# Patient Record
Sex: Male | Born: 1955
Health system: Southern US, Community
[De-identification: ages and names within clinical notes are randomized; demographics above are authoritative.]

## PROBLEM LIST (undated history)

## (undated) DIAGNOSIS — E119 Type 2 diabetes mellitus without complications: Secondary | ICD-10-CM

## (undated) DIAGNOSIS — J90 Pleural effusion, not elsewhere classified: Secondary | ICD-10-CM

## (undated) DIAGNOSIS — G8929 Other chronic pain: Secondary | ICD-10-CM

## (undated) DIAGNOSIS — M5441 Lumbago with sciatica, right side: Secondary | ICD-10-CM

## (undated) DIAGNOSIS — D151 Benign neoplasm of heart: Secondary | ICD-10-CM

## (undated) DIAGNOSIS — M25511 Pain in right shoulder: Secondary | ICD-10-CM

## (undated) DIAGNOSIS — N289 Disorder of kidney and ureter, unspecified: Secondary | ICD-10-CM

## (undated) DIAGNOSIS — E785 Hyperlipidemia, unspecified: Secondary | ICD-10-CM

## (undated) DIAGNOSIS — I10 Essential (primary) hypertension: Secondary | ICD-10-CM

## (undated) DIAGNOSIS — I4892 Unspecified atrial flutter: Secondary | ICD-10-CM

## (undated) HISTORY — DX: Other chronic pain: G89.29

## (undated) HISTORY — DX: Hyperlipidemia, unspecified: E78.5

## (undated) HISTORY — DX: Lumbago with sciatica, right side: M54.41

## (undated) HISTORY — DX: Type 2 diabetes mellitus without complications: E11.9

## (undated) HISTORY — DX: Pain in right shoulder: M25.511

## (undated) HISTORY — DX: Essential (primary) hypertension: I10

## (undated) HISTORY — DX: Benign neoplasm of heart: D15.1

## (undated) HISTORY — DX: Disorder of kidney and ureter, unspecified: N28.9

## (undated) HISTORY — PX: CARDIAC CATHETERIZATION: SHX172

## (undated) HISTORY — DX: Pleural effusion, not elsewhere classified: J90

## (undated) HISTORY — DX: Unspecified atrial flutter: I48.92

---

## 2000-01-28 ENCOUNTER — Ambulatory Visit (HOSPITAL_COMMUNITY): Admission: RE | Admit: 2000-01-28 | Discharge: 2000-01-28 | Payer: Self-pay | Admitting: *Deleted

## 2000-01-28 ENCOUNTER — Encounter: Payer: Self-pay | Admitting: *Deleted

## 2001-02-25 ENCOUNTER — Encounter: Payer: Self-pay | Admitting: Emergency Medicine

## 2001-02-25 ENCOUNTER — Encounter: Admission: RE | Admit: 2001-02-25 | Discharge: 2001-02-25 | Payer: Self-pay | Admitting: Emergency Medicine

## 2004-02-25 ENCOUNTER — Encounter: Admission: RE | Admit: 2004-02-25 | Discharge: 2004-02-25 | Payer: Self-pay | Admitting: Emergency Medicine

## 2008-11-19 ENCOUNTER — Encounter: Admission: RE | Admit: 2008-11-19 | Discharge: 2008-11-19 | Payer: Self-pay | Admitting: Cardiology

## 2008-11-30 ENCOUNTER — Ambulatory Visit (HOSPITAL_COMMUNITY): Admission: RE | Admit: 2008-11-30 | Discharge: 2008-11-30 | Payer: Self-pay | Admitting: Cardiology

## 2008-12-03 ENCOUNTER — Ambulatory Visit (HOSPITAL_COMMUNITY): Admission: RE | Admit: 2008-12-03 | Discharge: 2008-12-03 | Payer: Self-pay | Admitting: Cardiology

## 2008-12-03 ENCOUNTER — Encounter (INDEPENDENT_AMBULATORY_CARE_PROVIDER_SITE_OTHER): Payer: Self-pay | Admitting: Cardiology

## 2009-08-02 ENCOUNTER — Ambulatory Visit: Payer: Self-pay | Admitting: Gastroenterology

## 2009-08-12 ENCOUNTER — Encounter: Payer: Self-pay | Admitting: Gastroenterology

## 2009-08-12 ENCOUNTER — Ambulatory Visit: Payer: Self-pay | Admitting: Gastroenterology

## 2009-08-13 ENCOUNTER — Encounter: Payer: Self-pay | Admitting: Gastroenterology

## 2010-12-20 NOTE — Miscellaneous (Signed)
Summary: [facility]/PREVISIT/PREP  Clinical Lists Changes  Medications: Added new medication of MIRALAX   POWD (POLYETHYLENE GLYCOL 3350) As per prep  instructions. - Signed Added new medication of REGLAN 10 MG  TABS (METOCLOPRAMIDE HCL) As per prep instructions. - Signed Added new medication of DULCOLAX 5 MG  TBEC (BISACODYL) Day before procedure take 2 at 3pm and 2 at 8pm. - Signed Rx of MIRALAX   POWD (POLYETHYLENE GLYCOL 3350) As per prep  instructions.;  #255gm x 0;  Signed;  Entered by: Kyra Searles RN II;  Authorized by: Louis Meckel MD;  Method used: Electronically to [facility]*, [address], [place], [place]  [address], Ph: [phone] or [phone], Fax: [phone] Rx of REGLAN 10 MG  TABS (METOCLOPRAMIDE HCL) As per prep instructions.;  #2 x 0;  Signed;  Entered by: Kyra Searles RN II;  Authorized by: Louis Meckel MD;  Method used: Electronically to [facility]*, [address] [place], [place]  [address], Ph: [phone] or [phone], Fax: [phone] Rx of DULCOLAX 5 MG  TBEC (BISACODYL) Day before procedure take 2 at 3pm and 2 at 8pm.;  #4 x 0;  Signed;  Entered by: Kyra Searles RN II;  Authorized by: Louis Meckel MD;  Method used: Electronically to [facility]*, [address], [place], [place]  [address], Ph: [phone] or [phone], Fax: [phone] Observations: Added new observation of NKA: T (08/02/2009 13:32)    Prescriptions: DULCOLAX 5 MG  TBEC (BISACODYL) Day before procedure take 2 at 3pm and 2 at 8pm.  #4 x 0   Entered by:   Kyra Searles RN II   Authorized by:   Louis Meckel MD   Signed by:   Kyra Searles RN II on 08/02/2009   Method used:   Electronically to        [facility]* (retail)       [address] [place], [place]  [address]      Ph: [phone] or [phone]       Fax: [phone]   RxID:   [id number] REGLAN 10 MG  TABS (METOCLOPRAMIDE HCL) As per prep instructions.  #2 x 0   Entered by:   Kyra Searles RN II   Authorized by:   Louis Meckel MD   Signed by:   Kyra Searles RN II on 08/02/2009   Method used:   Electronically to        [facility]* (retail)       [address] [place], [place]  [address]       Ph: [phone] or [phone]       Fax: [phone]   RxID:   [id number] MIRALAX   POWD (POLYETHYLENE GLYCOL 3350) As per prep  instructions.  #255gm x 0   Entered by:   Kyra Searles RN II   Authorized by:   Louis Meckel MD   Signed by:   Kyra Searles RN II on 08/02/2009   Method used:   Electronically to        [facility]* (retail)       [phone] [place], [place]  [address]       Ph: [phone] or [phone]       Fax: [phone]   RxID:   [id number]

## 2011-02-24 LAB — GLUCOSE, CAPILLARY
Glucose-Capillary: 125 mg/dL — ABNORMAL HIGH (ref 70–99)
Glucose-Capillary: 95 mg/dL (ref 70–99)

## 2011-05-05 ENCOUNTER — Other Ambulatory Visit (HOSPITAL_COMMUNITY): Payer: Self-pay | Admitting: Cardiology

## 2011-05-05 DIAGNOSIS — I5189 Other ill-defined heart diseases: Secondary | ICD-10-CM

## 2011-05-09 ENCOUNTER — Other Ambulatory Visit (HOSPITAL_COMMUNITY): Payer: Self-pay | Admitting: Cardiology

## 2011-05-09 ENCOUNTER — Ambulatory Visit (HOSPITAL_COMMUNITY)
Admission: RE | Admit: 2011-05-09 | Discharge: 2011-05-09 | Disposition: A | Discharge status: Home / Self Care | Payer: 59 | Source: Ambulatory Visit | Attending: Cardiology | Admitting: Cardiology

## 2011-05-09 DIAGNOSIS — I5189 Other ill-defined heart diseases: Secondary | ICD-10-CM

## 2011-05-09 DIAGNOSIS — R222 Localized swelling, mass and lump, trunk: Secondary | ICD-10-CM

## 2011-05-09 DIAGNOSIS — R9389 Abnormal findings on diagnostic imaging of other specified body structures: Secondary | ICD-10-CM | POA: Insufficient documentation

## 2011-05-16 ENCOUNTER — Other Ambulatory Visit: Payer: Self-pay | Admitting: Cardiology

## 2011-05-16 ENCOUNTER — Ambulatory Visit
Admission: RE | Admit: 2011-05-16 | Discharge: 2011-05-16 | Disposition: A | Discharge status: Home / Self Care | Payer: 59 | Source: Ambulatory Visit | Attending: Cardiology | Admitting: Cardiology

## 2011-05-16 DIAGNOSIS — I119 Hypertensive heart disease without heart failure: Secondary | ICD-10-CM

## 2011-05-22 ENCOUNTER — Inpatient Hospital Stay (HOSPITAL_BASED_OUTPATIENT_CLINIC_OR_DEPARTMENT_OTHER)
Admission: RE | Admit: 2011-05-22 | Discharge: 2011-05-22 | Disposition: A | Discharge status: Home / Self Care | Payer: 59 | Source: Ambulatory Visit | Attending: Cardiology | Admitting: Cardiology

## 2011-05-22 DIAGNOSIS — D151 Benign neoplasm of heart: Secondary | ICD-10-CM | POA: Insufficient documentation

## 2011-05-22 DIAGNOSIS — E119 Type 2 diabetes mellitus without complications: Secondary | ICD-10-CM | POA: Insufficient documentation

## 2011-05-22 DIAGNOSIS — I119 Hypertensive heart disease without heart failure: Secondary | ICD-10-CM | POA: Insufficient documentation

## 2011-05-22 DIAGNOSIS — N289 Disorder of kidney and ureter, unspecified: Secondary | ICD-10-CM | POA: Insufficient documentation

## 2011-05-22 LAB — POCT I-STAT GLUCOSE
Glucose, Bld: 145 mg/dL — ABNORMAL HIGH (ref 70–99)
Operator id: 194801

## 2011-05-23 NOTE — Cardiovascular Report (Signed)
  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  He has known hypertensive heart disease and mild renal insufficiency as well as diabetes.  PROCEDURE:  Left heart catheterization with coronary angiograms and left ventriculogram.  SURGEON:  W. Viann Fish, MD  PROCEDURE IN DETAIL:  The [facility] and was prepped and draped in the usual manner. After Xylocaine anesthesia, a 4-French sheath was placed in the right femoral artery with a single anterior needle wall stick.  Versed 3 mg was used for sedation because of anxiety.  Angiograms were made using 4- French right and left coronary catheters and a pullback gradient was measured across the aortic valve.  The distal aortogram was done with a 4-French pigtail using 25 mL of contrast, a total of 60 mL of contrast was used for the procedure.  He tolerated it well and the sheath was removed in the holding area.  HEMODYNAMIC DATA:  Pullback pressure across the aortic valve shows an aortic pressure of 132/80, mean of 104.  Left ventricular pressure was 132/0-13.  ANGIOGRAPHIC DATA:  Left ventriculogram was not performed.  LVEDP was not elevated.  Distal aortogram reveals a widely patent aorta.  Renals appear grossly patent that are suboptimally visualized.  The distal runoff is good and there is no significant stenosis in the iliac vessels.  No evidence of abdominal aneurysm.  Coronary arteries arise and distribute normally.  There is very mild focal calcification noted in the proximal LAD at the distal left main junction.  The left main coronary artery is  normal.  The left anterior descending has a focal area of calcification with minimal narrowing at the origin of the LAD. The LAD is widely patent and is tortuous but contains no significant disease.  The circumflex contains a large intermediate branch that is mildly tortuous and contains no significant disease.  The circumflex proper contains a single marginal artery with scattered irregularities but no significant obstructive coronary disease is noted.  Right coronary artery is visualized in a single projection and is widely patent as a dominant vessel containing scattered irregularities but no significant obstructive disease.  IMPRESSION: 1. No significant obstructive coronary disease noted. 2. Widely patent distal aortogram.  RECOMMENDATIONS:  Surgery for myxoma.     Georga Hacking, M.D.     WST/MEDQ  D:  05/22/2011  T:  05/22/2011  Job:  [id number]  cc:   Salvatore Decent. Cornelius Moras, M.D. Gaspar Garbe, M.D.  Electronically Signed by Lacretia Nicks. Donnie Aho M.D. on 05/23/2011 05:10:41 PM

## 2011-05-29 ENCOUNTER — Encounter (INDEPENDENT_AMBULATORY_CARE_PROVIDER_SITE_OTHER): Payer: 59 | Admitting: Thoracic Surgery (Cardiothoracic Vascular Surgery)

## 2011-05-29 DIAGNOSIS — D487 Neoplasm of uncertain behavior of other specified sites: Secondary | ICD-10-CM

## 2011-05-30 NOTE — H&P (Signed)
HISTORY AND PHYSICAL EXAMINATION  May 29, 2011  Re:  Richard Barton, Richard Barton           DOB:  June 20, 1956  REFERRING PHYSICIAN:  Georga Hacking, MD  PRIMARY CARE PHYSICIAN:  Richard Garbe, MD  REASON FOR CONSULTATION:  Left atrial mass.  HISTORY OF PRESENT ILLNESS:  Richard Barton is a 55 year old African American male who is followed by Richard Barton, for severe hypertension with hypertensive heart disease.  He recently underwent a followup echocardiogram on May 03, 2011.  This confirmed the presence of stable left ventricular function with ejection fraction estimated 50%.  There was severe concentric left ventricular hypertrophy that was also stable in comparison with previous echocardiograms.  However, a small, mobile mass was seen in the left atrium attached to the atrial septum that had not been seen on previous echocardiograms and was suggestive of an atrial myxoma.  The patient subsequently underwent cardiac MRI on May 09, 2011.  This confirmed a small mass attached to the left atrial surface of the interatrial septum that measured 1.8 x 1.6 cm in diameter and appeared smooth and well-circumscribed, suggestive of an atrial myxoma.  No other abnormalities were noted.  Gadolinium contrast was not administered.  The patient was seen back in followup by Richard Barton, and later underwent elective cardiac catheterization on May 22, 2011.  This confirmed the absence of any significant coronary artery disease. Aortogram of the distal thoracic and abdominal aorta was notable for this absence of any significant aortoiliac occlusive disease.  The patient has now been referred to consider elective surgical resection of his left atrial mass, possibly through a right minithoracotomy approach.  REVIEW OF SYSTEMS:  GENERAL:  The patient reports normal appetite.  He has actually gained a little weight recently.  He is 5 feet, 10-1/2 inches tall, and weighs 230  pounds. CARDIAC:  The patient has stable symptoms of mild exertional shortness of breath.  He states that the symptoms are mild and somewhat sporadic. He has never had severe shortness of breath and he has never had any symptoms at rest.  He has never had any sort of pain in his chest.  He does not have any history of tachypalpitations or dizzy spells. RESPIRATORY:  Notable for some wheezing associated with physical exertion.  He denies productive cough, hemoptysis.  GASTROINTESTINAL: Negative.  The patient does take Nexium for reflux and his symptoms will recur quickly if he goes off of therapy.  Otherwise, he feels quite well and he has no difficulty swallowing.  Bowel function is regular.  He denies hematochezia, hematemesis, melena. GENITOURINARY:  The patient has known history of chronic renal insufficiency and is followed by Richard Barton.  He has no ongoing urinary symptoms. PERIPHERAL VASCULAR:  The patient does have pain in his lower legs and feet with walking that does not sound characteristic of claudication. The pain is worse when he first gets up and gets better as he moves on. He sometimes has similar pain when he is lying flat in bed and the pain gets better when he stands up. MUSCULOSKELETAL:  Notable for chronic arthritis and arthralgias particularly afflicting his left arm, both feet and both legs. NEUROLOGIC:  Negative.  The patient does have some occasional headaches. He denies any transient monocular blindness.  He denies any dizzy spells or syncope.  He denies any transient numbness or weakness involving either upper or lower extremity.  HEENT:  Notable for some gradual decrease in visual acuity  at both eyes has been slow and insidious, but seems to be worse recently.  Symptoms are bilateral. PSYCHIATRIC:  Negative. HEENT:  Negative.  PAST MEDICAL HISTORY: 1. Hypertension. 2. Type 2 diabetes mellitus. 3. Chronic renal insufficiency. 4.  Hyperlipidemia.  PAST SURGICAL HISTORY:  None.  FAMILY HISTORY:  Noncontributory.  SOCIAL HISTORY:  The patient is married and lives with his wife in [place].  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  He has a long history of heavy tobacco abuse, but he quit smoking 4 years ago.  Prior to that, he smoke between 2-1/2 and 3 packs of cigarettes daily.  He does not use alcohol regularly.  CURRENT MEDICATIONS: 1. Lantus insulin 70 units daily. 2. Glimepiride 4 mg daily. 3. Januvia 100 mg daily. 4. Bystolic 20 mg daily. 5. TriCor 145 mg daily. 6. Amlodipine 10 mg daily. 7. Nexium 40 mg daily. 8. Furosemide 40 mg daily. 9. Benazepril 40 mg daily. 10.Minoxidil 2.5 mg twice daily. 11.Mucinex as needed. 12.Benadryl as needed. 13.Excedrin as needed.  DRUG ALLERGIES:  None.  PHYSICAL EXAMINATION:  The patient is well-appearing, moderately obese African American male who appears his stated age in no acute distress. Blood pressure 154/89, pulse 72 regular, oxygen saturation 96% on room air.  HEENT exam is unrevealing.  The neck is supple.  There is no cervical nor supraclavicular lymphadenopathy.  There is no jugular venous distention.  No carotid bruits are noted.  Auscultation of the chest demonstrates clear breath sounds that are symmetrical bilaterally. No wheezes, rales, or rhonchi noted.  Cardiovascular exam reveals regular rate and rhythm.  No murmurs, rubs, or gallops are appreciated. The abdomen is moderately obese but soft, nontender.  There are no palpable masses.  Bowel sounds are present.  The extremities are warm and well perfused.  There is no lower extremity edema.  Femoral pulses are strong and palpable bilaterally.  Posterior tibial pulses are easily palpable in both lower legs at the ankle.  There is no sign of venous insufficiency.  Rectal and GU exams both deferred.  DIAGNOSTIC TEST:  Cardiac MRI performed on May 09, 2011, is reviewed. This confirms the presence of a small mass that appears to be emanating from the left atrial surface of the interatrial septum.  The mass is well-circumscribed and smooth with appearance suggestive of an atrial myxoma.  The possibility that this could represent lipoma cannot be ruled out, although the density on T1 and T2 images does not appear to be consistent with fat.  This does not appear to be consistent with mural thrombus within the left atrium.  This does not have characteristics suggestive of a malignancy or metastatic disease.  No other significant abnormalities were noted.  Cardiac catheterization performed by Richard Barton on May 22, 2011 is reviewed.  This demonstrates normal coronary artery anatomy with no significant coronary artery disease.  The abdominal aorta and iliac vessels appear free of any significant aortoiliac disease.  No other significant abnormalities were noted.  IMPRESSION:  Left atrial mass which is small and has characteristics suggestive of an atrial myxoma.  I agree that surgical resection would best be indicated both for diagnostic purposes and to prevent risk of stroke or other complications related to the mass.  A believe that the patient would be a good candidate for use of minithoracotomy approach.  PLAN:  I have discussed matters at length with the patient and his wife. The rationale for surgical  intervention has been discussed.  Alternative surgical approaches have been discussed and all of their questions have been addressed.  We tentatively planned to proceed with surgery on Friday, July 20,2 012.  The patient understands and accepts all potential associated risks and desire to proceed with surgery as described.  Salvatore Decent. Cornelius Moras, M.D. Electronically Signed  CHO/MEDQ  D:  05/29/2011  T:  05/30/2011  Job:  [id number]  cc:   Richard Barton, M.D. Richard Barton, M.D. Cecille Aver, M.D.

## 2011-06-07 ENCOUNTER — Encounter (HOSPITAL_COMMUNITY)
Admission: RE | Admit: 2011-06-07 | Discharge: 2011-06-07 | Disposition: A | Discharge status: Home / Self Care | Payer: 59 | Source: Ambulatory Visit | Attending: Thoracic Surgery (Cardiothoracic Vascular Surgery) | Admitting: Thoracic Surgery (Cardiothoracic Vascular Surgery)

## 2011-06-07 ENCOUNTER — Other Ambulatory Visit: Payer: Self-pay | Admitting: Thoracic Surgery (Cardiothoracic Vascular Surgery)

## 2011-06-07 ENCOUNTER — Ambulatory Visit (HOSPITAL_COMMUNITY)
Admission: RE | Admit: 2011-06-07 | Discharge: 2011-06-07 | Disposition: A | Discharge status: Home / Self Care | Payer: 59 | Source: Ambulatory Visit | Attending: Thoracic Surgery (Cardiothoracic Vascular Surgery) | Admitting: Thoracic Surgery (Cardiothoracic Vascular Surgery)

## 2011-06-07 DIAGNOSIS — I6529 Occlusion and stenosis of unspecified carotid artery: Secondary | ICD-10-CM | POA: Insufficient documentation

## 2011-06-07 DIAGNOSIS — Z01811 Encounter for preprocedural respiratory examination: Secondary | ICD-10-CM

## 2011-06-07 DIAGNOSIS — Z01810 Encounter for preprocedural cardiovascular examination: Secondary | ICD-10-CM

## 2011-06-07 DIAGNOSIS — R220 Localized swelling, mass and lump, head: Secondary | ICD-10-CM | POA: Insufficient documentation

## 2011-06-07 DIAGNOSIS — Z01812 Encounter for preprocedural laboratory examination: Secondary | ICD-10-CM | POA: Insufficient documentation

## 2011-06-07 LAB — URINALYSIS, ROUTINE W REFLEX MICROSCOPIC
Bilirubin Urine: NEGATIVE
Glucose, UA: NEGATIVE mg/dL
Hgb urine dipstick: NEGATIVE
Ketones, ur: NEGATIVE mg/dL
Leukocytes, UA: NEGATIVE
Nitrite: NEGATIVE
Protein, ur: NEGATIVE mg/dL
Specific Gravity, Urine: 1.017 (ref 1.005–1.030)
Urobilinogen, UA: 0.2 mg/dL (ref 0.0–1.0)
pH: 5 (ref 5.0–8.0)

## 2011-06-07 LAB — ABO/RH: ABO/RH(D): A POS

## 2011-06-07 LAB — COMPREHENSIVE METABOLIC PANEL
ALT: 35 U/L (ref 0–53)
AST: 30 U/L (ref 0–37)
Albumin: 4.5 g/dL (ref 3.5–5.2)
Alkaline Phosphatase: 50 U/L (ref 39–117)
BUN: 21 mg/dL (ref 6–23)
CO2: 23 mEq/L (ref 19–32)
Calcium: 10 mg/dL (ref 8.4–10.5)
Chloride: 106 mEq/L (ref 96–112)
Creatinine, Ser: 1.76 mg/dL — ABNORMAL HIGH (ref 0.50–1.35)
GFR calc Af Amer: 49 mL/min — ABNORMAL LOW (ref >60)
GFR calc non Af Amer: 41 mL/min — ABNORMAL LOW (ref >60)
Glucose, Bld: 126 mg/dL — ABNORMAL HIGH (ref 70–99)
Potassium: 4.4 mEq/L (ref 3.5–5.1)
Sodium: 140 mEq/L (ref 135–145)
Total Bilirubin: 0.3 mg/dL (ref 0.3–1.2)
Total Protein: 7.8 g/dL (ref 6.0–8.3)

## 2011-06-07 LAB — BLOOD GAS, ARTERIAL
Acid-Base Excess: 1.4 mmol/L (ref 0.0–2.0)
Bicarbonate: 25.1 mEq/L — ABNORMAL HIGH (ref 20.0–24.0)
Drawn by: 344381
FIO2: 0.21 %
O2 Saturation: 97.1 %
Patient temperature: 98.6
TCO2: 26.2 mmol/L (ref 0–100)
pCO2 arterial: 37.5 mmHg (ref 35.0–45.0)
pH, Arterial: 7.441 (ref 7.350–7.450)
pO2, Arterial: 86 mmHg (ref 80.0–100.0)

## 2011-06-07 LAB — SURGICAL PCR SCREEN
MRSA, PCR: NEGATIVE
Staphylococcus aureus: NEGATIVE

## 2011-06-07 LAB — CBC
HCT: 40.7 % (ref 39.0–52.0)
Hemoglobin: 14.9 g/dL (ref 13.0–17.0)
MCH: 27.5 pg (ref 26.0–34.0)
MCHC: 36.6 g/dL — ABNORMAL HIGH (ref 30.0–36.0)
MCV: 75.2 fL — ABNORMAL LOW (ref 78.0–100.0)
Platelets: 306 10*3/uL (ref 150–400)
RBC: 5.41 MIL/uL (ref 4.22–5.81)
RDW: 14.6 % (ref 11.5–15.5)
WBC: 5.6 10*3/uL (ref 4.0–10.5)

## 2011-06-07 LAB — PROTIME-INR
INR: 1 (ref 0.00–1.49)
Prothrombin Time: 13.4 seconds (ref 11.6–15.2)

## 2011-06-07 LAB — TYPE AND SCREEN
ABO/RH(D): A POS
Antibody Screen: NEGATIVE

## 2011-06-07 LAB — APTT: aPTT: 30 seconds (ref 24–37)

## 2011-06-08 LAB — HEMOGLOBIN A1C
Hgb A1c MFr Bld: 9.4 % — ABNORMAL HIGH (ref <5.7)
Mean Plasma Glucose: 223 mg/dL — ABNORMAL HIGH (ref <117)

## 2011-06-09 ENCOUNTER — Other Ambulatory Visit: Payer: Self-pay | Admitting: Thoracic Surgery (Cardiothoracic Vascular Surgery)

## 2011-06-09 ENCOUNTER — Inpatient Hospital Stay (HOSPITAL_COMMUNITY): Payer: 59

## 2011-06-09 ENCOUNTER — Inpatient Hospital Stay (HOSPITAL_COMMUNITY)
Admission: RE | Admit: 2011-06-09 | Discharge: 2011-06-15 | DRG: 229 | Disposition: A | Discharge status: Home Health | Payer: 59 | Source: Ambulatory Visit | Attending: Thoracic Surgery (Cardiothoracic Vascular Surgery) | Admitting: Thoracic Surgery (Cardiothoracic Vascular Surgery)

## 2011-06-09 DIAGNOSIS — Y838 Other surgical procedures as the cause of abnormal reaction of the patient, or of later complication, without mention of misadventure at the time of the procedure: Secondary | ICD-10-CM | POA: Diagnosis not present

## 2011-06-09 DIAGNOSIS — J449 Chronic obstructive pulmonary disease, unspecified: Secondary | ICD-10-CM | POA: Diagnosis present

## 2011-06-09 DIAGNOSIS — N189 Chronic kidney disease, unspecified: Secondary | ICD-10-CM | POA: Diagnosis present

## 2011-06-09 DIAGNOSIS — Z7982 Long term (current) use of aspirin: Secondary | ICD-10-CM

## 2011-06-09 DIAGNOSIS — I498 Other specified cardiac arrhythmias: Secondary | ICD-10-CM | POA: Diagnosis not present

## 2011-06-09 DIAGNOSIS — I059 Rheumatic mitral valve disease, unspecified: Secondary | ICD-10-CM | POA: Diagnosis present

## 2011-06-09 DIAGNOSIS — I519 Heart disease, unspecified: Secondary | ICD-10-CM | POA: Diagnosis not present

## 2011-06-09 DIAGNOSIS — N289 Disorder of kidney and ureter, unspecified: Secondary | ICD-10-CM | POA: Diagnosis not present

## 2011-06-09 DIAGNOSIS — D487 Neoplasm of uncertain behavior of other specified sites: Secondary | ICD-10-CM

## 2011-06-09 DIAGNOSIS — Z794 Long term (current) use of insulin: Secondary | ICD-10-CM

## 2011-06-09 DIAGNOSIS — I1310 Hypertensive heart and chronic kidney disease without heart failure, with stage 1 through stage 4 chronic kidney disease, or unspecified chronic kidney disease: Secondary | ICD-10-CM | POA: Diagnosis present

## 2011-06-09 DIAGNOSIS — K219 Gastro-esophageal reflux disease without esophagitis: Secondary | ICD-10-CM | POA: Diagnosis present

## 2011-06-09 DIAGNOSIS — D151 Benign neoplasm of heart: Principal | ICD-10-CM | POA: Diagnosis present

## 2011-06-09 DIAGNOSIS — E119 Type 2 diabetes mellitus without complications: Secondary | ICD-10-CM | POA: Diagnosis present

## 2011-06-09 DIAGNOSIS — D62 Acute posthemorrhagic anemia: Secondary | ICD-10-CM | POA: Diagnosis not present

## 2011-06-09 DIAGNOSIS — J4489 Other specified chronic obstructive pulmonary disease: Secondary | ICD-10-CM | POA: Diagnosis present

## 2011-06-09 HISTORY — PX: OTHER SURGICAL HISTORY: SHX169

## 2011-06-09 LAB — CBC
HCT: 38.1 % — ABNORMAL LOW (ref 39.0–52.0)
HCT: 39 % (ref 39.0–52.0)
Hemoglobin: 13.3 g/dL (ref 13.0–17.0)
Hemoglobin: 13.9 g/dL (ref 13.0–17.0)
MCH: 26.4 pg (ref 26.0–34.0)
MCH: 26.9 pg (ref 26.0–34.0)
MCHC: 34.9 g/dL (ref 30.0–36.0)
MCHC: 35.6 g/dL (ref 30.0–36.0)
MCV: 75.7 fL — ABNORMAL LOW (ref 78.0–100.0)
MCV: 75.4 fL — ABNORMAL LOW (ref 78.0–100.0)
Platelets: 217 10*3/uL (ref 150–400)
Platelets: 276 10*3/uL (ref 150–400)
RBC: 5.03 MIL/uL (ref 4.22–5.81)
RBC: 5.17 MIL/uL (ref 4.22–5.81)
RDW: 14.6 % (ref 11.5–15.5)
RDW: 14.6 % (ref 11.5–15.5)
WBC: 11.7 10*3/uL — ABNORMAL HIGH (ref 4.0–10.5)
WBC: 18.4 10*3/uL — ABNORMAL HIGH (ref 4.0–10.5)

## 2011-06-09 LAB — GLUCOSE, CAPILLARY
Glucose-Capillary: 155 mg/dL — ABNORMAL HIGH (ref 70–99)
Glucose-Capillary: 121 mg/dL — ABNORMAL HIGH (ref 70–99)
Glucose-Capillary: 107 mg/dL — ABNORMAL HIGH (ref 70–99)
Glucose-Capillary: 124 mg/dL — ABNORMAL HIGH (ref 70–99)
Glucose-Capillary: 127 mg/dL — ABNORMAL HIGH (ref 70–99)
Glucose-Capillary: 135 mg/dL — ABNORMAL HIGH (ref 70–99)
Glucose-Capillary: 120 mg/dL — ABNORMAL HIGH (ref 70–99)

## 2011-06-09 LAB — POCT I-STAT 3, ART BLOOD GAS (G3+)
Acid-base deficit: 3 mmol/L — ABNORMAL HIGH (ref 0.0–2.0)
Acid-base deficit: 4 mmol/L — ABNORMAL HIGH (ref 0.0–2.0)
Acid-base deficit: 3 mmol/L — ABNORMAL HIGH (ref 0.0–2.0)
Acid-base deficit: 2 mmol/L (ref 0.0–2.0)
Acid-base deficit: 3 mmol/L — ABNORMAL HIGH (ref 0.0–2.0)
Bicarbonate: 23.5 mEq/L (ref 20.0–24.0)
Bicarbonate: 24.3 mEq/L — ABNORMAL HIGH (ref 20.0–24.0)
Bicarbonate: 24 mEq/L (ref 20.0–24.0)
Bicarbonate: 24.9 mEq/L — ABNORMAL HIGH (ref 20.0–24.0)
Bicarbonate: 23.1 mEq/L (ref 20.0–24.0)
O2 Saturation: 100 %
O2 Saturation: 98 %
O2 Saturation: 96 %
O2 Saturation: 97 %
O2 Saturation: 98 %
Patient temperature: 35.8
Patient temperature: 37
Patient temperature: 37.3
TCO2: 25 mmol/L (ref 0–100)
TCO2: 26 mmol/L (ref 0–100)
TCO2: 25 mmol/L (ref 0–100)
TCO2: 26 mmol/L (ref 0–100)
TCO2: 24 mmol/L (ref 0–100)
pCO2 arterial: 45.4 mmHg — ABNORMAL HIGH (ref 35.0–45.0)
pCO2 arterial: 59.3 mmHg (ref 35.0–45.0)
pCO2 arterial: 44.8 mmHg (ref 35.0–45.0)
pCO2 arterial: 50.8 mmHg — ABNORMAL HIGH (ref 35.0–45.0)
pCO2 arterial: 43.4 mmHg (ref 35.0–45.0)
pH, Arterial: 7.321 — ABNORMAL LOW (ref 7.350–7.450)
pH, Arterial: 7.221 — ABNORMAL LOW (ref 7.350–7.450)
pH, Arterial: 7.33 — ABNORMAL LOW (ref 7.350–7.450)
pH, Arterial: 7.299 — ABNORMAL LOW (ref 7.350–7.450)
pH, Arterial: 7.336 — ABNORMAL LOW (ref 7.350–7.450)
pO2, Arterial: 261 mmHg — ABNORMAL HIGH (ref 80.0–100.0)
pO2, Arterial: 133 mmHg — ABNORMAL HIGH (ref 80.0–100.0)
pO2, Arterial: 83 mmHg (ref 80.0–100.0)
pO2, Arterial: 104 mmHg — ABNORMAL HIGH (ref 80.0–100.0)
pO2, Arterial: 108 mmHg — ABNORMAL HIGH (ref 80.0–100.0)

## 2011-06-09 LAB — POCT I-STAT 4, (NA,K, GLUC, HGB,HCT)
Glucose, Bld: 209 mg/dL — ABNORMAL HIGH (ref 70–99)
Glucose, Bld: 127 mg/dL — ABNORMAL HIGH (ref 70–99)
Glucose, Bld: 149 mg/dL — ABNORMAL HIGH (ref 70–99)
Glucose, Bld: 163 mg/dL — ABNORMAL HIGH (ref 70–99)
Glucose, Bld: 207 mg/dL — ABNORMAL HIGH (ref 70–99)
Glucose, Bld: 156 mg/dL — ABNORMAL HIGH (ref 70–99)
HCT: 39 % (ref 39.0–52.0)
HCT: 29 % — ABNORMAL LOW (ref 39.0–52.0)
HCT: 32 % — ABNORMAL LOW (ref 39.0–52.0)
HCT: 33 % — ABNORMAL LOW (ref 39.0–52.0)
HCT: 32 % — ABNORMAL LOW (ref 39.0–52.0)
HCT: 42 % (ref 39.0–52.0)
Hemoglobin: 13.3 g/dL (ref 13.0–17.0)
Hemoglobin: 10.9 g/dL — ABNORMAL LOW (ref 13.0–17.0)
Hemoglobin: 9.9 g/dL — ABNORMAL LOW (ref 13.0–17.0)
Hemoglobin: 11.2 g/dL — ABNORMAL LOW (ref 13.0–17.0)
Hemoglobin: 10.9 g/dL — ABNORMAL LOW (ref 13.0–17.0)
Hemoglobin: 14.3 g/dL (ref 13.0–17.0)
Potassium: 4 mEq/L (ref 3.5–5.1)
Potassium: 3.1 mEq/L — ABNORMAL LOW (ref 3.5–5.1)
Potassium: 4.7 mEq/L (ref 3.5–5.1)
Potassium: 5.7 mEq/L — ABNORMAL HIGH (ref 3.5–5.1)
Potassium: 5.8 mEq/L — ABNORMAL HIGH (ref 3.5–5.1)
Potassium: 4.4 mEq/L (ref 3.5–5.1)
Sodium: 139 mEq/L (ref 135–145)
Sodium: 135 mEq/L (ref 135–145)
Sodium: 145 mEq/L (ref 135–145)
Sodium: 137 mEq/L (ref 135–145)
Sodium: 136 mEq/L (ref 135–145)
Sodium: 139 mEq/L (ref 135–145)

## 2011-06-09 LAB — POCT I-STAT, CHEM 8
BUN: 23 mg/dL (ref 6–23)
Calcium, Ion: 1.25 mmol/L (ref 1.12–1.32)
Chloride: 111 mEq/L (ref 96–112)
Creatinine, Ser: 1.7 mg/dL — ABNORMAL HIGH (ref 0.50–1.35)
Glucose, Bld: 129 mg/dL — ABNORMAL HIGH (ref 70–99)
HCT: 40 % (ref 39.0–52.0)
Hemoglobin: 13.6 g/dL (ref 13.0–17.0)
Potassium: 3.8 mEq/L (ref 3.5–5.1)
Sodium: 138 mEq/L (ref 135–145)
TCO2: 23 mmol/L (ref 0–100)

## 2011-06-09 LAB — PROTIME-INR
INR: 1.34 (ref 0.00–1.49)
Prothrombin Time: 16.8 seconds — ABNORMAL HIGH (ref 11.6–15.2)

## 2011-06-09 LAB — PLATELET COUNT: Platelets: 240 10*3/uL (ref 150–400)

## 2011-06-09 LAB — MAGNESIUM: Magnesium: 3.1 mg/dL — ABNORMAL HIGH (ref 1.5–2.5)

## 2011-06-09 LAB — CREATININE, SERUM
Creatinine, Ser: 1.7 mg/dL — ABNORMAL HIGH (ref 0.50–1.35)
GFR calc Af Amer: 51 mL/min — ABNORMAL LOW (ref >60)
GFR calc non Af Amer: 43 mL/min — ABNORMAL LOW (ref >60)

## 2011-06-09 LAB — HEMOGLOBIN AND HEMATOCRIT, BLOOD
HCT: 30.6 % — ABNORMAL LOW (ref 39.0–52.0)
Hemoglobin: 10.9 g/dL — ABNORMAL LOW (ref 13.0–17.0)

## 2011-06-09 LAB — APTT: aPTT: 31 seconds (ref 24–37)

## 2011-06-10 ENCOUNTER — Inpatient Hospital Stay (HOSPITAL_COMMUNITY): Payer: 59

## 2011-06-10 DIAGNOSIS — E1165 Type 2 diabetes mellitus with hyperglycemia: Secondary | ICD-10-CM

## 2011-06-10 DIAGNOSIS — IMO0001 Reserved for inherently not codable concepts without codable children: Secondary | ICD-10-CM

## 2011-06-10 LAB — GLUCOSE, CAPILLARY
Glucose-Capillary: 104 mg/dL — ABNORMAL HIGH (ref 70–99)
Glucose-Capillary: 104 mg/dL — ABNORMAL HIGH (ref 70–99)
Glucose-Capillary: 108 mg/dL — ABNORMAL HIGH (ref 70–99)
Glucose-Capillary: 108 mg/dL — ABNORMAL HIGH (ref 70–99)
Glucose-Capillary: 114 mg/dL — ABNORMAL HIGH (ref 70–99)
Glucose-Capillary: 134 mg/dL — ABNORMAL HIGH (ref 70–99)
Glucose-Capillary: 145 mg/dL — ABNORMAL HIGH (ref 70–99)
Glucose-Capillary: 148 mg/dL — ABNORMAL HIGH (ref 70–99)
Glucose-Capillary: 160 mg/dL — ABNORMAL HIGH (ref 70–99)
Glucose-Capillary: 161 mg/dL — ABNORMAL HIGH (ref 70–99)
Glucose-Capillary: 187 mg/dL — ABNORMAL HIGH (ref 70–99)
Glucose-Capillary: 88 mg/dL (ref 70–99)
Glucose-Capillary: 91 mg/dL (ref 70–99)
Glucose-Capillary: 97 mg/dL (ref 70–99)
Glucose-Capillary: 99 mg/dL (ref 70–99)

## 2011-06-10 LAB — CBC
HCT: 36.6 % — ABNORMAL LOW (ref 39.0–52.0)
HCT: 34.8 % — ABNORMAL LOW (ref 39.0–52.0)
Hemoglobin: 13 g/dL (ref 13.0–17.0)
Hemoglobin: 12.3 g/dL — ABNORMAL LOW (ref 13.0–17.0)
MCH: 27 pg (ref 26.0–34.0)
MCH: 27.1 pg (ref 26.0–34.0)
MCHC: 35.5 g/dL (ref 30.0–36.0)
MCHC: 35.3 g/dL (ref 30.0–36.0)
MCV: 75.9 fL — ABNORMAL LOW (ref 78.0–100.0)
MCV: 76.7 fL — ABNORMAL LOW (ref 78.0–100.0)
Platelets: 228 10*3/uL (ref 150–400)
Platelets: 229 10*3/uL (ref 150–400)
RBC: 4.82 MIL/uL (ref 4.22–5.81)
RBC: 4.54 MIL/uL (ref 4.22–5.81)
RDW: 14.9 % (ref 11.5–15.5)
RDW: 15.2 % (ref 11.5–15.5)
WBC: 14.8 10*3/uL — ABNORMAL HIGH (ref 4.0–10.5)
WBC: 16.3 10*3/uL — ABNORMAL HIGH (ref 4.0–10.5)

## 2011-06-10 LAB — POCT I-STAT, CHEM 8
BUN: 36 mg/dL — ABNORMAL HIGH (ref 6–23)
Calcium, Ion: 1.11 mmol/L — ABNORMAL LOW (ref 1.12–1.32)
Chloride: 99 mEq/L (ref 96–112)
Creatinine, Ser: 2.3 mg/dL — ABNORMAL HIGH (ref 0.50–1.35)
Glucose, Bld: 185 mg/dL — ABNORMAL HIGH (ref 70–99)
HCT: 39 % (ref 39.0–52.0)
Hemoglobin: 13.3 g/dL (ref 13.0–17.0)
Potassium: 4.2 mEq/L (ref 3.5–5.1)
Sodium: 137 mEq/L (ref 135–145)
TCO2: 24 mmol/L (ref 0–100)

## 2011-06-10 LAB — BASIC METABOLIC PANEL
BUN: 28 mg/dL — ABNORMAL HIGH (ref 6–23)
CO2: 25 mEq/L (ref 19–32)
Calcium: 8.3 mg/dL — ABNORMAL LOW (ref 8.4–10.5)
Chloride: 104 mEq/L (ref 96–112)
Creatinine, Ser: 1.73 mg/dL — ABNORMAL HIGH (ref 0.50–1.35)
GFR calc Af Amer: 50 mL/min — ABNORMAL LOW (ref >60)
GFR calc non Af Amer: 42 mL/min — ABNORMAL LOW (ref >60)
Glucose, Bld: 148 mg/dL — ABNORMAL HIGH (ref 70–99)
Potassium: 4.1 mEq/L (ref 3.5–5.1)
Sodium: 138 mEq/L (ref 135–145)

## 2011-06-10 LAB — CREATININE, SERUM
Creatinine, Ser: 2.05 mg/dL — ABNORMAL HIGH (ref 0.50–1.35)
GFR calc Af Amer: 41 mL/min — ABNORMAL LOW (ref >60)
GFR calc non Af Amer: 34 mL/min — ABNORMAL LOW (ref >60)

## 2011-06-10 LAB — POCT I-STAT 4, (NA,K, GLUC, HGB,HCT)
Glucose, Bld: 183 mg/dL — ABNORMAL HIGH (ref 70–99)
HCT: 43 % (ref 39.0–52.0)
Hemoglobin: 14.6 g/dL (ref 13.0–17.0)
Potassium: 3.8 mEq/L (ref 3.5–5.1)
Sodium: 140 mEq/L (ref 135–145)

## 2011-06-10 LAB — MAGNESIUM
Magnesium: 2.8 mg/dL — ABNORMAL HIGH (ref 1.5–2.5)
Magnesium: 2.7 mg/dL — ABNORMAL HIGH (ref 1.5–2.5)

## 2011-06-10 NOTE — Op Note (Signed)
NAMEJASMOND, Richard Barton                 ACCOUNT NO.:  [id number]  MEDICAL RECORD NO.:  [id number]  LOCATION:  2315                         FACILITY:  [facility]  PHYSICIAN:  Salvatore Decent. Cornelius Moras, M.D. DATE OF BIRTH:  Dec 28, 1955  DATE OF PROCEDURE:  06/09/2011 DATE OF DISCHARGE:                              OPERATIVE REPORT   PREOPERATIVE DIAGNOSIS:  Left atrial mass.  POSTOPERATIVE DIAGNOSIS:  Left atrial myxoma.  PROCEDURE:  Right miniature thoracotomy for resection of left atrial myxoma.  SURGEON:  Salvatore Decent. Cornelius Moras, MD  ASSISTANT:  Rowe Clack, PAC  ANESTHESIOLOGIST:  Germaine Pomfret, MD  BRIEF CLINICAL NOTE:  The patient is a 55 year old African American male who is followed by Dr. Viann Fish for severe hypertension and hypertensive heart disease.  A 2D echocardiogram performed in June 2012, revealed a mass in the left atrium, suspicious for atrial myxoma. Cardiac MRI confirmed the presence of a small, well-circumscribed mass that appeared to be coming off the interatrial septum with anatomical characteristics, consistent with atrial myxoma.  Cardiac catheterization was performed, revealing no significant coronary artery disease.  A full consultation note has been dictated previously.  The patient has been counseled regarding the indications, risks, and potential benefits of the surgery.  Alternative treatment strategies have been discussed.  The patient understands and accepts all associated risks of surgery and desires to proceed as described.  OPERATIVE FINDINGS: 1. Small mass arising from the inferior rim of the fossa ovalis with     preliminary frozen section histology, consistent with benign atrial     myxoma. 2. Normal left ventricular systolic function. 3. Moderate left ventricular hypertrophy. 4. Mild mitral regurgitation.  OPERATIVE PROCEDURE IN DETAIL:  The patient was brought to the operating room on the above-mentioned date and central monitoring was  established by the Anesthesia Team under the care and direction of Dr. Jairo Ben.  Specifically, a Swan-Ganz catheter was placed through the left internal jugular approach.  A radial arterial line was placed. Intravenous antibiotics were administered.  The patient was placed in the supine position on the operating table.  General endotracheal anesthesia was induced uneventfully.  The patient was initially intubated with a dual-lumen endotracheal tube.  A Foley catheter was placed.  The patient was positioned with a soft roll behind the right scapula and the neck gently extended and turned towards the left.  The patient's right neck, chest, abdomen, both groins, and both lower extremities were prepared and draped in sterile manner.  Baseline transesophageal echocardiogram was performed by Dr. Jean Rosenthal. This confirmed the presence of mass emanating from the inferior rim of the fossa ovalis which is smooth and well circumscribed with anatomical characteristics, consistent with atrial myxoma.  There is mild central mitral regurgitation.  There is normal left ventricular systolic function with moderate left ventricular hypertrophy.  No other significant abnormalities were noted.  A small incision was made in the right inguinal crease and the anterior surface of the right common femoral artery and right common femoral vein were dissected through the incision.  The femoral artery is normal in appearance.  Single lung ventilation is begun.  Right miniature anterolateral thoracotomy incision was performed.  The incision was placed just above and just lateral to the right nipple.  The pectoralis major muscle was initially retracted medially and preserved.  The right pleural space was entered through the third intercostal space.  Due to the patient's extremely large and well-developed pectoralis musculature, the incision was extended medially to facilitate exposure without need to divide  one of the ribs.  A soft tissue retractor was placed.  Two 11-mm ports are placed inferiorly through separate stab incisions.  The right pleural space was insufflated continuously with carbon dioxide gas through the posterior port.  A pledgeted sutures placed in the dome of the right hemidiaphragm and retracted inferiorly to facilitate exposure.  A longitudinal incision was made in the pericardium 2 cm anterior to the phrenic nerve.  Silk traction sutures were placed on either side for exposure.  The patient was placed in Trendelenburg position.  The right internal jugular vein was cannulated with Seldinger technique and a flexible guidewire was advanced into the right atrium.  The patient was heparinized systemically.  Pursestring sutures were placed on the anterior surface of the right common femoral vein and the right common femoral artery.  The right common femoral vein was cannulated with Seldinger technique and transesophageal echocardiogram guidance was utilized to pass the guidewire up through the inferior vena cava until the guidewire passes through the right atrium up in to the superior vena cava.  The femoral vein was dilated with serial dilators and cannulated with a 22-French long femoral venous cannula.  The femoral artery was cannulated with Seldinger technique and flexible guidewire was advanced until it can be appreciated intraluminally in the descending thoracic aorta.  The femoral artery was dilated with serial dilators and cannulated with an 18-French femoral arterial cannula.  The right internal jugular vein was dilated with serial dilators and cannulated with a 14-French pediatric femoral venous cannula.  Cardiopulmonary bypass was begun.  Vacuum assist venous drainage was utilized.  Venous drainage and exposure is notably excellent.  Umbilical tapes are placed around the superior vena cava and the inferior vena cava.  An antegrade cardioplegic cannula was placed  directly in the ascending aorta.  The patient was cooled to 30 degrees systemic temperature.  The aortic crossclamp was applied and cold blood cardioplegia was delivered in antegrade fashion through the aortic root. The initial cardioplegic arrest was rapid with early diastolic arrest. Repeat doses of cardioplegia are administered every 20-30 minutes throughout the entire crossclamp portion of the operation to maintain completely flat electrocardiogram.  A small left atriotomy incision was performed posteriorly through the interatrial groove.  Through this incision, the left atrial mass is identified from within the left atrium and its position along the inferior rim of the fossa ovalis was clearly identified.  A sump drain was placed through the left atrium into the mitral valve to serve as a vent.  The inferior vena cava cannula was pulled down until the tip was just below the entry of the right atrium and the umbilical tapes were secured.  An oblique right atriotomy incision was performed.  An incision was made in the fossa ovalis along the superior rim just above lateral to the left atrial mass.  The left atrial mass is now excised completely along with a circular button of the interatrial septum which comprises the entire base of the stalk of the mass itself.  Once the mass was removed, it is sent to Pathology for routine histology and frozen section.  Preliminary frozen section histology confirms  findings consistent with benign atrial myxoma.  After the mass has been excised, a circular defect in the interatrial septum is carefully inspected to make sure that no residual myxomas left behind.  A portion of the patient's autologous pericardium was soaked in 0.65% glutaraldehyde solution for 3 minutes and then rinsed per protocol.  A portion of this autologous pericardium was then trimmed to an elliptical shape and this is utilized to close the defect in the interatrial septum.  The  patch is sewn in place with running 4-0 Prolene suture.  The left atriotomy incision was closed with a two-layer closure of running 3-0 Prolene suture.  One final dose of warm hot shot cardioplegia is administered.  The aortic crossclamp was removed after a total crossclamp time of 60 minutes.  The heart began to beat spontaneously without need for cardioversion.  The right atriotomy incision was closed with a two-layer closure of running 3-0 Prolene suture.  The umbilical tapes were removed.  The lungs were ventilated and the heart allowed to fill after which time the left ventricular vent was removed.  The lungs were ventilated and heart allowed to fill while transesophageal echocardiogram was inspected.  The anterior atrial septum appears perfectly intact with no sign of any residual interatrial communication.  The patient is rewarmed to 37 degrees centigrade temperature.  The antegrade cardioplegic cannula was removed.  The patient was weaned from cardiopulmonary bypass without difficulty after placement of epicardial pacing wires on the right ventricular free wall into the right atrial appendage.  The patient's rhythm at separation from bypass was junctional bradycardia.  AV sequential pacing was employed.  No inotropic support was required.  Total cardiopulmonary bypass time for the operation was 115 minutes.  Followup transesophageal echocardiogram performed by Dr. Jean Rosenthal after separation from bypass demonstrates normal left ventricular function.  The interatrial septum appears perfectly intact with no sign of any interatrial shunt or communication. There remains mild mitral regurgitation.  There is mild tricuspid regurgitation.  No other abnormalities are noted.  Protamine was administered to reverse the anticoagulation.  The femoral arterial and venous cannula are removed uneventfully.  There is a palpable pulse in the distal right femoral artery.  The right internal jugular  cannula was removed and manual pressure was held on the right neck for 30 minutes.  Single lung ventilation was begun.  The left atriotomy incision was inspected for hemostasis.  The pericardial space was drained with a 28- French Bard chest tube placed through the anterior port incision.  The pericardium was closed laterally using a patch of CorMatrix bovine submucosal tissue patch.  The right pleural space was irrigated with saline solution and inspected for hemostasis.  The right pleural space was drained with a 28-French Bard chest tube placed through the posterior port incision.  The minithoracotomy was closed in multiple layers in routine fashion.  The groin incision was closed in multiple layers in routine fashion.  All skin incisions were closed with subcuticular skin closures.  The patient tolerated the procedure well.  The patient is reintubated with a single-lumen endotracheal tube and transported to the surgical intensive care unit in stable condition.  There are no intraoperative complications.  All sponge, instrument, and needle counts were verified correct at the completion of the operation.  No blood products were administered.     Salvatore Decent. Cornelius Moras, M.D.     CHO/MEDQ  D:  06/09/2011  T:  06/10/2011  Job:  [id number]  cc:   Lacretia Nicks. Viann Fish,  M.D. Gaspar Garbe, M.D. Cecille Aver, M.D.  Electronically Signed by Tressie Stalker M.D. on 06/10/2011 03:07:51 PM

## 2011-06-11 ENCOUNTER — Inpatient Hospital Stay (HOSPITAL_COMMUNITY): Payer: 59

## 2011-06-11 LAB — BASIC METABOLIC PANEL
BUN: 41 mg/dL — ABNORMAL HIGH (ref 6–23)
CO2: 28 mEq/L (ref 19–32)
Calcium: 8.1 mg/dL — ABNORMAL LOW (ref 8.4–10.5)
Chloride: 99 mEq/L (ref 96–112)
Creatinine, Ser: 2.25 mg/dL — ABNORMAL HIGH (ref 0.50–1.35)
GFR calc Af Amer: 37 mL/min — ABNORMAL LOW (ref >60)
GFR calc non Af Amer: 31 mL/min — ABNORMAL LOW (ref >60)
Glucose, Bld: 123 mg/dL — ABNORMAL HIGH (ref 70–99)
Potassium: 3.8 mEq/L (ref 3.5–5.1)
Sodium: 135 mEq/L (ref 135–145)

## 2011-06-11 LAB — GLUCOSE, CAPILLARY
Glucose-Capillary: 124 mg/dL — ABNORMAL HIGH (ref 70–99)
Glucose-Capillary: 125 mg/dL — ABNORMAL HIGH (ref 70–99)
Glucose-Capillary: 127 mg/dL — ABNORMAL HIGH (ref 70–99)
Glucose-Capillary: 138 mg/dL — ABNORMAL HIGH (ref 70–99)
Glucose-Capillary: 173 mg/dL — ABNORMAL HIGH (ref 70–99)
Glucose-Capillary: 200 mg/dL — ABNORMAL HIGH (ref 70–99)

## 2011-06-11 LAB — CBC
HCT: 33.6 % — ABNORMAL LOW (ref 39.0–52.0)
Hemoglobin: 11.8 g/dL — ABNORMAL LOW (ref 13.0–17.0)
MCH: 26.7 pg (ref 26.0–34.0)
MCHC: 35.1 g/dL (ref 30.0–36.0)
MCV: 76 fL — ABNORMAL LOW (ref 78.0–100.0)
Platelets: 212 10*3/uL (ref 150–400)
RBC: 4.42 MIL/uL (ref 4.22–5.81)
RDW: 15 % (ref 11.5–15.5)
WBC: 14.9 10*3/uL — ABNORMAL HIGH (ref 4.0–10.5)

## 2011-06-12 ENCOUNTER — Inpatient Hospital Stay (HOSPITAL_COMMUNITY): Payer: 59

## 2011-06-12 LAB — CBC
HCT: 31.1 % — ABNORMAL LOW (ref 39.0–52.0)
Hemoglobin: 11 g/dL — ABNORMAL LOW (ref 13.0–17.0)
MCH: 26.8 pg (ref 26.0–34.0)
MCHC: 35.4 g/dL (ref 30.0–36.0)
MCV: 75.9 fL — ABNORMAL LOW (ref 78.0–100.0)
Platelets: 223 10*3/uL (ref 150–400)
RBC: 4.1 MIL/uL — ABNORMAL LOW (ref 4.22–5.81)
RDW: 15.2 % (ref 11.5–15.5)
WBC: 12.9 10*3/uL — ABNORMAL HIGH (ref 4.0–10.5)

## 2011-06-12 LAB — GLUCOSE, CAPILLARY
Glucose-Capillary: 179 mg/dL — ABNORMAL HIGH (ref 70–99)
Glucose-Capillary: 195 mg/dL — ABNORMAL HIGH (ref 70–99)
Glucose-Capillary: 139 mg/dL — ABNORMAL HIGH (ref 70–99)
Glucose-Capillary: 123 mg/dL — ABNORMAL HIGH (ref 70–99)
Glucose-Capillary: 148 mg/dL — ABNORMAL HIGH (ref 70–99)

## 2011-06-12 LAB — BASIC METABOLIC PANEL
BUN: 39 mg/dL — ABNORMAL HIGH (ref 6–23)
CO2: 31 mEq/L (ref 19–32)
Calcium: 8.4 mg/dL (ref 8.4–10.5)
Chloride: 98 mEq/L (ref 96–112)
Creatinine, Ser: 1.75 mg/dL — ABNORMAL HIGH (ref 0.50–1.35)
GFR calc Af Amer: 50 mL/min — ABNORMAL LOW (ref >60)
GFR calc non Af Amer: 41 mL/min — ABNORMAL LOW (ref >60)
Glucose, Bld: 129 mg/dL — ABNORMAL HIGH (ref 70–99)
Potassium: 3.4 mEq/L — ABNORMAL LOW (ref 3.5–5.1)
Sodium: 138 mEq/L (ref 135–145)

## 2011-06-13 ENCOUNTER — Inpatient Hospital Stay (HOSPITAL_COMMUNITY): Payer: 59

## 2011-06-13 LAB — GLUCOSE, CAPILLARY
Glucose-Capillary: 148 mg/dL — ABNORMAL HIGH (ref 70–99)
Glucose-Capillary: 133 mg/dL — ABNORMAL HIGH (ref 70–99)
Glucose-Capillary: 157 mg/dL — ABNORMAL HIGH (ref 70–99)
Glucose-Capillary: 170 mg/dL — ABNORMAL HIGH (ref 70–99)

## 2011-06-13 LAB — BASIC METABOLIC PANEL
BUN: 27 mg/dL — ABNORMAL HIGH (ref 6–23)
CO2: 29 mEq/L (ref 19–32)
Calcium: 8.8 mg/dL (ref 8.4–10.5)
Chloride: 101 mEq/L (ref 96–112)
Creatinine, Ser: 1.37 mg/dL — ABNORMAL HIGH (ref 0.50–1.35)
GFR calc Af Amer: >60 mL/min (ref >60)
GFR calc non Af Amer: 55 mL/min — ABNORMAL LOW (ref >60)
Glucose, Bld: 146 mg/dL — ABNORMAL HIGH (ref 70–99)
Potassium: 4 mEq/L (ref 3.5–5.1)
Sodium: 136 mEq/L (ref 135–145)

## 2011-06-13 LAB — CBC
HCT: 29.5 % — ABNORMAL LOW (ref 39.0–52.0)
Hemoglobin: 10.4 g/dL — ABNORMAL LOW (ref 13.0–17.0)
MCH: 26.6 pg (ref 26.0–34.0)
MCHC: 35.3 g/dL (ref 30.0–36.0)
MCV: 75.4 fL — ABNORMAL LOW (ref 78.0–100.0)
Platelets: 266 10*3/uL (ref 150–400)
RBC: 3.91 MIL/uL — ABNORMAL LOW (ref 4.22–5.81)
RDW: 15.2 % (ref 11.5–15.5)
WBC: 10.7 10*3/uL — ABNORMAL HIGH (ref 4.0–10.5)

## 2011-06-14 LAB — GLUCOSE, CAPILLARY
Glucose-Capillary: 131 mg/dL — ABNORMAL HIGH (ref 70–99)
Glucose-Capillary: 143 mg/dL — ABNORMAL HIGH (ref 70–99)
Glucose-Capillary: 149 mg/dL — ABNORMAL HIGH (ref 70–99)
Glucose-Capillary: 139 mg/dL — ABNORMAL HIGH (ref 70–99)

## 2011-06-15 LAB — GLUCOSE, CAPILLARY: Glucose-Capillary: 138 mg/dL — ABNORMAL HIGH (ref 70–99)

## 2011-06-20 NOTE — Discharge Summary (Signed)
Richard Barton, Richard Barton NO.:  [id number]  MEDICAL RECORD NO.:  [id number]  LOCATION:  2017                         FACILITY:  [facility]  PHYSICIAN:  Salvatore Decent. Cornelius Moras, M.D. DATE OF BIRTH:  Mar 22, 1956  DATE OF ADMISSION:  06/09/2011 DATE OF DISCHARGE:  06/15/2011                              DISCHARGE SUMMARY   HISTORY:  The patient is a 55 year old black male who is a patient of Dr. Karleen Hampshire Tilley's, who has been treating for severe hypertension and hypertensive heart disease.  He recently underwent an echocardiogram in followup on May 03, 2011.  This confirmed the presence of stable left ventricular function with ejection fraction of estimated 50%.  There is severe concentric left ventricular hypertrophy that was also stable in comparison to previous echocardiograms.  However, a small mole or mass was seen in the left atrium attached to the atrial septum which had not been seen on previous echocardiograms and was suggestive of an atrial myxoma.  The patient subsequently underwent cardiac MRI on May 09, 2011, which confirmed the mass and he was measured at 1.8 x 1.6 cm in diameter and appeared smooth and well-circumscribed.  This also suggested atrial myxoma.  He was referred to Dr. Tressie Stalker for surgical removal and he was admitted this hospitalization for the procedure.  REVIEW OF SYMPTOMS:  Please see the history and physical.  PAST MEDICAL HISTORY: 1. Hypertension. 2. Diabetes mellitus type 2. 3. Chronic renal insufficiency. 4. Hyperlipidemia.  PAST SURGICAL HISTORY:  None.  FAMILY HISTORY:  Noncontributory.  SOCIAL HISTORY:  He works as a Visual merchandiser.  He had a long history of tobacco abuse, but he did quit 4 years ago.  Prior to that, he did smoke between two and a half to three packs of cigarettes a day.  He does not use alcohol regularly.  MEDICATIONS PRIOR TO ADMISSION: 1. Lantus insulin 70 units daily. 2. Glimepiride 4 mg daily. 3. Januvia 100  mg daily. 4. Bystolic 20 mg daily. 5. TriCor 145 mg daily. 6. Amlodipine 10 mg daily. 7. Nexium 40 mg daily. 8. Furosemide 40 mg daily. 9. Benazepril 40 mg daily. 10.Minoxidil 2.5 mg twice daily. 11.Mucinex p.r.n. 12.Benadryl p.r.n. 13.Excedrin p.r.n.  DRUG ALLERGIES:  None.  PHYSICAL EXAMINATION:  Please see the history and physical.  TESTING:  Please see the dictated history and physical.  HOSPITAL COURSE:  The patient was admitted electively and on June 09, 2011, he underwent a right miniature thoracotomy for resection of a left atrial myxoma.  He tolerated procedure well, was taken to the Surgical Intensive Care Unit in stable condition.  POSTOPERATIVE HOSPITAL COURSE:  The patient has progressed nicely.  He has remained stable hemodynamically.  He has required a gradual titration of his blood pressure medications.  He has had excellent control of his diabetes using standard protocols and advancement to some level of his preoperative needs, however, he is still requiring significantly less insulin.  The medications will be dictated in a separate portion of this dictation.  His incisions are healing well without evidence of infection.  He has had some bradycardic arrhythmias during postoperative period, but these have stabilized overtime.  He is not  felt to require further intervention for this.  He does have an acute blood loss anemia.  His values have also stabilized.  His most recent hemoglobin and hematocrit dated June 13, 2011, are 10.4 and 29.5 respectively.  His most recent BUN and creatinine are 27 and 1.37 also on June 16, 2011.  His pathology has revealed this to be an  atrial myxoma.  Overall, he is felt to be stable for discharge on today's date.  MEDICATIONS AT DISCHARGE:  Include the following: 1. Aspirin 81 mg p.o. daily. 2. Lantus insulin 20 units subcu every 12 hours. 3. Oxycodone 5-10 mg one every 4-6 hours as needed for pain. 4. Amlodipine 10 mg  daily. 5. Benazepril 40 mg daily. 6. Excedrin p.r.n. 7. Januvia 100 mg daily. 8. Minoxidil 2.5 mg twice daily. 9. Nexium 40 mg daily. 10.TriCor 145 mg daily. For the time being he has not been restarted on his Bystolic, his Amaryl, his Lasix.  INSTRUCTIONS:  The patient received written instructions regarding medications, activity, diet, wound care and follow up.  Follow up include Dr. Orvan July office on June 26, 2011, at 1:00 p.m., additionally, he is instructed to follow up with Dr. Donnie Aho in 2 weeks.  FINAL DIAGNOSIS:  Left atrial myxoma, status post resection.  OTHER DIAGNOSES:  Include 1. Postoperative acute blood loss anemia. 2. Postoperative bradycardic arrhythmias, resolved. 3. Postoperative acute blood loss anemia. 4. History of hypertension. 5. History of diabetes mellitus type 2. 6. History of chronic renal insufficiency. 7. History of hyperlipidemia.     Rowe Clack, P.A.-C.   ______________________________ Salvatore Decent Cornelius Moras, M.D.    Sherryll Burger  D:  06/15/2011  T:  06/15/2011  Job:  [id number]  cc:   Georga Hacking, M.D. Gaspar Garbe, M.D.  Electronically Signed by Gershon Crane P.A.-C. on 06/19/2011 02:50:22 PM Electronically Signed by Tressie Stalker M.D. on 06/20/2011 10:50:31 AM

## 2011-06-23 ENCOUNTER — Other Ambulatory Visit: Payer: Self-pay | Admitting: Thoracic Surgery (Cardiothoracic Vascular Surgery)

## 2011-06-23 DIAGNOSIS — D487 Neoplasm of uncertain behavior of other specified sites: Secondary | ICD-10-CM

## 2011-06-26 ENCOUNTER — Ambulatory Visit
Admission: RE | Admit: 2011-06-26 | Discharge: 2011-06-26 | Disposition: A | Discharge status: Home / Self Care | Payer: 59 | Source: Ambulatory Visit | Attending: Thoracic Surgery (Cardiothoracic Vascular Surgery) | Admitting: Thoracic Surgery (Cardiothoracic Vascular Surgery)

## 2011-06-26 ENCOUNTER — Encounter (INDEPENDENT_AMBULATORY_CARE_PROVIDER_SITE_OTHER): Payer: Self-pay

## 2011-06-26 DIAGNOSIS — D492 Neoplasm of unspecified behavior of bone, soft tissue, and skin: Secondary | ICD-10-CM

## 2011-06-26 DIAGNOSIS — D487 Neoplasm of uncertain behavior of other specified sites: Secondary | ICD-10-CM

## 2011-07-20 ENCOUNTER — Other Ambulatory Visit: Payer: Self-pay | Admitting: Cardiology

## 2011-07-20 ENCOUNTER — Ambulatory Visit
Admission: RE | Admit: 2011-07-20 | Discharge: 2011-07-20 | Disposition: A | Discharge status: Home / Self Care | Payer: 59 | Source: Ambulatory Visit | Attending: Cardiology | Admitting: Cardiology

## 2011-07-20 DIAGNOSIS — R0602 Shortness of breath: Secondary | ICD-10-CM

## 2011-07-20 NOTE — Assessment & Plan Note (Signed)
OFFICE VISIT  CHIN, WACHTER DOB:  Sep 07, 1956                                        June 26, 2011 CHART #:  [id number]  [name] comes in today for 1-week postoperative followup.  He is status post a right mini thoracotomy for resection of a left atrial myxoma on June 09, 2011.  His postoperative course was generally uneventful.  He was discharged home on June 15, 2011, in good condition.  The patient has continued to progress well at home.  He is still having a lot of muscle soreness, particularly with lifting his arm and laying down at night, so he is still taking the Lortab 7.5 regularly.  He is also complaining of some shortness of breath with activity, but this seems to be getting better.  His appetite is slowly improving.  He is walking daily and denies any significant limitations to his activity.  He denies any lower extremity edema.  He has had some episodes of chills and sweats, but his temperature has not gotten above 99.  He has an appointment to see Dr. Donnie Barton on June 29, 2011, cardiology followup. Overall, he feels that he is progressing well.  PHYSICAL EXAMINATION:  VITAL SIGNS:  Blood pressure is 133/80, pulse is 60, respirations 18, and O2 sat 96% on room air. SKIN:  His right thoracotomy incision is healing well.  There is superficial separation of the axillary portion of the incision, which is nonerythematous and there is no drainage.  There is a small amount of eschar covering this area, but the remainder of the area looks clean and dry and intact.  His chest tube sites have healed well and chest tube sutures are removed without difficulty.  The right groin incision is healing well, although the area surrounding it appears a little moist and macerated. HEART:  Regular rate and rhythm without murmurs, rubs, or gallops. LUNGS:  Clear to auscultation on the left with slightly diminished breath sounds in the right base. EXTREMITIES:   Lower extremities show no significant edema.  Chest x-ray shows a slight increase in right pleural effusion with some mild atelectasis bilaterally.  ASSESSMENT/PLAN:  Richard Barton is overall healing well from right thoracotomy for resection of atrial myxoma.  He does have slightly increased right pleural effusion and is short of breath with ambulation. He did not go home on a diuretic, so I have given him Lasix 40 mg daily and potassium 20 mEq daily x1 week.  Also, he has requested a refill on Lortab 7.5/500 and I have refilled #[phone] with no further refills.  He may begin driving as long as he is not taking the pain medication during the day and may resume his normal activities.  The area of superficial separation of his left thoracotomy wound does not appear to be infected and overall appears to be healing well.  I have asked him to keep all his incisions clean and dry and I have instructed him to keep a small gauze at the groin wound to prevent it from stains and moist.  He will see Dr. Donnie Barton this week and he also has an appointment with Dr. Wylene Barton in the next 1-2 weeks to follow up his diabetes, although he states his blood sugars are very stable.  We will plan to have him follow up in 3-4 weeks with Dr.  Cornelius Barton with repeat chest x-ray and he may call in the interim if he experiences any further problems.  Richard Barton, P.A.  GC/MEDQ  D:  06/26/2011  T:  06/27/2011  Job:  [id number]  cc:   Richard Barton, M.D. Richard Barton, M.D. [facility] Office

## 2011-07-27 ENCOUNTER — Other Ambulatory Visit: Payer: Self-pay | Admitting: Thoracic Surgery (Cardiothoracic Vascular Surgery)

## 2011-07-27 DIAGNOSIS — D487 Neoplasm of uncertain behavior of other specified sites: Secondary | ICD-10-CM

## 2011-07-31 ENCOUNTER — Ambulatory Visit (HOSPITAL_COMMUNITY)
Admission: RE | Admit: 2011-07-31 | Discharge: 2011-07-31 | Disposition: A | Discharge status: Home / Self Care | Payer: 59 | Source: Ambulatory Visit | Attending: Cardiology | Admitting: Cardiology

## 2011-07-31 ENCOUNTER — Encounter: Payer: Self-pay | Admitting: Thoracic Surgery (Cardiothoracic Vascular Surgery)

## 2011-07-31 DIAGNOSIS — Z01810 Encounter for preprocedural cardiovascular examination: Secondary | ICD-10-CM | POA: Insufficient documentation

## 2011-07-31 DIAGNOSIS — I059 Rheumatic mitral valve disease, unspecified: Secondary | ICD-10-CM | POA: Insufficient documentation

## 2011-07-31 DIAGNOSIS — Z7901 Long term (current) use of anticoagulants: Secondary | ICD-10-CM | POA: Insufficient documentation

## 2011-07-31 DIAGNOSIS — I1310 Hypertensive heart and chronic kidney disease without heart failure, with stage 1 through stage 4 chronic kidney disease, or unspecified chronic kidney disease: Secondary | ICD-10-CM | POA: Insufficient documentation

## 2011-07-31 DIAGNOSIS — I4892 Unspecified atrial flutter: Secondary | ICD-10-CM | POA: Insufficient documentation

## 2011-07-31 DIAGNOSIS — Z01812 Encounter for preprocedural laboratory examination: Secondary | ICD-10-CM | POA: Insufficient documentation

## 2011-07-31 DIAGNOSIS — E119 Type 2 diabetes mellitus without complications: Secondary | ICD-10-CM | POA: Insufficient documentation

## 2011-07-31 DIAGNOSIS — N183 Chronic kidney disease, stage 3 unspecified: Secondary | ICD-10-CM | POA: Insufficient documentation

## 2011-07-31 HISTORY — PX: OTHER SURGICAL HISTORY: SHX169

## 2011-07-31 LAB — APTT: aPTT: 39 seconds — ABNORMAL HIGH (ref 24–37)

## 2011-07-31 LAB — CBC
HCT: 37.6 % — ABNORMAL LOW (ref 39.0–52.0)
Hemoglobin: 12.9 g/dL — ABNORMAL LOW (ref 13.0–17.0)
MCH: 25 pg — ABNORMAL LOW (ref 26.0–34.0)
MCHC: 34.3 g/dL (ref 30.0–36.0)
MCV: 73 fL — ABNORMAL LOW (ref 78.0–100.0)
Platelets: 542 10*3/uL — ABNORMAL HIGH (ref 150–400)
RBC: 5.15 MIL/uL (ref 4.22–5.81)
RDW: 16.6 % — ABNORMAL HIGH (ref 11.5–15.5)
WBC: 6 10*3/uL (ref 4.0–10.5)

## 2011-07-31 LAB — PROTIME-INR
INR: 2.16 — ABNORMAL HIGH (ref 0.00–1.49)
Prothrombin Time: 24.5 seconds — ABNORMAL HIGH (ref 11.6–15.2)

## 2011-07-31 LAB — GLUCOSE, CAPILLARY: Glucose-Capillary: 116 mg/dL — ABNORMAL HIGH (ref 70–99)

## 2011-08-02 ENCOUNTER — Other Ambulatory Visit: Payer: Self-pay | Admitting: Thoracic Surgery (Cardiothoracic Vascular Surgery)

## 2011-08-03 ENCOUNTER — Other Ambulatory Visit: Payer: Self-pay | Admitting: Thoracic Surgery (Cardiothoracic Vascular Surgery)

## 2011-08-07 ENCOUNTER — Encounter: Payer: Self-pay | Admitting: Thoracic Surgery (Cardiothoracic Vascular Surgery)

## 2011-08-07 ENCOUNTER — Ambulatory Visit
Admission: RE | Admit: 2011-08-07 | Discharge: 2011-08-07 | Disposition: A | Discharge status: Home / Self Care | Payer: 59 | Source: Ambulatory Visit | Attending: Thoracic Surgery (Cardiothoracic Vascular Surgery) | Admitting: Thoracic Surgery (Cardiothoracic Vascular Surgery)

## 2011-08-07 ENCOUNTER — Ambulatory Visit (INDEPENDENT_AMBULATORY_CARE_PROVIDER_SITE_OTHER): Payer: Self-pay | Admitting: Thoracic Surgery (Cardiothoracic Vascular Surgery)

## 2011-08-07 VITALS — BP 142/79 | HR 60 | Resp 18 | Ht 70.5 in | Wt 214.0 lb

## 2011-08-07 DIAGNOSIS — I10 Essential (primary) hypertension: Secondary | ICD-10-CM

## 2011-08-07 DIAGNOSIS — D151 Benign neoplasm of heart: Secondary | ICD-10-CM

## 2011-08-07 DIAGNOSIS — D487 Neoplasm of uncertain behavior of other specified sites: Secondary | ICD-10-CM

## 2011-08-07 DIAGNOSIS — N289 Disorder of kidney and ureter, unspecified: Secondary | ICD-10-CM | POA: Insufficient documentation

## 2011-08-07 DIAGNOSIS — J90 Pleural effusion, not elsewhere classified: Secondary | ICD-10-CM

## 2011-08-07 DIAGNOSIS — I1310 Hypertensive heart and chronic kidney disease without heart failure, with stage 1 through stage 4 chronic kidney disease, or unspecified chronic kidney disease: Secondary | ICD-10-CM | POA: Insufficient documentation

## 2011-08-07 DIAGNOSIS — E119 Type 2 diabetes mellitus without complications: Secondary | ICD-10-CM

## 2011-08-07 HISTORY — DX: Benign neoplasm of heart: D15.1

## 2011-08-07 HISTORY — DX: Pleural effusion, not elsewhere classified: J90

## 2011-08-07 NOTE — Patient Instructions (Signed)
The patient has been instructed that they may return driving an automobile if it is okay with Dr. Donnie Aho as long as he is no longer requiring oral narcotic pain relievers during the daytime.  He has been advised to start driving short distances during the daylight and gradually increase from there as they feel comfortable. The patient has been advised to continue to gradually increase their physical activity as tolerated.  They should refrain from any heavy lifting or strenuous use of their arms and shoulders until at least 8 weeks from the time of their surgery, and they should avoid activities that cause increased pain in their chest on the side of their surgical incision.

## 2011-08-07 NOTE — Progress Notes (Signed)
HPI: Patient returns for routine postoperative follow-up having undergone right miniature thoracotomy for resection of left atrial myxoma on 06/09/2011.  The patient's early postoperative recovery while in the hospital was uncomplicated, and he was last seen here in our office on 06/26/2011.  He later developed persistent atrial flutter for which she underwent cardioversion last week. Since then the patient reports feeling much better. He has no shortness of breath and no significant soreness in his chest. He is eating well and overall feeling pretty good. He also reports that pain that he had previously had in his feet with ambulation has completely resolved since he had his myxoma removed. This seems hard to explain. Since cardioversion he has not had any recurrent tachypalpitations and overall he feels quite well.  Current Outpatient Prescriptions  Medication Sig Dispense Refill  . amLODipine (NORVASC) 10 MG tablet Take 10 mg by mouth daily.        Marland Kitchen aspirin 81 MG tablet Take 81 mg by mouth daily.        Marland Kitchen aspirin-acetaminophen-caffeine (EXCEDRIN MIGRAINE) 250-250-65 MG per tablet Take 1 tablet by mouth every 6 (six) hours as needed.        . benazepril (LOTENSIN) 40 MG tablet Take 40 mg by mouth daily.        Marland Kitchen desoximetasone (TOPICORT) 0.05 % cream Apply topically 2 (two) times daily.        . diphenhydrAMINE (BENADRYL) 25 MG tablet Take 25 mg by mouth at bedtime as needed.        Marland Kitchen esomeprazole (NEXIUM) 40 MG capsule Take 40 mg by mouth daily before breakfast.        . fenofibrate (TRICOR) 145 MG tablet Take 145 mg by mouth daily.        . furosemide (LASIX) 40 MG tablet Take 40 mg by mouth daily.        Marland Kitchen glimepiride (AMARYL) 4 MG tablet Take 4 mg by mouth daily before breakfast.        . guaiFENesin (MUCINEX) 600 MG 12 hr tablet Take 1,200 mg by mouth as needed.        . insulin glargine (LANTUS) 100 UNIT/ML injection Inject 70 Units into the skin at bedtime.        . minoxidil (LONITEN)  2.5 MG tablet Take 2.5 mg by mouth 2 (two) times daily.        . nebivolol (BYSTOLIC) 10 MG tablet Take 20 mg by mouth daily.        . sitaGLIPtin (JANUVIA) 100 MG tablet Take 100 mg by mouth daily.        . tadalafil (CIALIS) 10 MG tablet Take 10 mg by mouth daily as needed.        . warfarin (COUMADIN) 5 MG tablet Take 5 mg by mouth daily. Take as directed       . nebivolol (BYSTOLIC) 10 MG tablet Take 10 mg by mouth daily.          Physical Exam: On physical exam the patient looks quite good. His minithoracotomy incision has healed completely. Auscultation of the chest demonstrates clear breath sounds which are symmetrical bilaterally. Cardiovascular exam is notable for regular rate and rhythm. No murmurs rubs or gallops are noted. The extremities are warm and well-perfused. The right groin incision is healed completely.  Diagnostic Tests: Chest x-ray performed today is reviewed. This demonstrates clear lung fields bilaterally. The small right pleural effusion that had been seen on previous x-ray has resolved completely. No  other abnormalities are noted.  Impression: Excellent progress following resection of left atrial myxoma via right mini thoracotomy. The previously present right pleural effusion has resolved completely. The patient underwent cardioversion last week or atrial flutter and is feeling much better since then.  Plan: Encourage the patient to gradually increase his physical activity as tolerated without any particular limitations. He plans to followup with Dr. Donnie Aho later today and I have suggested that he should ask about driving a car. From a surgical standpoint I think it would be reasonable for him to go back to driving an automobile, but I would like him to discuss the specifically with respect to his recent cardioversion. We will plan to see him back for further followup in 3 months.

## 2011-08-23 NOTE — Op Note (Signed)
  NAMEHARPER, SMOKER NO.:  [id number]  MEDICAL RECORD NO.:  [id number]  LOCATION:  [facility]                         FACILITY:  [facility]  PHYSICIAN:  Georga Hacking, M.D.DATE OF BIRTH:  1956-10-24  DATE OF PROCEDURE:  07/31/2011                               OPERATIVE REPORT   HISTORY:  The patient is a 55 year old male who has severe hypertensive heart disease and recently had removal of an atrial myxoma.  He developed atrial flutter postoperatively and has been on warfarin therapy with a therapeutic INR and is brought in for cardioversion.  The patient underwent anesthesia by Dr. Autumn Patty with 40 mg of lidocaine followed by 50 mg of propofol.  Cardioversion was done with synchronized biphasic defibrillation with 75 watts.  He cardioverted initially into a slow junctional rhythm at a rate of around 24 and then began to have atrial activity, and eventually was in sinus bradycardia at a rate of 55 after a couple of minutes.  He tolerated the procedure well and was awake and alert at the end of the procedure.  IMPRESSION:  Successful cardioversion of atrial flutter.     Georga Hacking, M.D.     WST/MEDQ  D:  07/31/2011  T:  07/31/2011  Job:  [id number]  cc:   Gaspar Garbe, M.D. Salvatore Decent. Cornelius Moras, M.D.  Electronically Signed by Lacretia Nicks. Donnie Aho M.D. on 08/23/2011 12:34:42 PM

## 2011-09-26 IMAGING — CR DG CHEST 2V
2 series · 2 of 2 positions shown · non-contrast
Comparison: 06/13/2011

CLINICAL DATA: Cough and shortness of breath.

CHEST - 2 VIEW

[w chest pa]
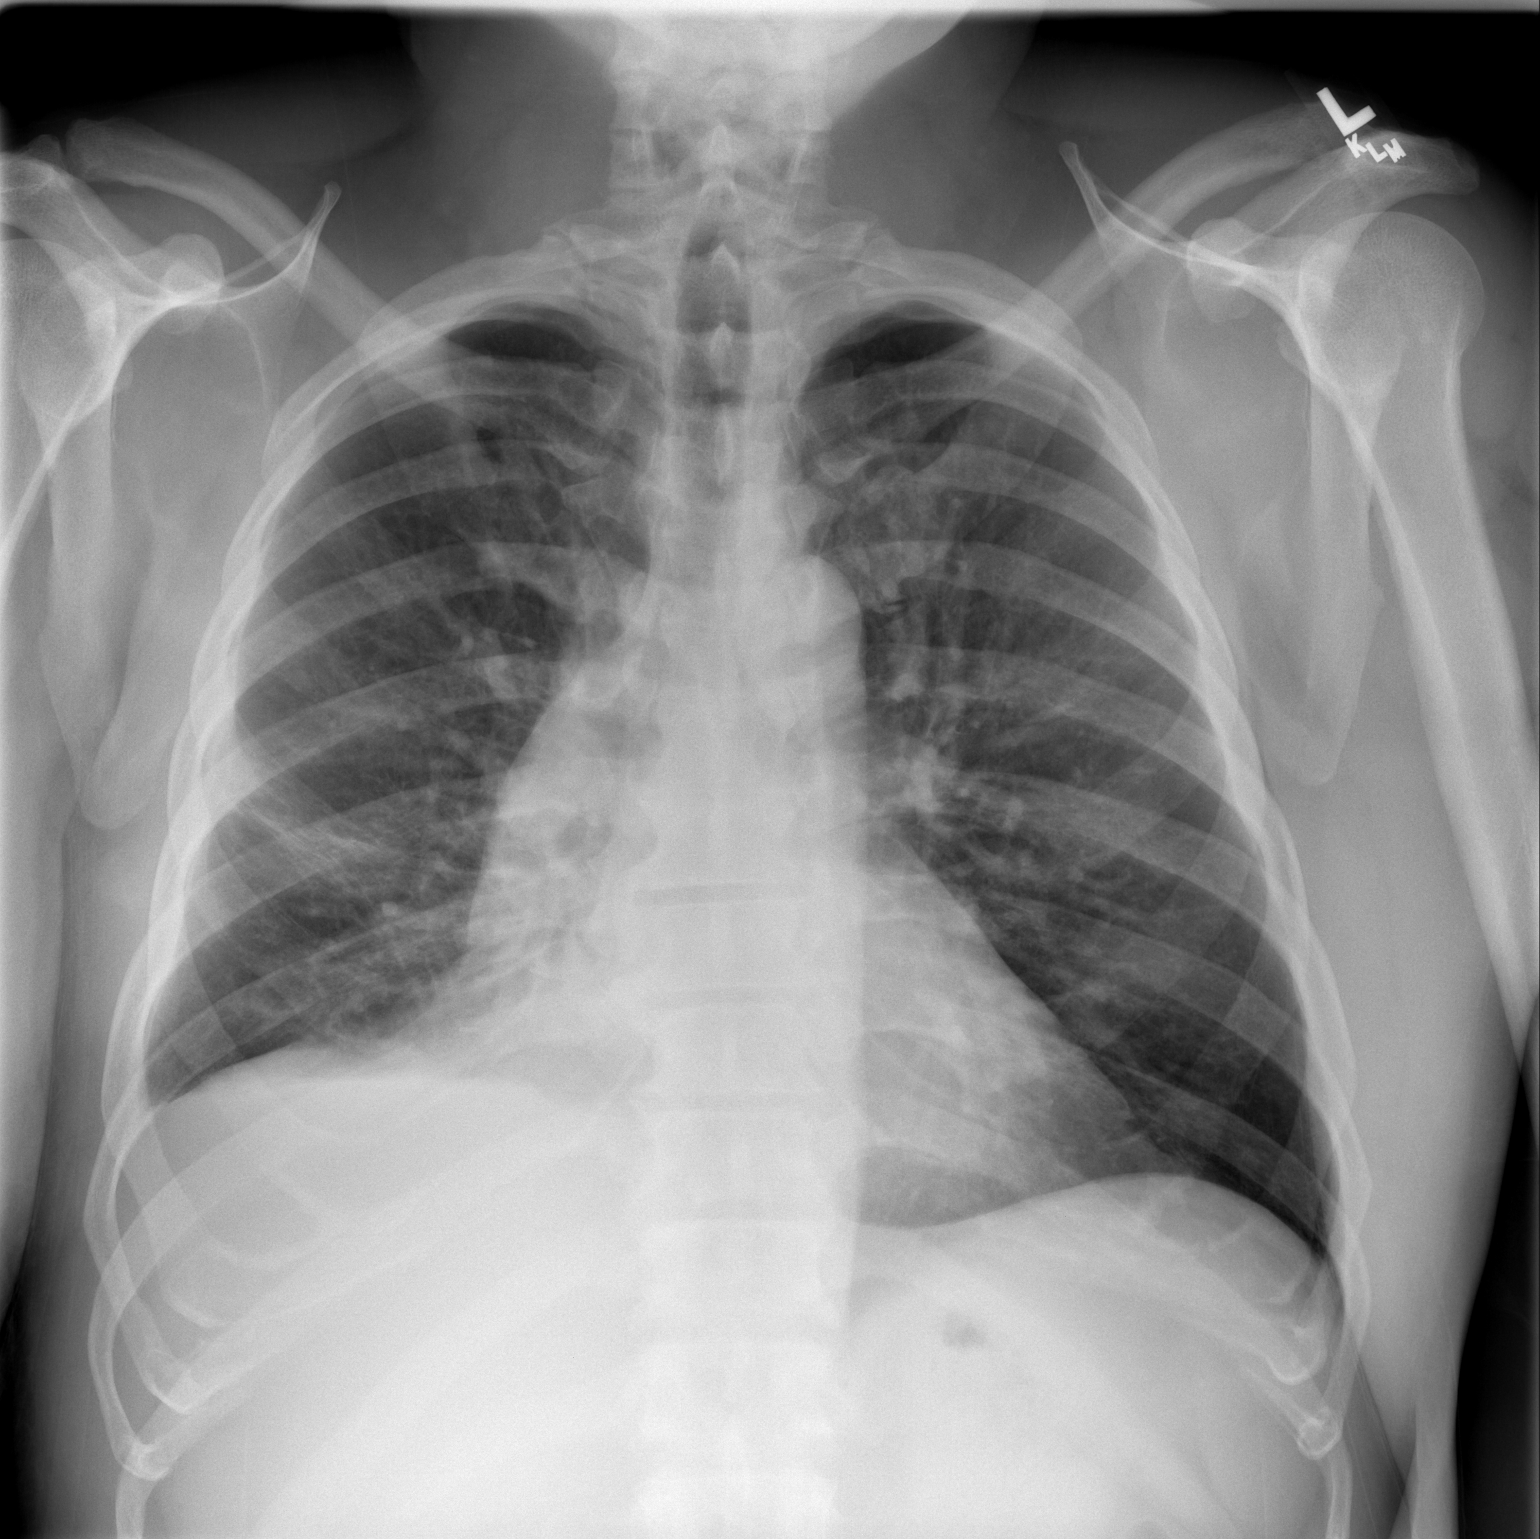

[w chest lat]
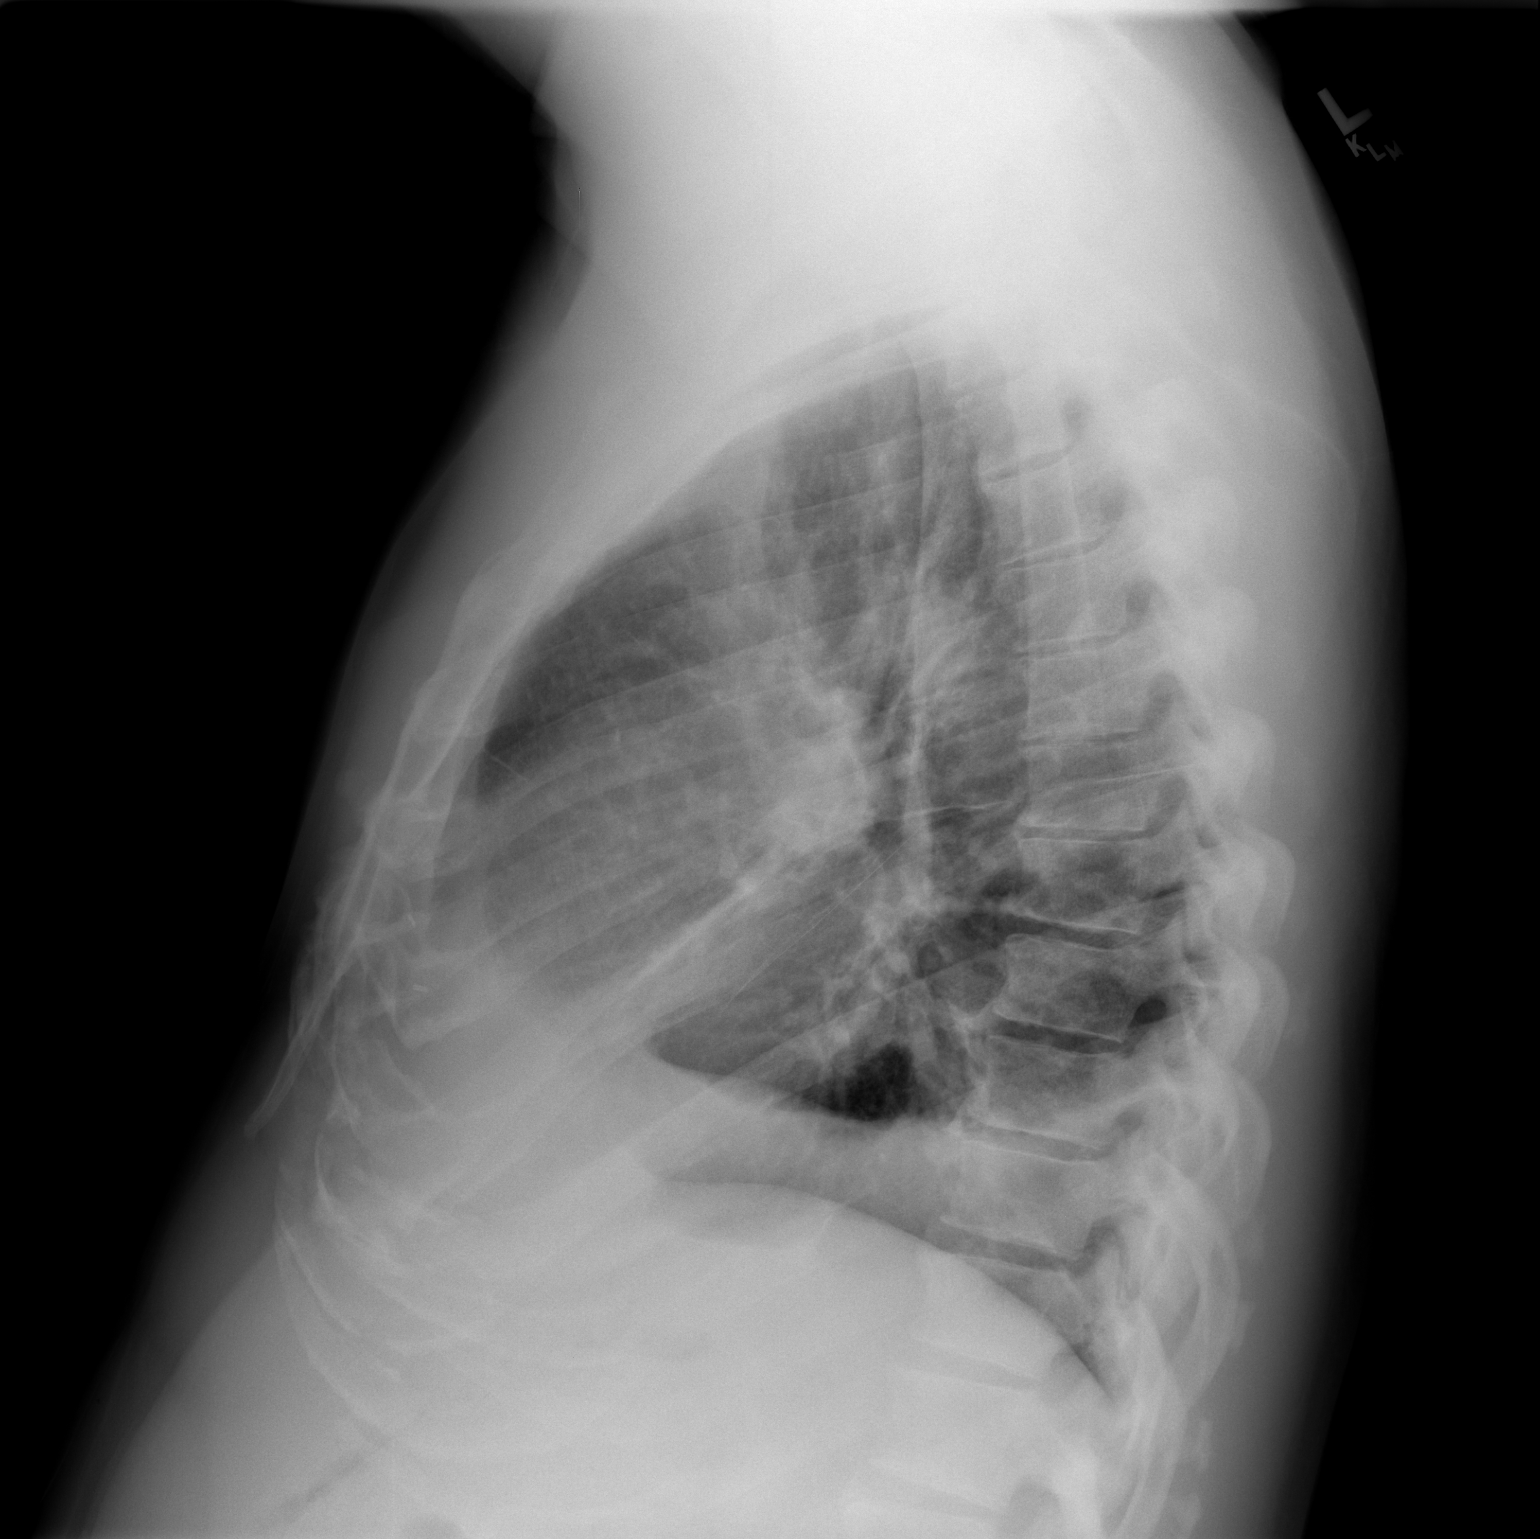

[2 of 2 positions shown; findings below may reference images not displayed]

FINDINGS: Interval decrease and right basilar atelectasis.  There
is a tiny right pleural effusion.  Left lung is clear.
Cardiopericardial silhouette is at upper limits of normal for size.
Imaged bony structures of the thorax are intact.
IMPRESSION: Improving aeration at the right lung base.  Otherwise stable.

## 2011-10-20 IMAGING — CR DG CHEST 2V
2 series · 2 of 2 positions shown · non-contrast
Comparison: Chest x-ray 06/26/2011.

CLINICAL DATA: Shortness of breath.

CHEST - 2 VIEW

[w chest pa]
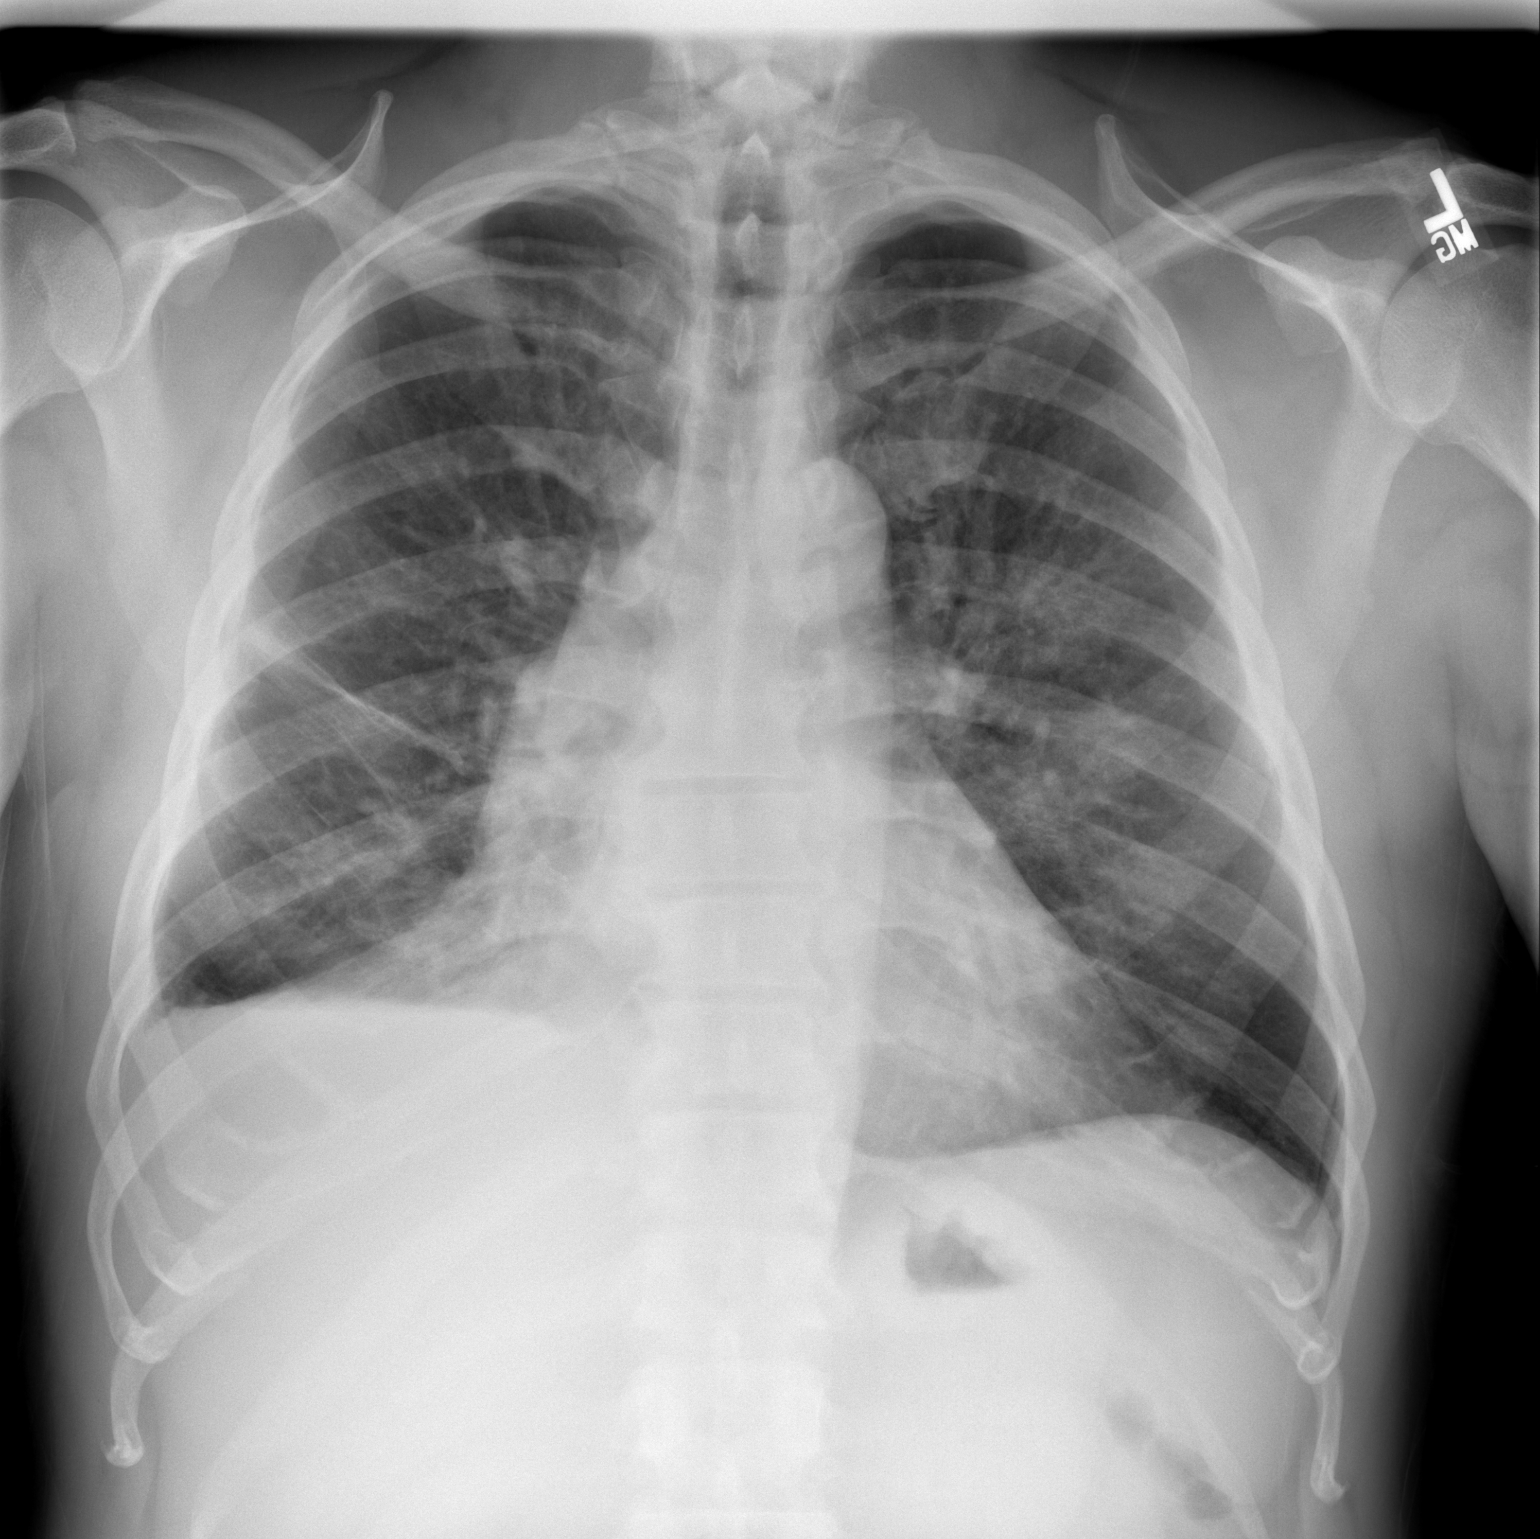

[w chest lat]
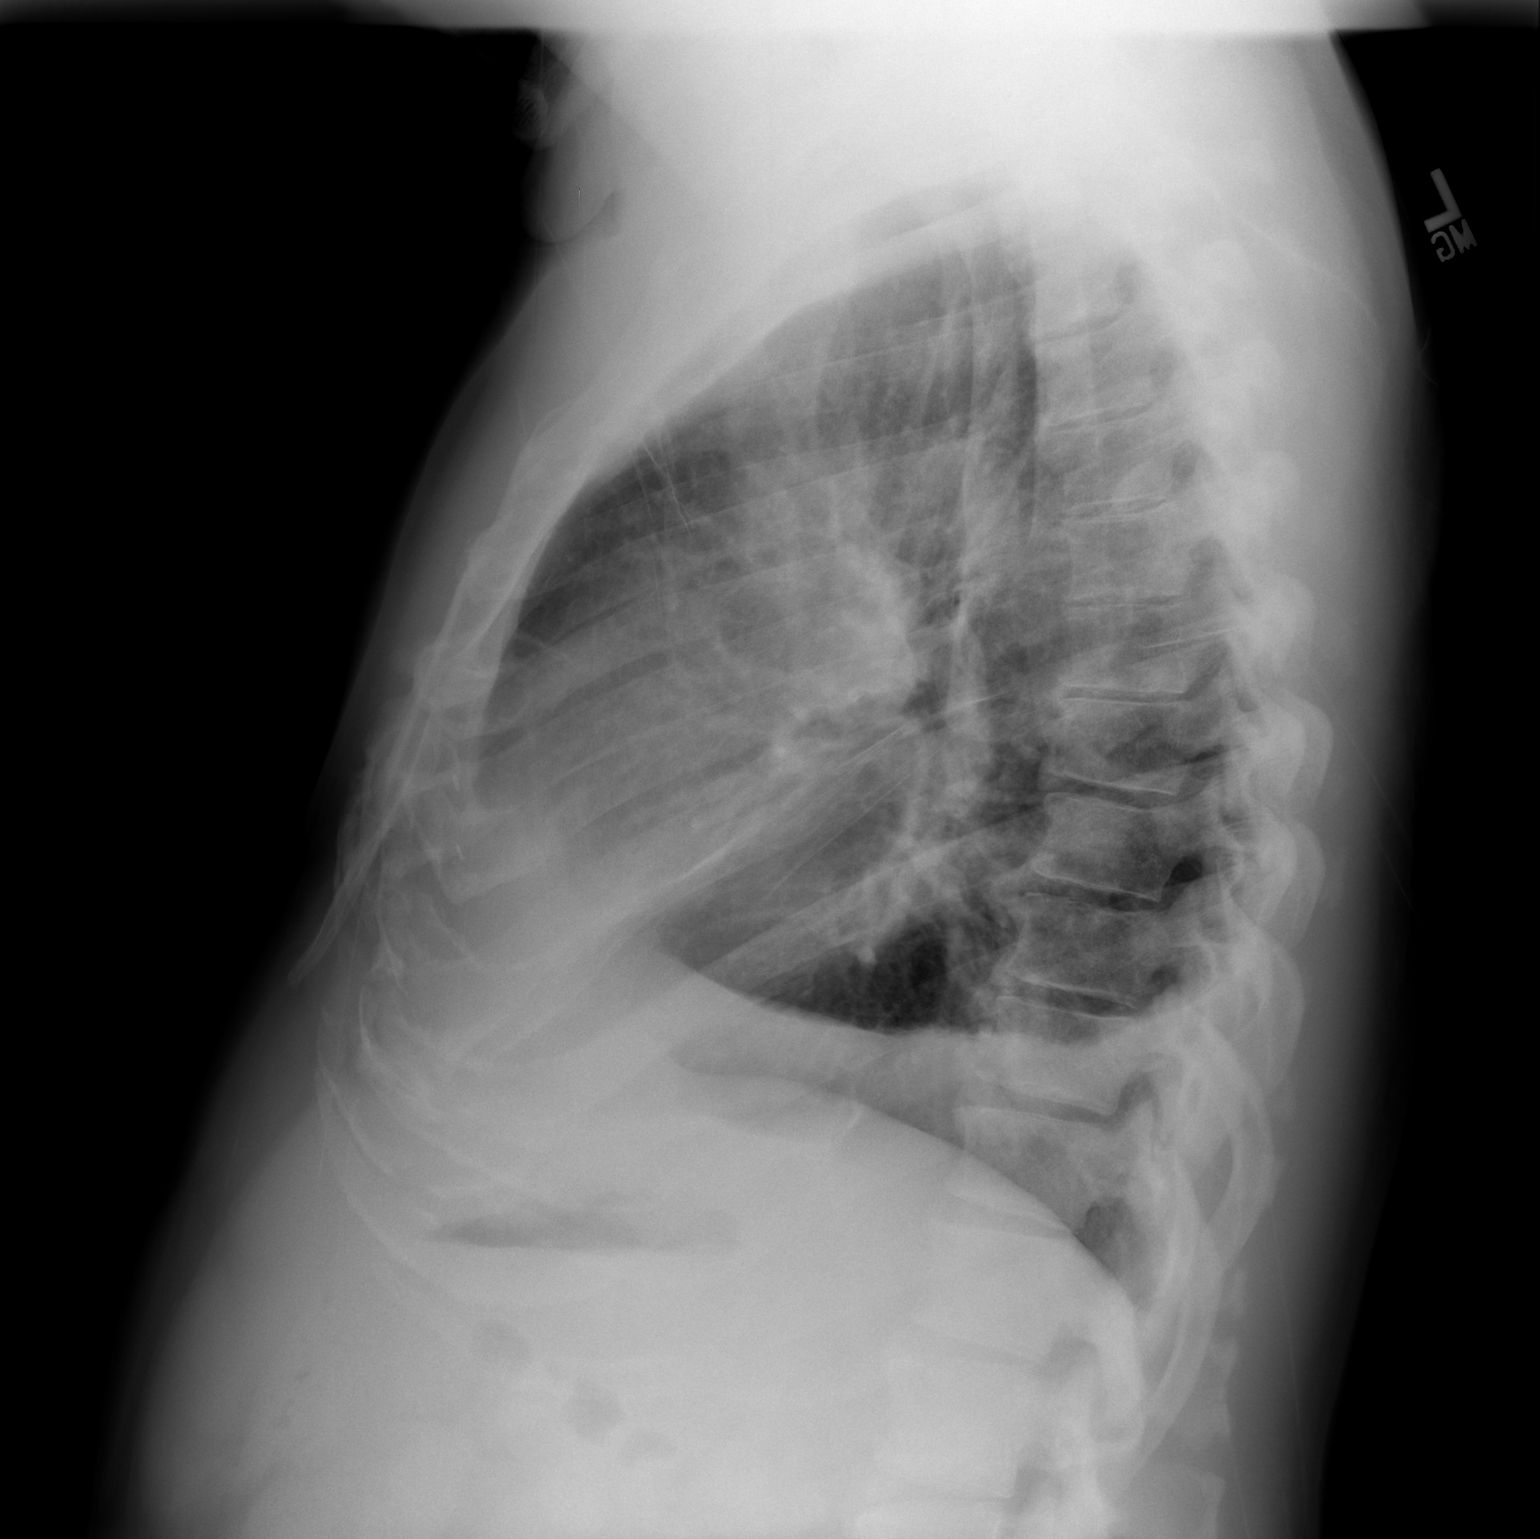

[2 of 2 positions shown; findings below may reference images not displayed]

FINDINGS: The cardiac silhouette, mediastinal and hilar contours
are stable.  There are underlying linear scarring changes with
superimposed vascular congestion and possible mild edema.  A small
right effusion is again demonstrated.
IMPRESSION: 1.  Vascular congestion and probable mild interstitial pulmonary
edema.
2.  Small persistent right effusion.

## 2011-11-03 ENCOUNTER — Encounter: Payer: Self-pay | Admitting: *Deleted

## 2011-11-03 DIAGNOSIS — E119 Type 2 diabetes mellitus without complications: Secondary | ICD-10-CM | POA: Insufficient documentation

## 2011-11-06 ENCOUNTER — Ambulatory Visit (INDEPENDENT_AMBULATORY_CARE_PROVIDER_SITE_OTHER): Payer: 59 | Admitting: Thoracic Surgery (Cardiothoracic Vascular Surgery)

## 2011-11-06 ENCOUNTER — Encounter: Payer: Self-pay | Admitting: Thoracic Surgery (Cardiothoracic Vascular Surgery)

## 2011-11-06 VITALS — BP 144/84 | HR 53 | Resp 20 | Ht 70.5 in | Wt 226.0 lb

## 2011-11-06 DIAGNOSIS — Z9889 Other specified postprocedural states: Secondary | ICD-10-CM

## 2011-11-06 DIAGNOSIS — D151 Benign neoplasm of heart: Secondary | ICD-10-CM

## 2011-11-06 NOTE — Progress Notes (Signed)
[address]            [place] [address]          [phone]     CARDIOTHORACIC SURGERY OFFICE NOTE  Referring Provider is Elliot Gault Eilleen Kempf., * PCP is Gaspar Garbe, MD, MD   HPI:  Patient returns for follow-up 5 months status post resection of left atrial myxoma. He reports doing exceptionally well. He states that his breathing and his exercise tolerance are better than it was prior to surgery. He also notes that prior to surgery his legs would get tired with ambulation and this seems to have gone away as well. He has some mild residual numbness along his right miniature thoracotomy incision as well as his right groin incision. He reports an occasional twinges mild pain with certain specific movements but otherwise he has no pain and no physical limitations whatsoever. He is very happy with his progress and overall he has no complaints.   Current Outpatient Prescriptions  Medication Sig Dispense Refill  . amLODipine (NORVASC) 10 MG tablet Take 10 mg by mouth daily.        Marland Kitchen aspirin 81 MG tablet Take 81 mg by mouth daily.        Marland Kitchen aspirin-acetaminophen-caffeine (EXCEDRIN MIGRAINE) 250-250-65 MG per tablet Take 1 tablet by mouth every 6 (six) hours as needed.        . benazepril (LOTENSIN) 40 MG tablet Take 40 mg by mouth daily.        Marland Kitchen desoximetasone (TOPICORT) 0.05 % cream Apply topically 2 (two) times daily.        . diphenhydrAMINE (BENADRYL) 25 MG tablet Take 25 mg by mouth at bedtime as needed.        Marland Kitchen esomeprazole (NEXIUM) 40 MG capsule Take 40 mg by mouth daily before breakfast.        . fenofibrate (TRICOR) 145 MG tablet Take 145 mg by mouth daily.        . furosemide (LASIX) 40 MG tablet Take 40 mg by mouth daily.        Marland Kitchen glimepiride (AMARYL) 4 MG tablet Take 4 mg by mouth daily before breakfast.        . guaiFENesin (MUCINEX) 600 MG 12 hr tablet Take 1,200 mg by mouth as needed.        . insulin glargine (LANTUS) 100 UNIT/ML  injection Inject 70 Units into the skin at bedtime.        . minoxidil (LONITEN) 2.5 MG tablet Take 2.5 mg by mouth 2 (two) times daily.        . nebivolol (BYSTOLIC) 10 MG tablet Take 20 mg by mouth daily.        . sitaGLIPtin (JANUVIA) 100 MG tablet Take 100 mg by mouth daily.        . tadalafil (CIALIS) 10 MG tablet Take 10 mg by mouth daily as needed.        . warfarin (COUMADIN) 5 MG tablet Take 5 mg by mouth daily. Take as directed           Physical Exam:   BP 144/84  Pulse 53  Resp 20  Ht 5' 10.5" (1.791 m)  Wt 102.513 kg (226 lb)  BMI 31.97 kg/m2  SpO2 96%  HEENT:  Unremarkable  Chest:   Clear to auscultation. The minithoracotomy scar is healed nicely.  CV:  Regular rate and rhythm without murmur  Abdomen:  Soft and nontender  Extremities:  Warm and well-perfused  Diagnostic Tests:  Not applicable   Impression:  Excellent progress now 5 months status post resection of left atrial myxoma via right mini thoracotomy  Plan:  In the future [name] will call and return to see [place] as needed. All of his questions have been addressed.   Salvatore Decent. Cornelius Moras, MD 11/06/2011 11:22 AM

## 2016-11-22 DIAGNOSIS — N183 Chronic kidney disease, stage 3 (moderate): Secondary | ICD-10-CM | POA: Diagnosis not present

## 2016-11-22 DIAGNOSIS — Z1389 Encounter for screening for other disorder: Secondary | ICD-10-CM | POA: Diagnosis not present

## 2016-11-22 DIAGNOSIS — K219 Gastro-esophageal reflux disease without esophagitis: Secondary | ICD-10-CM | POA: Diagnosis not present

## 2016-12-26 DIAGNOSIS — I129 Hypertensive chronic kidney disease with stage 1 through stage 4 chronic kidney disease, or unspecified chronic kidney disease: Secondary | ICD-10-CM | POA: Diagnosis not present

## 2016-12-26 DIAGNOSIS — N183 Chronic kidney disease, stage 3 (moderate): Secondary | ICD-10-CM | POA: Diagnosis not present

## 2016-12-26 DIAGNOSIS — E1122 Type 2 diabetes mellitus with diabetic chronic kidney disease: Secondary | ICD-10-CM | POA: Diagnosis not present

## 2017-02-15 DIAGNOSIS — R509 Fever, unspecified: Secondary | ICD-10-CM | POA: Diagnosis not present

## 2017-02-15 DIAGNOSIS — D72829 Elevated white blood cell count, unspecified: Secondary | ICD-10-CM | POA: Diagnosis not present

## 2017-02-15 DIAGNOSIS — N179 Acute kidney failure, unspecified: Secondary | ICD-10-CM | POA: Diagnosis not present

## 2017-02-15 DIAGNOSIS — N390 Urinary tract infection, site not specified: Secondary | ICD-10-CM | POA: Diagnosis not present

## 2017-02-19 DIAGNOSIS — R8299 Other abnormal findings in urine: Secondary | ICD-10-CM | POA: Diagnosis not present

## 2017-02-19 DIAGNOSIS — N179 Acute kidney failure, unspecified: Secondary | ICD-10-CM | POA: Diagnosis not present

## 2017-02-19 DIAGNOSIS — N410 Acute prostatitis: Secondary | ICD-10-CM | POA: Diagnosis not present

## 2017-02-19 DIAGNOSIS — N390 Urinary tract infection, site not specified: Secondary | ICD-10-CM | POA: Diagnosis not present

## 2017-02-21 DIAGNOSIS — E1129 Type 2 diabetes mellitus with other diabetic kidney complication: Secondary | ICD-10-CM | POA: Diagnosis not present

## 2017-02-21 DIAGNOSIS — N183 Chronic kidney disease, stage 3 (moderate): Secondary | ICD-10-CM | POA: Diagnosis not present

## 2017-02-21 DIAGNOSIS — E7800 Pure hypercholesterolemia, unspecified: Secondary | ICD-10-CM | POA: Diagnosis not present

## 2017-02-21 DIAGNOSIS — Z125 Encounter for screening for malignant neoplasm of prostate: Secondary | ICD-10-CM | POA: Diagnosis not present

## 2017-02-26 DIAGNOSIS — N410 Acute prostatitis: Secondary | ICD-10-CM | POA: Diagnosis not present

## 2017-02-28 DIAGNOSIS — Z1389 Encounter for screening for other disorder: Secondary | ICD-10-CM | POA: Diagnosis not present

## 2017-02-28 DIAGNOSIS — N410 Acute prostatitis: Secondary | ICD-10-CM | POA: Diagnosis not present

## 2017-02-28 DIAGNOSIS — I1310 Hypertensive heart and chronic kidney disease without heart failure, with stage 1 through stage 4 chronic kidney disease, or unspecified chronic kidney disease: Secondary | ICD-10-CM | POA: Diagnosis not present

## 2017-02-28 DIAGNOSIS — Z0000 Encounter for general adult medical examination without abnormal findings: Secondary | ICD-10-CM | POA: Diagnosis not present

## 2017-02-28 DIAGNOSIS — I517 Cardiomegaly: Secondary | ICD-10-CM | POA: Diagnosis not present

## 2017-03-01 DIAGNOSIS — Z0000 Encounter for general adult medical examination without abnormal findings: Secondary | ICD-10-CM | POA: Diagnosis not present

## 2017-03-01 DIAGNOSIS — I1310 Hypertensive heart and chronic kidney disease without heart failure, with stage 1 through stage 4 chronic kidney disease, or unspecified chronic kidney disease: Secondary | ICD-10-CM | POA: Diagnosis not present

## 2017-03-01 DIAGNOSIS — N410 Acute prostatitis: Secondary | ICD-10-CM | POA: Diagnosis not present

## 2017-03-01 DIAGNOSIS — I517 Cardiomegaly: Secondary | ICD-10-CM | POA: Diagnosis not present

## 2017-03-02 DIAGNOSIS — Z1212 Encounter for screening for malignant neoplasm of rectum: Secondary | ICD-10-CM | POA: Diagnosis not present

## 2017-06-13 DIAGNOSIS — R05 Cough: Secondary | ICD-10-CM | POA: Diagnosis not present

## 2017-06-13 DIAGNOSIS — J209 Acute bronchitis, unspecified: Secondary | ICD-10-CM | POA: Diagnosis not present

## 2017-07-12 DIAGNOSIS — E1122 Type 2 diabetes mellitus with diabetic chronic kidney disease: Secondary | ICD-10-CM | POA: Diagnosis not present

## 2017-07-12 DIAGNOSIS — N183 Chronic kidney disease, stage 3 (moderate): Secondary | ICD-10-CM | POA: Diagnosis not present

## 2017-07-12 DIAGNOSIS — I129 Hypertensive chronic kidney disease with stage 1 through stage 4 chronic kidney disease, or unspecified chronic kidney disease: Secondary | ICD-10-CM | POA: Diagnosis not present

## 2017-08-27 DIAGNOSIS — E1129 Type 2 diabetes mellitus with other diabetic kidney complication: Secondary | ICD-10-CM | POA: Diagnosis not present

## 2017-08-27 DIAGNOSIS — N183 Chronic kidney disease, stage 3 (moderate): Secondary | ICD-10-CM | POA: Diagnosis not present

## 2017-08-27 DIAGNOSIS — I1310 Hypertensive heart and chronic kidney disease without heart failure, with stage 1 through stage 4 chronic kidney disease, or unspecified chronic kidney disease: Secondary | ICD-10-CM | POA: Diagnosis not present

## 2017-09-06 DIAGNOSIS — N183 Chronic kidney disease, stage 3 (moderate): Secondary | ICD-10-CM | POA: Diagnosis not present

## 2017-09-06 DIAGNOSIS — I119 Hypertensive heart disease without heart failure: Secondary | ICD-10-CM | POA: Diagnosis not present

## 2017-09-10 ENCOUNTER — Encounter: Payer: Self-pay | Admitting: Cardiology

## 2017-09-10 DIAGNOSIS — I119 Hypertensive heart disease without heart failure: Secondary | ICD-10-CM | POA: Diagnosis not present

## 2018-01-02 DIAGNOSIS — H401133 Primary open-angle glaucoma, bilateral, severe stage: Secondary | ICD-10-CM | POA: Diagnosis not present

## 2018-01-31 DIAGNOSIS — H401133 Primary open-angle glaucoma, bilateral, severe stage: Secondary | ICD-10-CM | POA: Diagnosis not present

## 2018-02-14 ENCOUNTER — Ambulatory Visit (INDEPENDENT_AMBULATORY_CARE_PROVIDER_SITE_OTHER): Payer: Self-pay

## 2018-02-14 ENCOUNTER — Encounter (INDEPENDENT_AMBULATORY_CARE_PROVIDER_SITE_OTHER): Payer: Self-pay | Admitting: Orthopaedic Surgery

## 2018-02-14 ENCOUNTER — Ambulatory Visit (INDEPENDENT_AMBULATORY_CARE_PROVIDER_SITE_OTHER): Payer: Self-pay | Admitting: Orthopaedic Surgery

## 2018-02-14 DIAGNOSIS — M25511 Pain in right shoulder: Secondary | ICD-10-CM

## 2018-02-14 DIAGNOSIS — M5441 Lumbago with sciatica, right side: Secondary | ICD-10-CM | POA: Diagnosis not present

## 2018-02-14 DIAGNOSIS — G8929 Other chronic pain: Secondary | ICD-10-CM | POA: Insufficient documentation

## 2018-02-14 HISTORY — DX: Other chronic pain: G89.29

## 2018-02-14 MED ORDER — METHYLPREDNISOLONE 4 MG PO TBPK: ORAL_TABLET | ORAL | 0 refills | Status: DC

## 2018-02-14 MED ORDER — METHOCARBAMOL 500 MG PO TABS: 500.0000 mg | ORAL_TABLET | Freq: Three times a day (TID) | ORAL | 0 refills | Status: DC | PRN

## 2018-02-14 NOTE — Progress Notes (Signed)
Office Visit Note   Patient: Richard Barton           Date of Birth: May 30, 1956           MRN: [id number] Visit Date: 02/14/2018              Requested by: Richard Pao, MD [address] [place], [place] [address] PCP: Richard Pao, MD   Assessment & Plan: Visit Diagnoses:  1. Chronic right shoulder pain   2. Chronic right-sided low back pain with right-sided sciatica     Plan: Impression is #1 right shoulder rotator cuff tendinitis.  #2 right lower back pain.  At this point, we will place the patient on a low-dose steroid taper as well as a muscle relaxer.  He will keep an extra close eye on his blood sugar over the next few days.  We will also send him to outpatient physical therapy.  He will follow-up with [place] in 6 weeks time for recheck.  We discussed the possibility of repeating the epidural steroid injection but the patient is a little hesitant.  He will call with concerns or questions in the meantime.  Follow-Up Instructions: Return in about 6 weeks (around 03/28/2018) for recheck.   Orders:  Orders Placed This Encounter  Procedures  . XR Lumbar Spine 2-3 Views  . XR Shoulder Right   Meds ordered this encounter  Medications  . methylPREDNISolone (MEDROL DOSEPAK) 4 MG TBPK tablet    Sig: Take as directed    Dispense:  21 tablet    Refill:  0  . methocarbamol (ROBAXIN) 500 MG tablet    Sig: Take 1 tablet (500 mg total) by mouth 3 (three) times daily as needed for muscle spasms.    Dispense:  60 tablet    Refill:  0      Procedures: No procedures performed   Clinical Data: No additional findings.   Subjective: Chief Complaint  Patient presents with  . Lower Back - Follow-up    HPI Richard Barton is a pleasant 62 year old new patient left-hand-dominant male who presents to our clinic today with right shoulder and right-sided lower back pain.  In regards to his lower back, this is been ongoing for the past 6-8 months without any known injury or change in  activity.  He is a former and lifts heavy feet on a daily basis.  The pain he has is to the right side of his lower back radiating down the right buttocks and occasionally into the lateral hip.  He does note intermittent pain going down the entire aspect of his posterior leg and into the foot.  No numbness tingling burning.  No anterior thigh or groin pain.  He notes occasional spasms of his lower back when he is lying down at night.  He also notes increased pain when driving.  He gets some relief with sleeping in a recliner.  He does note previous back pain 2 years ago where he underwent an epidural steroid injection by Richard Barton.  This did not help one bit.  He also notes that he has seen Richard Barton in the past who proposed a lumbar fusion but said he would never be able to form again.  He did not want to proceed with this.  In regards to the right shoulder, pain here is been ongoing for the past 6-8 months.  He is left-handed but carries heavy feed over his right shoulder on a daily basis.  The pain he  has is over the entire right shoulder.  He describes this as a nagging type pain worse with forward flexion and internal rotation.  He is able to sleep on the right side without pain.  He is taken Excedrin migraine which occasionally helps his pain.  Of note he is a diabetic.  Review of Systems as detailed in HPI.  All others reviewed are negative.   Objective: Vital Signs: There were no vitals taken for this visit.  Physical Exam well-developed well-nourished gentleman in no acute distress.  Alert and oriented x3.  Ortho Exam examination of the right shoulder reveals full forward flexion and abduction.  He can internally rotate to T12.  Markedly positive empty can and cross body abduction.  Negative drop arm.  He is neurovascular intact distally.  Examination of his lumbar spine reveals minimal paraspinous tenderness.  No increased pain with flexion or extension of the back.  Positive straight leg raise  both sides.  Full strength.  He is neurovascular intact distally.  Specialty Comments:  No specialty comments available.  Imaging: Xr Lumbar Spine 2-3 Views  Result Date: 02/14/2018 X-rays of the lumbar spine will moderate degenerative disc disease L5-S1 with diffuse anterior spurring throughout the lumbar spine.  Xr Shoulder Right  Result Date: 02/14/2018 X-rays of the right shoulder reveal moderate AC degenerative changes    PMFS History: Patient Active Problem List   Diagnosis Date Noted  . Chronic right shoulder pain 02/14/2018  . Chronic right-sided low back pain with right-sided sciatica 02/14/2018  . Benign neoplasm of heart 08/07/2011  . Unspecified pleural effusion 08/07/2011  . Left Atrial myxoma   . HTN (hypertension)   . DM2 (diabetes mellitus, type 2) ([place])   . Renal insufficiency    Past Medical History:  Diagnosis Date  . Atrial flutter ([place])   . Atrial myxoma    Left  . Diabetes mellitus type II   . DM2 (diabetes mellitus, type 2) ([place])   . HTN (hypertension)   . Hyperlipidemia   . Renal insufficiency     History reviewed. No pertinent family history.  Past Surgical History:  Procedure Laterality Date  . CARDIAC CATHETERIZATION    . cardioversion of atrial flutter.  07/31/11  . Right miniature thoracotomy for resection of left atrial myxoma.  06/09/11   Richard Barton   Social History   Occupational History  . Occupation: IT sales professional: SELF-EMPLOYED  Tobacco Use  . Smoking status: Former Smoker    Types: Cigarettes    Last attempt to quit: 11/20/2006    Years since quitting: 11.2  Substance and Sexual Activity  . Alcohol use: No  . Drug use: No  . Sexual activity: Not on file

## 2018-02-20 ENCOUNTER — Telehealth (INDEPENDENT_AMBULATORY_CARE_PROVIDER_SITE_OTHER): Payer: Self-pay | Admitting: Orthopaedic Surgery

## 2018-02-20 NOTE — Telephone Encounter (Signed)
Pt wife called stated her husband is in a lot of pain. Pt is having hip and back pain.

## 2018-02-21 NOTE — Telephone Encounter (Signed)
Can you make appt for patient

## 2018-02-25 ENCOUNTER — Ambulatory Visit (INDEPENDENT_AMBULATORY_CARE_PROVIDER_SITE_OTHER): Payer: 59 | Admitting: Orthopaedic Surgery

## 2018-02-25 DIAGNOSIS — G8929 Other chronic pain: Secondary | ICD-10-CM

## 2018-02-25 DIAGNOSIS — M5441 Lumbago with sciatica, right side: Secondary | ICD-10-CM

## 2018-02-25 MED ORDER — HYDROCODONE-ACETAMINOPHEN 5-325 MG PO TABS: 1.0000 | ORAL_TABLET | Freq: Every day | ORAL | 0 refills | Status: DC | PRN

## 2018-02-25 NOTE — Progress Notes (Signed)
   Office Visit Note   Patient: Richard Barton           Date of Birth: Jul 09, 1956           MRN: [id number] Visit Date: 02/25/2018              Requested by: Haywood Pao, MD [address] [place], [place] [address] PCP: Haywood Pao, MD   Assessment & Plan: Visit Diagnoses:  1. Chronic right-sided low back pain with right-sided sciatica     Plan: Impression is continued lumbar spine pain suspect lumbar spondylosis and degenerative disc disease.  He has failed conservative treatment.  Recommend MRI of the lumbar spine to rule out structural abnormalities.  Small supply of hydrocodone prescribed today.  Follow-Up Instructions: Return in about 2 weeks (around 03/11/2018).   Orders:  Orders Placed This Encounter  Procedures  . MR Lumbar Spine w/o contrast   Meds ordered this encounter  Medications  . HYDROcodone-acetaminophen (NORCO) 5-325 MG tablet    Sig: Take 1-2 tablets by mouth daily as needed.    Dispense:  15 tablet    Refill:  0      Procedures: No procedures performed   Clinical Data: No additional findings.   Subjective: Chief Complaint  Patient presents with  . Lower Back - Pain    Patient follows up today for continued right hip and back pain.  He does not have any true radicular symptoms.   Review of Systems   Objective: Vital Signs: There were no vitals taken for this visit.  Physical Exam  Ortho Exam Negative straight leg. Specialty Comments:  No specialty comments available.  Imaging: No results found.   PMFS History: Patient Active Problem List   Diagnosis Date Noted  . Chronic right shoulder pain 02/14/2018  . Chronic right-sided low back pain with right-sided sciatica 02/14/2018  . Benign neoplasm of heart 08/07/2011  . Unspecified pleural effusion 08/07/2011  . Left Atrial myxoma   . HTN (hypertension)   . DM2 (diabetes mellitus, type 2) ([place])   . Renal insufficiency    Past Medical History:  Diagnosis Date    . Atrial flutter ([place])   . Atrial myxoma    Left  . Diabetes mellitus type II   . DM2 (diabetes mellitus, type 2) ([place])   . HTN (hypertension)   . Hyperlipidemia   . Renal insufficiency     No family history on file.  Past Surgical History:  Procedure Laterality Date  . CARDIAC CATHETERIZATION    . cardioversion of atrial flutter.  07/31/11  . Right miniature thoracotomy for resection of left atrial myxoma.  06/09/11   Dr Roxy Manns   Social History   Occupational History  . Occupation: IT sales professional: SELF-EMPLOYED  Tobacco Use  . Smoking status: Former Smoker    Types: Cigarettes    Last attempt to quit: 11/20/2006    Years since quitting: 11.2  Substance and Sexual Activity  . Alcohol use: No  . Drug use: No  . Sexual activity: Not on file

## 2018-03-04 DIAGNOSIS — R82998 Other abnormal findings in urine: Secondary | ICD-10-CM | POA: Diagnosis not present

## 2018-03-04 DIAGNOSIS — Z0000 Encounter for general adult medical examination without abnormal findings: Secondary | ICD-10-CM | POA: Diagnosis not present

## 2018-03-06 DIAGNOSIS — E1129 Type 2 diabetes mellitus with other diabetic kidney complication: Secondary | ICD-10-CM | POA: Diagnosis not present

## 2018-03-06 DIAGNOSIS — Z1389 Encounter for screening for other disorder: Secondary | ICD-10-CM | POA: Diagnosis not present

## 2018-03-06 DIAGNOSIS — Z0000 Encounter for general adult medical examination without abnormal findings: Secondary | ICD-10-CM | POA: Diagnosis not present

## 2018-03-06 DIAGNOSIS — I1310 Hypertensive heart and chronic kidney disease without heart failure, with stage 1 through stage 4 chronic kidney disease, or unspecified chronic kidney disease: Secondary | ICD-10-CM | POA: Diagnosis not present

## 2018-03-06 DIAGNOSIS — Z794 Long term (current) use of insulin: Secondary | ICD-10-CM | POA: Diagnosis not present

## 2018-03-20 ENCOUNTER — Other Ambulatory Visit: Payer: Self-pay

## 2018-04-01 ENCOUNTER — Ambulatory Visit (INDEPENDENT_AMBULATORY_CARE_PROVIDER_SITE_OTHER): Payer: Self-pay | Admitting: Orthopaedic Surgery

## 2018-05-08 DIAGNOSIS — H401133 Primary open-angle glaucoma, bilateral, severe stage: Secondary | ICD-10-CM | POA: Diagnosis not present

## 2018-07-17 DIAGNOSIS — Z23 Encounter for immunization: Secondary | ICD-10-CM | POA: Diagnosis not present

## 2018-08-07 DIAGNOSIS — N183 Chronic kidney disease, stage 3 (moderate): Secondary | ICD-10-CM | POA: Diagnosis not present

## 2018-08-07 DIAGNOSIS — I129 Hypertensive chronic kidney disease with stage 1 through stage 4 chronic kidney disease, or unspecified chronic kidney disease: Secondary | ICD-10-CM | POA: Diagnosis not present

## 2018-08-07 DIAGNOSIS — E1122 Type 2 diabetes mellitus with diabetic chronic kidney disease: Secondary | ICD-10-CM | POA: Diagnosis not present

## 2018-09-05 DIAGNOSIS — I1310 Hypertensive heart and chronic kidney disease without heart failure, with stage 1 through stage 4 chronic kidney disease, or unspecified chronic kidney disease: Secondary | ICD-10-CM | POA: Diagnosis not present

## 2018-09-05 DIAGNOSIS — E1129 Type 2 diabetes mellitus with other diabetic kidney complication: Secondary | ICD-10-CM | POA: Diagnosis not present

## 2018-09-05 DIAGNOSIS — N183 Chronic kidney disease, stage 3 (moderate): Secondary | ICD-10-CM | POA: Diagnosis not present

## 2018-09-16 ENCOUNTER — Other Ambulatory Visit: Payer: Self-pay | Admitting: Cardiology

## 2018-09-16 NOTE — Telephone Encounter (Signed)
° ° °  1. Which medications need to be refilled? (please list name of each medication and dose if known) Byostolic 10mg   2. Which pharmacy/location (including street and city if local pharmacy) is medication to be sent to? [facility]  3. Do they need a 30 day or 90 day supply? [place]

## 2018-09-17 NOTE — Telephone Encounter (Signed)
Contacted patient as he has his yearly follow up appointment scheduled on 09/19/18 with Dr. Bettina Gavia at 1:20 pm. Patient states he has already received his November medication, so he will not run out before his appointment. Explained to him that further refills would be provided during office visit. Patient verbalized understanding. No further questions.

## 2018-09-18 NOTE — Progress Notes (Signed)
Cardiology Office Note:    Date:  09/19/2018   ID:  Richard Barton, DOB 30-Aug-1956, MRN [id number]  PCP:  Haywood Pao, MD  Cardiologist:  Shirlee More, MD    Referring MD: Haywood Pao, MD    ASSESSMENT:    1. Hypertensive heart and chronic kidney disease without heart failure, unspecified CKD stage   2. Left Atrial myxoma   3. Abnormal EKG    PLAN:    In order of problems listed above:  1. Blood pressure not at target I asked him to fully sodium restrict encourage weight loss increase minoxidil and continue his other medications.  He will purchase a blood pressure cuff and start checking at home 2. Stable no evidence of recurrence 3. Secondary to severe LVH confirmed on his echocardiogram   Next appointment: 6 months Dr. Wynonia Lawman   Medication Adjustments/Labs and Tests Ordered: Current medicines are reviewed at length with the patient today.  Concerns regarding medicines are outlined above.  Orders Placed This Encounter  Procedures  . EKG 12-Lead   Meds ordered this encounter  Medications  . DISCONTD: minoxidil (LONITEN) 2.5 MG tablet    Sig: Take 2 tablets (5 mg total) by mouth 2 (two) times daily.    Dispense:  60 tablet    Refill:  6  . nebivolol (BYSTOLIC) 10 MG tablet    Sig: Take 2 tablets (20 mg total) by mouth daily.    Dispense:  60 tablet    Refill:  6  . minoxidil (LONITEN) 10 MG tablet    Sig: Take 1 tablet (10 mg total) by mouth 2 (two) times daily.    Dispense:  30 tablet    Refill:  6    Chief Complaint  Patient presents with  . Follow-up    left atrial myxoma resection  . Hypertension    History of Present Illness:    Richard Barton is a 62 y.o. male with a hx of LA myxoma with resection 2012, hypertension and CKD last seen by Dr. Wynonia Lawman 08/31/2017.  At that time an echocardiogram was ordered with a background history of myxoma.. Compliance with diet, lifestyle and medications: Yes  He follows with nephrology for CKD and  hypertension recent put on loop diuretic.  He said his recent kidney function is improved we will try to access those labs.  Unfortunately does not check his home blood pressure and today his systolic exceeds 782.  He does not have fluid overload takes a beta-blocker and I will increase the dose of his minoxidil.  Strongly encouraged him to purchase a blood pressure cuff and check regularly with a goal systolic less than 956 at high risk.  His echocardiogram after last visit reviewed he has severe concentric LVH confirmed on his EKG no recurrence of myxoma.  He has had no shortness of breath edema chest pain palpitation or systemic symptoms from myxoma Past Medical History:  Diagnosis Date  . Atrial flutter ([place])   . Atrial myxoma    Left  . Benign neoplasm of heart 08/07/2011  . Chronic right shoulder pain 02/14/2018  . Chronic right-sided low back pain with right-sided sciatica 02/14/2018  . Diabetes mellitus type II   . DM2 (diabetes mellitus, type 2) ([place])   . HTN (hypertension)   . Hyperlipidemia   . Renal insufficiency   . Unspecified pleural effusion 08/07/2011    Past Surgical History:  Procedure Laterality Date  . CARDIAC CATHETERIZATION    . cardioversion of  atrial flutter.  07/31/11  . Right miniature thoracotomy for resection of left atrial myxoma.  06/09/11   Dr Roxy Manns    Current Medications: Current Meds  Medication Sig  . amLODipine (NORVASC) 10 MG tablet Take 10 mg by mouth daily.    Marland Kitchen aspirin 81 MG tablet Take 81 mg by mouth daily.    Marland Kitchen aspirin-acetaminophen-caffeine (EXCEDRIN MIGRAINE) 250-250-65 MG per tablet Take 1 tablet by mouth every 6 (six) hours as needed.    . benazepril (LOTENSIN) 40 MG tablet Take 40 mg by mouth daily.    . canagliflozin (INVOKANA) 300 MG TABS tablet Take 300 mg by mouth daily before breakfast.  . desoximetasone (TOPICORT) 0.05 % cream Apply topically 2 (two) times daily.    . diphenhydrAMINE (BENADRYL) 25 MG tablet Take 25 mg by mouth at bedtime  as needed.    Marland Kitchen esomeprazole (NEXIUM) 40 MG capsule Take 40 mg by mouth daily before breakfast.    . fenofibrate (TRICOR) 145 MG tablet Take 145 mg by mouth daily.    . furosemide (LASIX) 40 MG tablet Take 40 mg by mouth 2 (two) times daily.   Marland Kitchen glimepiride (AMARYL) 4 MG tablet Take 4 mg by mouth daily before breakfast.    . guaiFENesin (MUCINEX) 600 MG 12 hr tablet Take 1,200 mg by mouth as needed.    . Insulin Glargine (BASAGLAR KWIKPEN) 100 UNIT/ML SOPN Inject 60 Units into the skin 2 (two) times daily.  Marland Kitchen latanoprost (XALATAN) 0.005 % ophthalmic solution Place 1 drop into both eyes at bedtime.  . minoxidil (LONITEN) 10 MG tablet Take 1 tablet (10 mg total) by mouth 2 (two) times daily.  . nebivolol (BYSTOLIC) 10 MG tablet Take 2 tablets (20 mg total) by mouth daily.  . sitaGLIPtin (JANUVIA) 100 MG tablet Take 100 mg by mouth daily.    . tadalafil (CIALIS) 10 MG tablet Take 10 mg by mouth daily as needed.    . [DISCONTINUED] minoxidil (LONITEN) 2.5 MG tablet Take 2.5 mg by mouth 2 (two) times daily.    . [DISCONTINUED] minoxidil (LONITEN) 2.5 MG tablet Take 2 tablets (5 mg total) by mouth 2 (two) times daily.  . [DISCONTINUED] nebivolol (BYSTOLIC) 10 MG tablet Take 20 mg by mouth daily.       Allergies:   Patient has no known allergies.   Social History   Socioeconomic History  . Marital status: Married    Spouse name: Not on file  . Number of children: Not on file  . Years of education: Not on file  . Highest education level: Not on file  Occupational History  . Occupation: IT sales professional: SELF-EMPLOYED  Social Needs  . Financial resource strain: Not on file  . Food insecurity:    Worry: Not on file    Inability: Not on file  . Transportation needs:    Medical: Not on file    Non-medical: Not on file  Tobacco Use  . Smoking status: Former Smoker    Types: Cigarettes    Last attempt to quit: 11/20/2006    Years since quitting: 11.8  . Smokeless tobacco: Never Used    Substance and Sexual Activity  . Alcohol use: No  . Drug use: No  . Sexual activity: Not on file  Lifestyle  . Physical activity:    Days per week: Not on file    Minutes per session: Not on file  . Stress: Not on file  Relationships  . Social connections:  Talks on phone: Not on file    Gets together: Not on file    Attends religious service: Not on file    Active member of club or organization: Not on file    Attends meetings of clubs or organizations: Not on file    Relationship status: Not on file  Other Topics Concern  . Not on file  Social History Narrative  . Not on file     Family History: The patient's family history includes CAD in his father. ROS:   Please see the history of present illness.    All other systems reviewed and are negative.  EKGs/Labs/Other Studies Reviewed:    The following studies were reviewed today:  EKG:  EKG ordered today.  The ekg ordered today demonstrates sinus rhythm marked LVH and repolarization changes  Recent Labs:  Chol 179 HDL 29 LDL 127 Cr 1.8 No results found for requested labs within last 8760 hours.  Recent Lipid Panel No results found for: CHOL, TRIG, HDL, CHOLHDL, VLDL, LDLCALC, LDLDIRECT  Physical Exam:    VS:  BP (!) 168/92 (BP Location: Right Arm, Patient Position: Sitting, Cuff Size: Large)   Pulse 74   Ht 5\' 11"  (1.803 m)   Wt 251 lb 0.6 oz (113.9 kg)   SpO2 96%   BMI 35.01 kg/m     Wt Readings from Last 3 Encounters:  09/19/18 251 lb 0.6 oz (113.9 kg)  11/06/11 226 lb (102.5 kg)  08/07/11 214 lb (97.1 kg)     GEN:  Well nourished, well developed in no acute distress HEENT: Normal NECK: No JVD; No carotid bruits LYMPHATICS: No lymphadenopathy CARDIAC: RRR, no murmurs, rubs, gallops RESPIRATORY:  Clear to auscultation without rales, wheezing or rhonchi  ABDOMEN: Soft, non-tender, non-distended MUSCULOSKELETAL:  No edema; No deformity  SKIN: Warm and dry NEUROLOGIC:  Alert and oriented x  3 PSYCHIATRIC:  Normal affect    Signed, Shirlee More, MD  09/19/2018 2:21 PM    [place]

## 2018-09-19 ENCOUNTER — Encounter: Payer: Self-pay | Admitting: Cardiology

## 2018-09-19 ENCOUNTER — Ambulatory Visit: Payer: 59 | Admitting: Cardiology

## 2018-09-19 VITALS — BP 168/92 | HR 74 | Ht 71.0 in | Wt 251.0 lb

## 2018-09-19 DIAGNOSIS — R9431 Abnormal electrocardiogram [ECG] [EKG]: Secondary | ICD-10-CM

## 2018-09-19 DIAGNOSIS — D151 Benign neoplasm of heart: Secondary | ICD-10-CM | POA: Diagnosis not present

## 2018-09-19 DIAGNOSIS — I1310 Hypertensive heart and chronic kidney disease without heart failure, with stage 1 through stage 4 chronic kidney disease, or unspecified chronic kidney disease: Secondary | ICD-10-CM

## 2018-09-19 MED ORDER — MINOXIDIL 2.5 MG PO TABS: 5.0000 mg | ORAL_TABLET | Freq: Two times a day (BID) | ORAL | 6 refills | Status: DC

## 2018-09-19 MED ORDER — NEBIVOLOL HCL 10 MG PO TABS: 20.0000 mg | ORAL_TABLET | Freq: Every day | ORAL | 6 refills | Status: DC

## 2018-09-19 MED ORDER — MINOXIDIL 10 MG PO TABS: 10.0000 mg | ORAL_TABLET | Freq: Two times a day (BID) | ORAL | 6 refills | Status: DC

## 2018-09-19 NOTE — Patient Instructions (Addendum)
Medication Instructions:  Your physician has recommended you make the following change in your medication:  INCREASE minoxidil (loniten) 10 mg: Take 1 tablet twice daily  If you need a refill on your cardiac medications before your next appointment, please call your pharmacy.   Lab work: None  If you have labs (blood work) drawn today and your tests are completely normal, you will receive your results only by: Marland Kitchen MyChart Message (if you have MyChart) OR . A paper copy in the mail If you have any lab test that is abnormal or we need to change your treatment, we will call you to review the results.  Testing/Procedures: You had an EKG today.   Follow-Up: At [facility], you and your health needs are our priority.  As part of our continuing mission to provide you with exceptional heart care, we have created designated Provider Care Teams.  These Care Teams include your primary Cardiologist (physician) and Advanced Practice Providers (APPs -  Physician Assistants and Nurse Practitioners) who all work together to provide you with the care you need, when you need it. You will need a follow up appointment in 6 months.  Please call our office 2 months in advance to schedule this appointment.

## 2018-12-13 ENCOUNTER — Other Ambulatory Visit: Payer: Self-pay | Admitting: Cardiology

## 2019-03-05 DIAGNOSIS — Z0000 Encounter for general adult medical examination without abnormal findings: Secondary | ICD-10-CM | POA: Diagnosis not present

## 2019-03-10 DIAGNOSIS — I1310 Hypertensive heart and chronic kidney disease without heart failure, with stage 1 through stage 4 chronic kidney disease, or unspecified chronic kidney disease: Secondary | ICD-10-CM | POA: Diagnosis not present

## 2019-03-10 DIAGNOSIS — Z86018 Personal history of other benign neoplasm: Secondary | ICD-10-CM | POA: Diagnosis not present

## 2019-03-10 DIAGNOSIS — E7800 Pure hypercholesterolemia, unspecified: Secondary | ICD-10-CM | POA: Diagnosis not present

## 2019-03-11 ENCOUNTER — Telehealth: Payer: Self-pay

## 2019-03-11 NOTE — Telephone Encounter (Signed)
YOUR CARDIOLOGY TEAM HAS ARRANGED FOR AN E-VISIT FOR YOUR APPOINTMENT - PLEASE REVIEW IMPORTANT INFORMATION BELOW SEVERAL DAYS PRIOR TO YOUR APPOINTMENT  Due to the recent COVID-19 pandemic, we are transitioning in-person office visits to tele-medicine visits in an effort to decrease unnecessary exposure to our patients, their families, and staff. These visits are billed to your insurance just like a normal visit is. We also encourage you to sign up for MyChart if you have not already done so. You will need a smartphone if possible. For patients that do not have this, we can still complete the visit using a regular telephone but do prefer a smartphone to enable video when possible. You may have a family member that lives with you that can help. If possible, we also ask that you have a blood pressure cuff and scale at home to measure your blood pressure, heart rate and weight prior to your scheduled appointment. Patients with clinical needs that need an in-person evaluation and testing will still be able to come to the office if absolutely necessary. If you have any questions, feel free to call our office.     YOUR PROVIDER WILL BE USING THE FOLLOWING PLATFORM TO COMPLETE YOUR VISIT: Doxy.Me   . IF USING DOXIMITY or [email or web address].ME - The staff will give you instructions on receiving your link to join the meeting the day of your visit.      2-3 DAYS BEFORE YOUR APPOINTMENT  You will receive a telephone call from one of our [facility] team members - your caller ID may say "Unknown caller." If this is a video visit, we will walk you through how to get the video launched on your phone. We will remind you check your blood pressure, heart rate and weight prior to your scheduled appointment. If you have an Apple Watch or Kardia, please upload any pertinent ECG strips the day before or morning of your appointment to [facility]. Our staff will also make sure you have reviewed the consent and agree to move forward with  your scheduled tele-health visit.     THE DAY OF YOUR APPOINTMENT  Approximately 15 minutes prior to your scheduled appointment, you will receive a telephone call from one of [facility] team - your caller ID may say "Unknown caller."  Our staff will confirm medications, vital signs for the day and any symptoms you may be experiencing. Please have this information available prior to the time of visit start. It may also be helpful for you to have a pad of paper and pen handy for any instructions given during your visit. They will also walk you through joining the smartphone meeting if this is a video visit.    CONSENT FOR TELE-HEALTH VISIT - PLEASE REVIEW  I hereby voluntarily request, consent and authorize [facility] and its employed or contracted physicians, physician assistants, nurse practitioners or other licensed health care professionals (the Practitioner), to provide me with telemedicine health care services (the "Services") as deemed necessary by the treating Practitioner. I acknowledge and consent to receive the Services by the Practitioner via telemedicine. I understand that the telemedicine visit will involve communicating with the Practitioner through live audiovisual communication technology and the disclosure of certain medical information by electronic transmission. I acknowledge that I have been given the opportunity to request an in-person assessment or other available alternative prior to the telemedicine visit and am voluntarily participating in the telemedicine visit.  I understand that I have the right to withhold or withdraw my consent to  the use of telemedicine in the course of my care at any time, without affecting my right to future care or treatment, and that the Practitioner or I may terminate the telemedicine visit at any time. I understand that I have the right to inspect all information obtained and/or recorded in the course of the telemedicine visit and may receive  copies of available information for a reasonable fee.  I understand that some of the potential risks of receiving the Services via telemedicine include:  Marland Kitchen Delay or interruption in medical evaluation due to technological equipment failure or disruption; . Information transmitted may not be sufficient (e.g. poor resolution of images) to allow for appropriate medical decision making by the Practitioner; and/or  . In rare instances, security protocols could fail, causing a breach of personal health information.  Furthermore, I acknowledge that it is my responsibility to provide information about my medical history, conditions and care that is complete and accurate to the best of my ability. I acknowledge that Practitioner's advice, recommendations, and/or decision may be based on factors not within their control, such as incomplete or inaccurate data provided by me or distortions of diagnostic images or specimens that may result from electronic transmissions. I understand that the practice of medicine is not an exact science and that Practitioner makes no warranties or guarantees regarding treatment outcomes. I acknowledge that I will receive a copy of this consent concurrently upon execution via email to the email address I last provided but may also request a printed copy by calling the office of [facility].    I understand that my insurance will be billed for this visit.   I have read or had this consent read to me. . I understand the contents of this consent, which adequately explains the benefits and risks of the Services being provided via telemedicine.  . I have been provided ample opportunity to ask questions regarding this consent and the Services and have had my questions answered to my satisfaction. . I give my informed consent for the services to be provided through the use of telemedicine in my medical care  By participating in this telemedicine visit I agree to the above.  Patient gave  verbal consent for virtual visit with Dr Bettina Gavia on 03-13-2019.

## 2019-03-13 ENCOUNTER — Other Ambulatory Visit: Payer: Self-pay

## 2019-03-13 ENCOUNTER — Telehealth: Payer: 59 | Admitting: Cardiology

## 2019-03-18 NOTE — Progress Notes (Signed)
Virtual Visit via Video Note   This visit type was conducted due to national recommendations for restrictions regarding the COVID-19 Pandemic (e.g. social distancing) in an effort to limit this patient's exposure and mitigate transmission in our community.  Due to his co-morbid illnesses, this patient is at least at moderate risk for complications without adequate follow up.  This format is felt to be most appropriate for this patient at this time.  All issues noted in this document were discussed and addressed.  A limited physical exam was performed with this format.  Please refer to the patient's chart for his consent to telehealth for [facility].   Evaluation Performed:  Follow-up visit  Date:  03/18/2019   ID:  Richard Barton, DOB 05-07-1956, MRN [id number]  Patient Location: Home Provider Location: Home  PCP:  Tisovec, Fransico Him, MD  Cardiologist:  No primary care provider on file. Dr Bettina Gavia Electrophysiologist:  None   Chief Complaint:  FU after LA myxoma resection and hypertensive heart disease  Care Team and Communications   Referring Provider   Tisovec, Fransico Him, MD PCPs   Type  Tisovec, Fransico Him, MD General  Other Patient Care Team Members Specialty  Rexene Alberts, MD Cardiothoracic Surgery  Corliss Parish, MD Nephrology  Richardo Priest, MD Cardiology   History of Present Illness:    Richard Barton is a 63 y.o. male with a hx of LA myxoma with resection 2012, hypertension with severe LVH and CKD last seen  09/19/18. Echo in 2018 showed severe LVH and no recurrence of myxoma.  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  He has mild exertional dyspnea walking outdoors longer distance has intermittent edema but not often no orthopnea chest pain palpitation or syncope.  He had a wellness exam and full labs a few weeks ago copy requested from his PCP and I will put an addendum to this note.  Obviously we cannot do a EKG today.   Unfortunately he did not buy blood pressure cuff he has very severe resistant hypertension on 5 drugs 1 of which is minoxidil and needs to check his blood pressure frequently asked him to purchase a digital device large adult cuff and start to record daily and bring with his next visit.  The patient does not have symptoms concerning for COVID-19 infection (fever, chills, cough, or new shortness of breath).    Past Medical History:  Diagnosis Date  . Atrial flutter ([place])   . Atrial myxoma    Left  . Benign neoplasm of heart 08/07/2011  . Chronic right shoulder pain 02/14/2018  . Chronic right-sided low back pain with right-sided sciatica 02/14/2018  . Diabetes mellitus type II   . DM2 (diabetes mellitus, type 2) ([place])   . HTN (hypertension)   . Hyperlipidemia   . Renal insufficiency   . Unspecified pleural effusion 08/07/2011   Past Surgical History:  Procedure Laterality Date  . CARDIAC CATHETERIZATION    . cardioversion of atrial flutter.  07/31/11  . Right miniature thoracotomy for resection of left atrial myxoma.  06/09/11   Dr Roxy Manns     No outpatient medications have been marked as taking for the 03/19/19 encounter (Appointment) with Richardo Priest, MD.     Allergies:   Patient has no known allergies.   Social History   Tobacco Use  . Smoking status: Former Smoker    Types: Cigarettes    Last attempt to quit:  11/20/2006    Years since quitting: 12.3  . Smokeless tobacco: Never Used  Substance Use Topics  . Alcohol use: No  . Drug use: No     Family Hx: The patient's family history includes CAD in his father.  ROS:   Please see the history of present illness.     All other systems reviewed and are negative.   Prior CV studies:   The following studies were reviewed today:    Labs/Other Tests and Data Reviewed:    EKG:  No ECG reviewed.  Recent Labs: From a few weeks ago requested from his PCP  Wt Readings from Last 3 Encounters:  09/19/18 251 lb 0.6 oz  (113.9 kg)  11/06/11 226 lb (102.5 kg)  08/07/11 214 lb (97.1 kg)     Objective:    Vital Signs:  There were no vitals taken for this visit.   Constitutional, well-nourished well-developed in no acute distress Vital signs reviewed Eyes, conjunctiva and sclera are normal without pallor or icterus extraocular motions intact and normal there is no lid lag Respiratory, normal effort and excursion no audible wheezing without a stethoscope Cardiovascular, no neck vein distention or peripheral edema Skin, no rash skin lesion or ulceration of the extremities Neurologic, cranial nerves II to XII are grossly intact and the patient moves all 4 extremities Neuro/Psychiatric, judgment and thought processes are intact and coherent, alert and oriented x3, mood and affect appear normal.  ASSESSMENT & PLAN:    1. History of left atrial myxoma resection 2012 no recurrence on echocardiogram 2018 and I do not think he requires repeat imaging at this time for that reason. 2. Hypertensive heart and chronic kidney disease he certainly has resistant severe hypertension he is on 5 drugs including minoxidil and really needs to take control purchase a digital device large cuff and record and become actively involved in his care.  I stressed the need for activity and sodium restriction.  He certainly is at risk for progressive renal dysfunction as well as heart failure and I will review his labs when they are received and put an addendum to this note 3. Type 2 diabetes he is certainly on appropriate cardioprotective medications with SGLT2 inhibitor.  COVID-19 Education: The signs and symptoms of COVID-19 were discussed with the patient and how to seek care for testing (follow up with PCP or arrange E-visit).  The importance of social distancing was discussed today.  Time:   Today, I have spent 25 minutes with the patient with telehealth technology discussing the above problems.     Medication Adjustments/Labs and  Tests Ordered: Current medicines are reviewed at length with the patient today.  Concerns regarding medicines are outlined above.   Tests Ordered:  No orders of the defined types were placed in this encounter.   Medication Changes: No orders of the defined types were placed in this encounter.   Disposition:  Follow up in 6 month(s)  Signed, Shirlee More, MD  03/18/2019 3:08 PM    [facility]

## 2019-03-19 ENCOUNTER — Telehealth (INDEPENDENT_AMBULATORY_CARE_PROVIDER_SITE_OTHER): Payer: 59 | Admitting: Cardiology

## 2019-03-19 ENCOUNTER — Encounter: Payer: Self-pay | Admitting: Cardiology

## 2019-03-19 ENCOUNTER — Encounter: Payer: Self-pay | Admitting: *Deleted

## 2019-03-19 ENCOUNTER — Other Ambulatory Visit: Payer: Self-pay

## 2019-03-19 VITALS — Ht 71.0 in | Wt 255.0 lb

## 2019-03-19 DIAGNOSIS — D151 Benign neoplasm of heart: Secondary | ICD-10-CM | POA: Diagnosis not present

## 2019-03-19 DIAGNOSIS — I1310 Hypertensive heart and chronic kidney disease without heart failure, with stage 1 through stage 4 chronic kidney disease, or unspecified chronic kidney disease: Secondary | ICD-10-CM

## 2019-03-19 DIAGNOSIS — E119 Type 2 diabetes mellitus without complications: Secondary | ICD-10-CM

## 2019-03-19 NOTE — Patient Instructions (Addendum)
Medication Instructions:  Your physician recommends that you continue on your current medications as directed. Please refer to the Current Medication list given to you today.  If you need a refill on your cardiac medications before your next appointment, please call your pharmacy.   Lab work: None  If you have labs (blood work) drawn today and your tests are completely normal, you will receive your results only by: Marland Kitchen MyChart Message (if you have MyChart) OR . A paper copy in the mail If you have any lab test that is abnormal or we need to change your treatment, we will call you to review the results.  Testing/Procedures: None  Follow-Up: At [facility], you and your health needs are our priority.  As part of our continuing mission to provide you with exceptional heart care, we have created designated Provider Care Teams.  These Care Teams include your primary Cardiologist (physician) and Advanced Practice Providers (APPs -  Physician Assistants and Nurse Practitioners) who all work together to provide you with the care you need, when you need it. . You will need a follow up appointment in 6 months: Wednesday, 09/17/2019, at 1:20 pm in the [facility] office. Please arrive at 1:05 pm.   Any Other Special Instructions Will Be Listed Below (If Applicable).  **Please purchase a digital blood pressure machine with a large adult cuff and check your BP daily at the same time each day. Record these readings and bring this log with you to your next appointment for Dr. Bettina Gavia to review.

## 2019-03-20 NOTE — Progress Notes (Signed)
Recent labs received from his primary care physician reviewed and are reassuring and stable. A1c 6.3% CMP shows a creatinine of 1.7 GFR 41 cc potassium 4.1 Cholesterol 141 triglycerides 174 HDL 34 LDL 71  No change in treatment.

## 2019-04-12 ENCOUNTER — Other Ambulatory Visit: Payer: Self-pay | Admitting: Cardiology

## 2019-06-11 ENCOUNTER — Other Ambulatory Visit: Payer: Self-pay | Admitting: Cardiology

## 2019-06-12 NOTE — Telephone Encounter (Signed)
Minoxidil refill sent

## 2019-08-22 ENCOUNTER — Encounter: Payer: Self-pay | Admitting: Gastroenterology

## 2019-09-10 NOTE — Progress Notes (Deleted)
Cardiology Office Note:    Date:  09/10/2019   ID:  Richard Barton, DOB July 27, 1956, MRN [id number]  PCP:  Haywood Pao, MD  Cardiologist:  Shirlee More, MD    Referring MD: Haywood Pao, MD    ASSESSMENT:    No diagnosis found. PLAN:    In order of problems listed above:  1. ***   Next appointment: ***   Medication Adjustments/Labs and Tests Ordered: Current medicines are reviewed at length with the patient today.  Concerns regarding medicines are outlined above.  No orders of the defined types were placed in this encounter.  No orders of the defined types were placed in this encounter.   No chief complaint on file.   History of Present Illness:    Richard Barton is a 63 y.o. male with a hx of LA myxoma with resection 2012, hypertension with severe LVH and CKD    last seen 03/19/2019. Compliance with diet, lifestyle and medications: *** Past Medical History:  Diagnosis Date  . Atrial flutter ([place])   . Atrial myxoma    Left  . Benign neoplasm of heart 08/07/2011  . Chronic right shoulder pain 02/14/2018  . Chronic right-sided low back pain with right-sided sciatica 02/14/2018  . Diabetes mellitus type II   . DM2 (diabetes mellitus, type 2) ([place])   . HTN (hypertension)   . Hyperlipidemia   . Renal insufficiency   . Unspecified pleural effusion 08/07/2011    Past Surgical History:  Procedure Laterality Date  . CARDIAC CATHETERIZATION    . cardioversion of atrial flutter.  07/31/11  . Right miniature thoracotomy for resection of left atrial myxoma.  06/09/11   Dr Roxy Manns    Current Medications: No outpatient medications have been marked as taking for the 09/11/19 encounter (Appointment) with Richardo Priest, MD.     Allergies:   Patient has no known allergies.   Social History   Socioeconomic History  . Marital status: Married    Spouse name: Not on file  . Number of children: Not on file  . Years of education: Not on file  . Highest education  level: Not on file  Occupational History  . Occupation: IT sales professional: SELF-EMPLOYED  Social Needs  . Financial resource strain: Not on file  . Food insecurity    Worry: Not on file    Inability: Not on file  . Transportation needs    Medical: Not on file    Non-medical: Not on file  Tobacco Use  . Smoking status: Former Smoker    Types: Cigarettes    Quit date: 11/20/2006    Years since quitting: 12.8  . Smokeless tobacco: Never Used  Substance and Sexual Activity  . Alcohol use: No  . Drug use: No  . Sexual activity: Not on file  Lifestyle  . Physical activity    Days per week: Not on file    Minutes per session: Not on file  . Stress: Not on file  Relationships  . Social Herbalist on phone: Not on file    Gets together: Not on file    Attends religious service: Not on file    Active member of club or organization: Not on file    Attends meetings of clubs or organizations: Not on file    Relationship status: Not on file  Other Topics Concern  . Not on file  Social History Narrative  . Not on file  Family History: The patient's ***family history includes CAD in his father. ROS:   Please see the history of present illness.    All other systems reviewed and are negative.  EKGs/Labs/Other Studies Reviewed:    The following studies were reviewed today:  EKG:  EKG ordered today and personally reviewed.  The ekg ordered today demonstrates ***  Recent Labs: No results found for requested labs within last 8760 hours.  Recent Lipid Panel No results found for: CHOL, TRIG, HDL, CHOLHDL, VLDL, LDLCALC, LDLDIRECT  Physical Exam:    VS:  There were no vitals taken for this visit.    Wt Readings from Last 3 Encounters:  03/19/19 255 lb (115.7 kg)  09/19/18 251 lb 0.6 oz (113.9 kg)  11/06/11 226 lb (102.5 kg)     GEN: *** Well nourished, well developed in no acute distress HEENT: Normal NECK: No JVD; No carotid bruits LYMPHATICS: No  lymphadenopathy CARDIAC: ***RRR, no murmurs, rubs, gallops RESPIRATORY:  Clear to auscultation without rales, wheezing or rhonchi  ABDOMEN: Soft, non-tender, non-distended MUSCULOSKELETAL:  No edema; No deformity  SKIN: Warm and dry NEUROLOGIC:  Alert and oriented x 3 PSYCHIATRIC:  Normal affect    Signed, Shirlee More, MD  09/10/2019 7:30 PM    [facility]

## 2019-09-11 ENCOUNTER — Ambulatory Visit: Payer: 59 | Admitting: Family

## 2019-09-17 ENCOUNTER — Ambulatory Visit: Payer: 59 | Admitting: Cardiology

## 2019-10-07 ENCOUNTER — Other Ambulatory Visit: Payer: Self-pay | Admitting: Cardiology

## 2019-10-07 NOTE — Telephone Encounter (Signed)
30 day supply sent.  Overdue for follow up.

## 2019-11-02 ENCOUNTER — Other Ambulatory Visit: Payer: Self-pay | Admitting: Family

## 2019-11-03 ENCOUNTER — Other Ambulatory Visit: Payer: Self-pay | Admitting: Family

## 2019-12-03 ENCOUNTER — Other Ambulatory Visit: Payer: Self-pay | Admitting: Cardiology

## 2020-01-03 ENCOUNTER — Other Ambulatory Visit: Payer: Self-pay | Admitting: Cardiology

## 2020-01-26 ENCOUNTER — Ambulatory Visit: Payer: 59 | Attending: Internal Medicine

## 2020-01-26 DIAGNOSIS — Z23 Encounter for immunization: Secondary | ICD-10-CM | POA: Insufficient documentation

## 2020-01-26 NOTE — Progress Notes (Signed)
   Covid-19 Vaccination Clinic  Name:  Richard Barton    MRN: [id number] DOB: [birth year]  01/26/2020  [name] was observed post Covid-19 immunization for 15 minutes without incident. He was provided with Vaccine Information Sheet and instruction to access the V-Safe system.   [name] was instructed to call 911 with any severe reactions post vaccine: Marland Kitchen Difficulty breathing  . Swelling of face and throat  . A fast heartbeat  . A bad rash all over body  . Dizziness and weakness   Immunizations Administered    Name Date Dose VIS Date Route   Pfizer COVID-19 Vaccine 01/26/2020  7:03 PM 0.3 mL 10/31/2019 Intramuscular   Manufacturer: [place]   Lot: [id number]   [place]: [id number]

## 2020-01-27 ENCOUNTER — Other Ambulatory Visit: Payer: Self-pay

## 2020-02-25 ENCOUNTER — Ambulatory Visit: Payer: 59 | Attending: Internal Medicine

## 2020-02-25 DIAGNOSIS — Z23 Encounter for immunization: Secondary | ICD-10-CM

## 2020-02-25 NOTE — Progress Notes (Signed)
   Covid-19 Vaccination Clinic  Name:  YOSHIMI CUTTLER    MRN: [id number] DOB: 08/19/1956  02/25/2020  Mr. Kerwin was observed post Covid-19 immunization for 15 minutes without incident. He was provided with Vaccine Information Sheet and instruction to access the V-Safe system.   Mr. Massett was instructed to call 911 with any severe reactions post vaccine: Marland Kitchen Difficulty breathing  . Swelling of face and throat  . A fast heartbeat  . A bad rash all over body  . Dizziness and weakness   Immunizations Administered    Name Date Dose VIS Date Route   Pfizer COVID-19 Vaccine 02/25/2020  1:16 PM 0.3 mL 10/31/2019 Intramuscular   Manufacturer: [place]   Lot: [id number]   [place]: [id number]

## 2020-03-18 ENCOUNTER — Ambulatory Visit: Payer: 59 | Admitting: Internal Medicine

## 2020-03-18 ENCOUNTER — Encounter: Payer: Self-pay | Admitting: Internal Medicine

## 2020-03-18 ENCOUNTER — Other Ambulatory Visit: Payer: Self-pay

## 2020-03-18 VITALS — BP 138/78 | HR 73 | Temp 97.6°F | Ht 71.5 in | Wt 246.0 lb

## 2020-03-18 DIAGNOSIS — E785 Hyperlipidemia, unspecified: Secondary | ICD-10-CM | POA: Diagnosis not present

## 2020-03-18 DIAGNOSIS — D151 Benign neoplasm of heart: Secondary | ICD-10-CM | POA: Diagnosis not present

## 2020-03-18 DIAGNOSIS — I10 Essential (primary) hypertension: Secondary | ICD-10-CM

## 2020-03-18 DIAGNOSIS — M79605 Pain in left leg: Secondary | ICD-10-CM | POA: Diagnosis not present

## 2020-03-18 NOTE — Patient Instructions (Signed)
Medication Instructions:  NONE. *If you need a refill on your cardiac medications before your next appointment, please call your pharmacy*   Lab Work: NONE.  Testing/Procedures: WILL BE SCHEDULED AT [place] [address]. Your physician has requested that you have an echocardiogram. Echocardiography is a painless test that uses sound waves to create images of your heart. It provides your doctor with information about the size and shape of your heart and how well your heart's chambers and valves are working. This procedure takes approximately one hour. There are no restrictions for this procedure.   WILL BE SCHEDULED AT [place] [address]. Your physician has requested that you have a lower extremity arterial duplex. This test is an ultrasound of the arteries in the leg. It looks at arterial blood flow in the leg. Allow one hour for Lower Arterial scans. There are no restrictions or special instructions    Follow-Up: At [facility], you and your health needs are our priority.  As part of our continuing mission to provide you with exceptional heart care, we have created designated Provider Care Teams.  These Care Teams include your primary Cardiologist (physician) and Advanced Practice Providers (APPs -  Physician Assistants and Nurse Practitioners) who all work together to provide you with the care you need, when you need it.  We recommend signing up for the patient portal called "MyChart".  Sign up information is provided on this After Visit Summary.  MyChart is used to connect with patients for Virtual Visits (Telemedicine).  Patients are able to view lab/test results, encounter notes, upcoming appointments, etc.  Non-urgent messages can be sent to your provider as well.   To learn more about what you can do with MyChart, go to [email or web address].    Your next appointment:   3 month(s)  The format for your next appointment:   In Person  Provider:   Cherlynn Kaiser, MD

## 2020-03-18 NOTE — Progress Notes (Signed)
Cardiology Office Note:    Date:  03/18/2020   ID:  Richard Barton, DOB [birth year], MRN [id number]  PCP:  Haywood Pao, MD  Cardiologist:  Elouise Munroe, MD  Electrophysiologist:  None   Referring MD: Haywood Pao, MD   Chief Complaint: follow up resected LA myxoma, HTN  History of Present Illness:    Richard Barton is a 64 y.o. male with a history of LA myxoma with resection 2012, hypertension, HLD with severe LVH and CKDlast seen  03/19/2019 by my partner Dr. Bettina Gavia. Echo in 2018 showed severe LVH and no recurrence of myxoma. He has a history of atrial flutter with cardioversion in 2012 with Dr. Wynonia Lawman  Overall he is doing well and presents for routine follow up. He notes surgical site discomfort from left atrial myxoma resection but no chest pain or syncope.   He previously had dyspnea on exertion but took lasix and this improved.   He is working on his diabetes control with his PCP, control has been suboptimal lately.   His most bothersome symptom at this time is pain behind the left knee, mostly with trying to flex the knee. No claudication, no rest pain.    Past Medical History:  Diagnosis Date  . Atrial flutter ([place])   . Atrial myxoma    Left  . Benign neoplasm of heart 08/07/2011  . Chronic right shoulder pain 02/14/2018  . Chronic right-sided low back pain with right-sided sciatica 02/14/2018  . Diabetes mellitus type II   . DM2 (diabetes mellitus, type 2) ([place])   . HTN (hypertension)   . Hyperlipidemia   . Renal insufficiency   . Unspecified pleural effusion 08/07/2011    Past Surgical History:  Procedure Laterality Date  . CARDIAC CATHETERIZATION    . cardioversion of atrial flutter.  07/31/11  . Right miniature thoracotomy for resection of left atrial myxoma.  06/09/11   Dr Roxy Manns    Current Medications: Current Meds  Medication Sig  . amLODipine (NORVASC) 10 MG tablet Take 10 mg by mouth daily.    Marland Kitchen aspirin 81 MG tablet Take 81 mg by mouth  daily.    Marland Kitchen aspirin-acetaminophen-caffeine (EXCEDRIN MIGRAINE) 250-250-65 MG per tablet Take 1 tablet by mouth every 6 (six) hours as needed.    . benazepril (LOTENSIN) 40 MG tablet Take 40 mg by mouth daily.    Marland Kitchen BYSTOLIC 10 MG tablet Take 2 tablets by mouth daily. **Need office visit for further refills.**  . canagliflozin (INVOKANA) 300 MG TABS tablet Take 300 mg by mouth daily before breakfast.  . desoximetasone (TOPICORT) 0.05 % cream Apply topically 2 (two) times daily.    . diphenhydrAMINE (BENADRYL) 25 MG tablet Take 25 mg by mouth at bedtime as needed.    Marland Kitchen esomeprazole (NEXIUM) 40 MG capsule Take 40 mg by mouth daily before breakfast.    . furosemide (LASIX) 40 MG tablet Take 40 mg by mouth 2 (two) times daily.   Marland Kitchen guaiFENesin (MUCINEX) 600 MG 12 hr tablet Take 1,200 mg by mouth as needed.    Marland Kitchen HUMALOG KWIKPEN 200 UNIT/ML SOPN Inject 10 Units as directed 2 (two) times daily.  . Insulin Glargine (BASAGLAR KWIKPEN) 100 UNIT/ML SOPN Inject 60 Units into the skin 2 (two) times daily.  Marland Kitchen latanoprost (XALATAN) 0.005 % ophthalmic solution Place 1 drop into both eyes at bedtime.  . minoxidil (LONITEN) 10 MG tablet Take 1 tablet by mouth twice daily. **Please schedule appointment for further refills.**  .  rosuvastatin (CRESTOR) 10 MG tablet Take 10 mg by mouth daily.  . sitaGLIPtin (JANUVIA) 100 MG tablet Take 100 mg by mouth daily.    . tadalafil (CIALIS) 10 MG tablet Take 10 mg by mouth daily as needed.       Allergies:   Patient has no known allergies.   Social History   Socioeconomic History  . Marital status: Married    Spouse name: Not on file  . Number of children: Not on file  . Years of education: Not on file  . Highest education level: Not on file  Occupational History  . Occupation: IT sales professional: SELF-EMPLOYED  Tobacco Use  . Smoking status: Former Smoker    Types: Cigarettes    Quit date: 11/20/2006    Years since quitting: 13.4  . Smokeless tobacco: Never Used    Substance and Sexual Activity  . Alcohol use: No  . Drug use: No  . Sexual activity: Not on file  Other Topics Concern  . Not on file  Social History Narrative  . Not on file   Social Determinants of Health   Financial Resource Strain:   . Difficulty of Paying Living Expenses:   Food Insecurity:   . Worried About Charity fundraiser in the Last Year:   . Arboriculturist in the Last Year:   Transportation Needs:   . Film/video editor (Medical):   Marland Kitchen Lack of Transportation (Non-Medical):   Physical Activity:   . Days of Exercise per Week:   . Minutes of Exercise per Session:   Stress:   . Feeling of Stress :   Social Connections:   . Frequency of Communication with Friends and Family:   . Frequency of Social Gatherings with Friends and Family:   . Attends Religious Services:   . Active Member of Clubs or Organizations:   . Attends Archivist Meetings:   Marland Kitchen Marital Status:      Family History: The patient's family history includes CAD in his father.  ROS:   Please see the history of present illness.    All other systems reviewed and are negative.  EKGs/Labs/Other Studies Reviewed:    The following studies were reviewed today:  EKG: Sinus rhythm with PACs, LVH, IVCD ST-T wave abnormalities inferolateral, may represent repolarization change.  Recent Labs: No results found for requested labs within last 8760 hours.  Recent Lipid Panel No results found for: CHOL, TRIG, HDL, CHOLHDL, VLDL, LDLCALC, LDLDIRECT  Physical Exam:    VS:  BP 138/78   Pulse 73   Temp 97.6 F (36.4 C)   Ht 5' 11.5" (1.816 m)   Wt 246 lb (111.6 kg)   SpO2 93%   BMI 33.83 kg/m     Wt Readings from Last 5 Encounters:  03/18/20 246 lb (111.6 kg)  03/19/19 255 lb (115.7 kg)  09/19/18 251 lb 0.6 oz (113.9 kg)  11/06/11 226 lb (102.5 kg)  08/07/11 214 lb (97.1 kg)     Constitutional: No acute distress Eyes: sclera non-icteric, normal conjunctiva and lids ENMT: normal  dentition, moist mucous membranes Cardiovascular: regular rhythm, normal rate, no murmurs. S1 and S2 normal. Radial pulses normal bilaterally. No jugular venous distention.  Respiratory: clear to auscultation bilaterally GI : normal bowel sounds, soft and nontender. No distention.   MSK: extremities warm, well perfused. No edema.  NEURO: grossly nonfocal exam, moves all extremities. PSYCH: alert and oriented x 3, normal mood and affect.  ASSESSMENT:    1. Left Atrial myxoma   2. Essential hypertension   3. Pain of left lower extremity   4. Hyperlipidemia, unspecified hyperlipidemia type    PLAN:    Left Atrial myxoma - Plan: EKG 12-Lead, ECHOCARDIOGRAM COMPLETE  - Will repeat an echo since it has been a few years to ensure no recurrence of myxoma.   Essential hypertension - Plan: EKG 12-Lead, ECHOCARDIOGRAM COMPLETE - continue amlodipine, benazepril, nebivolol, minoxidil, furosemide  DOE -  Continue furosemide  HLD - continue crestor  Afib requiring cardioversion - thorough chart review, unclear why not on anticoagulation though likely due to lack of recurrence. This patients CHA2DS2-VASc Score and unadjusted Ischemic Stroke Rate (% per year) is equal to 2.2 % stroke rate/year from a score of 2 Above score calculated as 1 point each if present [CHF, HTN, DM, Vascular=MI/PAD/Aortic Plaque, Age if 65-74, or Male] Above score calculated as 2 points each if present [Age > 75, or Stroke/TIA/TE]   Pain of left lower extremity - Plan: VAS [place] LOWER EXTREMITY VENOUS (DVT), CANCELED: VAS [place] LOWER EXTREMITY ARTERIAL DUPLEX  Will obtain lower extremity ultraound to evaluate for vascular disease or Baker's cyst.  Total time of encounter: 30 minutes total time of encounter, including 25 minutes spent in face-to-face patient care on the date of this encounter. This time includes coordination of care and counseling regarding above mentioned problem list. Remainder of non-face-to-face time  involved reviewing chart documents/testing relevant to the patient encounter and documentation in the medical record. I have independently reviewed documentation from referring provider.   Cherlynn Kaiser, MD [place]  [facility]    Medication Adjustments/Labs and Tests Ordered: Current medicines are reviewed at length with the patient today.  Concerns regarding medicines are outlined above.  Orders Placed This Encounter  Procedures  . EKG 12-Lead  . ECHOCARDIOGRAM COMPLETE  . VAS [place] LOWER EXTREMITY VENOUS (DVT)   No orders of the defined types were placed in this encounter.   Patient Instructions  Medication Instructions:  NONE. *If you need a refill on your cardiac medications before your next appointment, please call your pharmacy*   Lab Work: NONE.  Testing/Procedures: WILL BE SCHEDULED AT [place] [address]. Your physician has requested that you have an echocardiogram. Echocardiography is a painless test that uses sound waves to create images of your heart. It provides your doctor with information about the size and shape of your heart and how well your heart's chambers and valves are working. This procedure takes approximately one hour. There are no restrictions for this procedure.   WILL BE SCHEDULED AT [place] [address]. Your physician has requested that you have a lower extremity arterial duplex. This test is an ultrasound of the arteries in the leg. It looks at arterial blood flow in the leg. Allow one hour for Lower Arterial scans. There are no restrictions or special instructions    Follow-Up: At [facility], you and your health needs are our priority.  As part of our continuing mission to provide you with exceptional heart care, we have created designated Provider Care Teams.  These Care Teams include your primary Cardiologist (physician) and Advanced Practice Providers (APPs -  Physician Assistants and Nurse Practitioners) who all  work together to provide you with the care you need, when you need it.  We recommend signing up for the patient portal called "MyChart".  Sign up information is provided on this After Visit Summary.  MyChart  is used to connect with patients for Virtual Visits (Telemedicine).  Patients are able to view lab/test results, encounter notes, upcoming appointments, etc.  Non-urgent messages can be sent to your provider as well.   To learn more about what you can do with MyChart, go to [email or web address].    Your next appointment:   3 month(s)  The format for your next appointment:   In Person  Provider:   Cherlynn Kaiser, MD

## 2020-04-07 ENCOUNTER — Other Ambulatory Visit: Payer: Self-pay

## 2020-04-07 ENCOUNTER — Ambulatory Visit (HOSPITAL_COMMUNITY): Payer: 59 | Attending: Cardiovascular Disease

## 2020-04-07 DIAGNOSIS — D151 Benign neoplasm of heart: Secondary | ICD-10-CM | POA: Diagnosis present

## 2020-04-07 DIAGNOSIS — I10 Essential (primary) hypertension: Secondary | ICD-10-CM | POA: Diagnosis present

## 2020-04-08 ENCOUNTER — Ambulatory Visit (HOSPITAL_COMMUNITY)
Admission: RE | Admit: 2020-04-08 | Discharge: 2020-04-08 | Disposition: A | Discharge status: Home / Self Care | Payer: 59 | Source: Ambulatory Visit | Attending: Cardiology | Admitting: Cardiology

## 2020-04-08 ENCOUNTER — Other Ambulatory Visit: Payer: Self-pay

## 2020-04-08 DIAGNOSIS — M79605 Pain in left leg: Secondary | ICD-10-CM

## 2020-04-28 ENCOUNTER — Encounter: Payer: Self-pay | Admitting: Orthopaedic Surgery

## 2020-04-28 ENCOUNTER — Ambulatory Visit: Payer: Self-pay

## 2020-04-28 ENCOUNTER — Ambulatory Visit: Payer: 59 | Admitting: Orthopedic Surgery

## 2020-04-28 ENCOUNTER — Ambulatory Visit: Payer: 59 | Admitting: Orthopaedic Surgery

## 2020-04-28 VITALS — Ht 71.5 in | Wt 250.0 lb

## 2020-04-28 DIAGNOSIS — G8929 Other chronic pain: Secondary | ICD-10-CM | POA: Diagnosis not present

## 2020-04-28 DIAGNOSIS — M25562 Pain in left knee: Secondary | ICD-10-CM

## 2020-04-28 NOTE — Progress Notes (Signed)
Office Visit Note   Patient: Richard Barton           Date of Birth: [birth year]           MRN: [id number] Visit Date: 04/28/2020              Requested by: Richard Pao, MD [address] [place],  [place] [address] PCP: Richard Pao, MD   Assessment & Plan: Visit Diagnoses:  1. Chronic pain of left knee     Plan: Impression is incidental finding of a left Baker's cyst.  I discussed with him that in order to further evaluate the cyst we would need to obtain an MRI but given the lack of symptoms and the appearance on ultrasound are relatively certain that it is a routine Baker's cyst.  He agrees that he really does not want to work this up any further as this is really asymptomatic.  It has not grown in size.  He denies any constitutional symptoms.  For now we will just monitor closely.  Follow-up as needed.  Follow-Up Instructions: Return in about 3 weeks (around 05/19/2020).   Orders:  Orders Placed This Encounter  Procedures  . XR KNEE 3 VIEW LEFT   No orders of the defined types were placed in this encounter.     Procedures: No procedures performed   Clinical Data: No additional findings.   Subjective: Chief Complaint  Patient presents with  . Left Knee - Pain    Richard Barton is a 64 year old gentleman that comes in for evaluation of an incidental finding of a left popliteal cyst that was found on an ultrasound to rule out a DVT due to left lower extremity swelling.  He denies any constant pain.  The only real complaint is that he has trouble flexing his knee to put on his socks.  Otherwise he has no issues.   Review of Systems  Constitutional: Negative.   All other systems reviewed and are negative.    Objective: Vital Signs: Ht 5' 11.5" (1.816 m)   Wt 250 lb (113.4 kg)   BMI 34.38 kg/m   Physical Exam Vitals and nursing note reviewed.  Constitutional:      Appearance: He is well-developed.  Pulmonary:     Effort: Pulmonary effort is normal.    Abdominal:     Palpations: Abdomen is soft.  Skin:    General: Skin is warm.  Neurological:     Mental Status: He is alert and oriented to person, place, and time.  Psychiatric:        Behavior: Behavior normal.        Thought Content: Thought content normal.        Judgment: Judgment normal.     Ortho Exam Left knee shows full range of motion.  No joint effusion.  I cannot palpate the mass that corresponds to the ultrasound.  No neurovascular compromise distally. Specialty Comments:  No specialty comments available.  Imaging: XR KNEE 3 VIEW LEFT  Result Date: 04/28/2020 Mild osteoarthritis without any significant degenerative changes    PMFS History: Patient Active Problem List   Diagnosis Date Noted  . Abnormal EKG 09/19/2018  . Chronic right shoulder pain 02/14/2018  . Chronic right-sided low back pain with right-sided sciatica 02/14/2018  . Benign neoplasm of heart 08/07/2011  . Unspecified pleural effusion 08/07/2011  . Left Atrial myxoma   . Hypertensive heart and chronic kidney disease   . DM2 (diabetes mellitus, type 2) ([place])   .  Renal insufficiency    Past Medical History:  Diagnosis Date  . Atrial flutter ([place])   . Atrial myxoma    Left  . Benign neoplasm of heart 08/07/2011  . Chronic right shoulder pain 02/14/2018  . Chronic right-sided low back pain with right-sided sciatica 02/14/2018  . Diabetes mellitus type II   . DM2 (diabetes mellitus, type 2) ([place])   . HTN (hypertension)   . Hyperlipidemia   . Renal insufficiency   . Unspecified pleural effusion 08/07/2011    Family History  Problem Relation Age of Onset  . CAD Father     Past Surgical History:  Procedure Laterality Date  . CARDIAC CATHETERIZATION    . cardioversion of atrial flutter.  07/31/11  . Right miniature thoracotomy for resection of left atrial myxoma.  06/09/11   Dr Richard Barton   Social History   Occupational History  . Occupation: IT sales professional: SELF-EMPLOYED  Tobacco Use   . Smoking status: Former Smoker    Types: Cigarettes    Quit date: 11/20/2006    Years since quitting: 13.4  . Smokeless tobacco: Never Used  Substance and Sexual Activity  . Alcohol use: No  . Drug use: No  . Sexual activity: Not on file

## 2020-07-05 ENCOUNTER — Ambulatory Visit: Payer: 59 | Admitting: Internal Medicine

## 2020-07-05 NOTE — Progress Notes (Deleted)
Cardiology Office Note:    Date:  07/05/2020   ID:  Richard Barton, DOB 1956-03-25, MRN [id number]  PCP:  Haywood Pao, MD  Cardiologist:  Elouise Munroe, MD  Electrophysiologist:  None   Referring MD: Haywood Pao, MD   Chief Complaint: follow up LA myxoma resection, HTN  History of Present Illness:    Richard Barton is a 64 y.o. male with a history of history of LA myxoma with resection 2012, hypertension, HLD with severe LVH and CKD last seen  03/19/2019 by my partner Dr. Bettina Gavia. Echo in 2018 showed severe LVH and no recurrence of myxoma. He has a history of atrial flutter with cardioversion in 2012 with Dr. Wynonia Lawman.  No recurrence of myxoma on echo.   Grade 2 diastolic dysfunction.   Past Medical History:  Diagnosis Date  . Atrial flutter ([place])   . Atrial myxoma    Left  . Benign neoplasm of heart 08/07/2011  . Chronic right shoulder pain 02/14/2018  . Chronic right-sided low back pain with right-sided sciatica 02/14/2018  . Diabetes mellitus type II   . DM2 (diabetes mellitus, type 2) ([place])   . HTN (hypertension)   . Hyperlipidemia   . Renal insufficiency   . Unspecified pleural effusion 08/07/2011    Past Surgical History:  Procedure Laterality Date  . CARDIAC CATHETERIZATION    . cardioversion of atrial flutter.  07/31/11  . Right miniature thoracotomy for resection of left atrial myxoma.  06/09/11   Dr Roxy Manns    Current Medications: No outpatient medications have been marked as taking for the 07/05/20 encounter (Appointment) with Elouise Munroe, MD.     Allergies:   Patient has no known allergies.   Social History   Socioeconomic History  . Marital status: Married    Spouse name: Not on file  . Number of children: Not on file  . Years of education: Not on file  . Highest education level: Not on file  Occupational History  . Occupation: IT sales professional: SELF-EMPLOYED  Tobacco Use  . Smoking status: Former Smoker    Types: Cigarettes     Quit date: 11/20/2006    Years since quitting: 13.6  . Smokeless tobacco: Never Used  Vaping Use  . Vaping Use: Never used  Substance and Sexual Activity  . Alcohol use: No  . Drug use: No  . Sexual activity: Not on file  Other Topics Concern  . Not on file  Social History Narrative  . Not on file   Social Determinants of Health   Financial Resource Strain:   . Difficulty of Paying Living Expenses:   Food Insecurity:   . Worried About Charity fundraiser in the Last Year:   . Arboriculturist in the Last Year:   Transportation Needs:   . Film/video editor (Medical):   Marland Kitchen Lack of Transportation (Non-Medical):   Physical Activity:   . Days of Exercise per Week:   . Minutes of Exercise per Session:   Stress:   . Feeling of Stress :   Social Connections:   . Frequency of Communication with Friends and Family:   . Frequency of Social Gatherings with Friends and Family:   . Attends Religious Services:   . Active Member of Clubs or Organizations:   . Attends Archivist Meetings:   Marland Kitchen Marital Status:      Family History: The patient's family history includes CAD in his father.  ROS:   Please see the history of present illness.    All other systems reviewed and are negative.  EKGs/Labs/Other Studies Reviewed:    The following studies were reviewed today:  EKG:  ***  I have independently reviewed the images from ***.  Recent Labs: No results found for requested labs within last 8760 hours.  Recent Lipid Panel No results found for: CHOL, TRIG, HDL, CHOLHDL, VLDL, LDLCALC, LDLDIRECT  Physical Exam:    VS:  There were no vitals taken for this visit.    Wt Readings from Last 5 Encounters:  04/28/20 250 lb (113.4 kg)  03/18/20 246 lb (111.6 kg)  03/19/19 255 lb (115.7 kg)  09/19/18 251 lb 0.6 oz (113.9 kg)  11/06/11 226 lb (102.5 kg)     Constitutional: No acute distress Eyes: sclera non-icteric, normal conjunctiva and lids ENMT: normal dentition,  moist mucous membranes Cardiovascular: regular rhythm, normal rate, no murmurs. S1 and S2 normal. Radial pulses normal bilaterally. No jugular venous distention.  Respiratory: clear to auscultation bilaterally GI : normal bowel sounds, soft and nontender. No distention.   MSK: extremities warm, well perfused. No edema.  NEURO: grossly nonfocal exam, moves all extremities. PSYCH: alert and oriented x 3, normal mood and affect.   ASSESSMENT:    No diagnosis found. PLAN:    No diagnosis found.  Total time of encounter: *** minutes total time of encounter, including *** minutes spent in face-to-face patient care on the date of this encounter. This time includes coordination of care and counseling regarding above mentioned problem list. Remainder of non-face-to-face time involved reviewing chart documents/testing relevant to the patient encounter and documentation in the medical record. I have independently reviewed documentation from referring provider.   Cherlynn Kaiser, MD [place]  [facility]    Medication Adjustments/Labs and Tests Ordered: Current medicines are reviewed at length with the patient today.  Concerns regarding medicines are outlined above.  No orders of the defined types were placed in this encounter.  No orders of the defined types were placed in this encounter.   There are no Patient Instructions on file for this visit.

## 2020-09-04 ENCOUNTER — Ambulatory Visit: Payer: 59 | Attending: Internal Medicine

## 2020-09-04 DIAGNOSIS — Z23 Encounter for immunization: Secondary | ICD-10-CM

## 2020-09-04 NOTE — Progress Notes (Signed)
   Covid-19 Vaccination Clinic  Name:  Richard Barton    MRN: [id number] DOB: [birth year]  09/04/2020  [name] was observed post Covid-19 immunization for 15 minutes without incident. He was provided with Vaccine Information Sheet and instruction to access the V-Safe system.   [name] was instructed to call 911 with any severe reactions post vaccine: Marland Kitchen Difficulty breathing  . Swelling of face and throat  . A fast heartbeat  . A bad rash all over body  . Dizziness and weakness

## 2021-07-03 NOTE — Progress Notes (Signed)
Cardiology Clinic Note   Patient Name: Richard Barton Date of Encounter: 07/04/2021  Primary Care Provider:  Haywood Pao, MD Primary Cardiologist:  Elouise Munroe, MD  Patient Profile    Richard Barton 65 year old male presents to the clinic today for follow-up evaluation of his essential hypertension, HLD, and left atrial myxoma.  Past Medical History    Past Medical History:  Diagnosis Date   Atrial flutter ([place])    Atrial myxoma    Left   Benign neoplasm of heart 08/07/2011   Chronic right shoulder pain 02/14/2018   Chronic right-sided low back pain with right-sided sciatica 02/14/2018   Diabetes mellitus type II    DM2 (diabetes mellitus, type 2) ([facility])    HTN (hypertension)    Hyperlipidemia    Renal insufficiency    Unspecified pleural effusion 08/07/2011   Past Surgical History:  Procedure Laterality Date   CARDIAC CATHETERIZATION     cardioversion of atrial flutter.  07/31/11   Right miniature thoracotomy for resection of left atrial myxoma.  06/09/11   Dr Roxy Manns    Allergies  No Known Allergies  History of Present Illness    Richard Barton has a PMH of left atrial myxoma with resection in 2012, HTN, HLD, severe LVH, and CKD.  His echocardiogram 2018 showed severe LVH with no recurrence of his LA myxoma.  His PMH also includes atrial flutter with cardioversion 2012.  He was last seen by [facility] 03/18/2020.  During that time he was doing well.  He did report some surgical site discomfort from his LAD myxoma resection.  He denied chest pain and syncope.  He denied dyspnea on exertion.  He was working on his DM management with his PCP.  His main complaint was pain behind his left knee with flexion.  He denied claudication and pain at rest.  He presents the clinic today for follow-up evaluation states he has been with increased shortness of breath for the last month.  He is cardiac unaware.  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  His blood pressure today is elevated at 148/82.  He reports that he has some dietary indiscretion and enjoys eating pork.  I have encouraged him to remove salt from his diet and continue his physical activity.  We will give him apixaban samples, have him avoid triggers for atrial fibrillation, and follow-up as scheduled.  Today he denies chest pain, fatigue, palpitations, melena, hematuria, hemoptysis, diaphoresis, weakness, presyncope, syncope, orthopnea, and PND.   Home Medications    Prior to Admission medications   Medication Sig Start Date End Date Taking? Authorizing Provider  amLODipine (NORVASC) 10 MG tablet Take 10 mg by mouth daily.      [provider]  aspirin 81 MG tablet Take 81 mg by mouth daily.      [provider]  aspirin-acetaminophen-caffeine (EXCEDRIN MIGRAINE) [phone] MG per tablet Take 1 tablet by mouth every 6 (six) hours as needed.      [provider]  benazepril (LOTENSIN) 40 MG tablet Take 40 mg by mouth daily.      [provider]  BYSTOLIC 10 MG tablet Take 2 tablets by mouth daily. **Need office visit for further refills.** 01/06/20   Richardo Priest, MD  canagliflozin (INVOKANA) 300 MG TABS tablet Take 300 mg by mouth daily  before breakfast.    [provider]  desoximetasone (TOPICORT) 0.05 % cream Apply topically 2 (two) times daily.      [provider]  diphenhydrAMINE (BENADRYL) 25 MG tablet Take 25 mg by mouth at bedtime as needed.      [provider]  esomeprazole (NEXIUM) 40 MG capsule Take 40 mg by mouth daily before breakfast.      [provider]  furosemide (LASIX) 40 MG tablet Take 40 mg by mouth 2 (two) times daily.     [provider]  guaiFENesin (MUCINEX) 600 MG 12 hr tablet Take 1,200 mg by mouth as needed.      [provider]  HUMALOG KWIKPEN 200  UNIT/ML SOPN Inject 10 Units as directed 2 (two) times daily. 09/08/18   [provider]  Insulin Glargine (BASAGLAR KWIKPEN) 100 UNIT/ML SOPN Inject 60 Units into the skin 2 (two) times daily.    [provider]  latanoprost (XALATAN) 0.005 % ophthalmic solution Place 1 drop into both eyes at bedtime.    [provider]  minoxidil (LONITEN) 10 MG tablet Take 1 tablet by mouth twice daily. **Please schedule appointment for further refills.** 01/06/20   Richardo Priest, MD  rosuvastatin (CRESTOR) 10 MG tablet Take 10 mg by mouth daily.    [provider]  sitaGLIPtin (JANUVIA) 100 MG tablet Take 100 mg by mouth daily.      [provider]  tadalafil (CIALIS) 10 MG tablet Take 10 mg by mouth daily as needed.      [provider]    Family History    Family History  Problem Relation Age of Onset   CAD Father    He indicated that his mother is alive. He indicated that his father is deceased. He indicated that both of his sisters are alive. He indicated that both of his brothers are alive.  Social History    Social History   Socioeconomic History   Marital status: Married    Spouse name: Not on file   Number of children: Not on file   Years of education: Not on file   Highest education level: Not on file  Occupational History   Occupation: IT sales professional: SELF-EMPLOYED  Tobacco Use   Smoking status: Former    Types: Cigarettes    Quit date: 11/20/2006    Years since quitting: 14.6   Smokeless tobacco: Never  Vaping Use   Vaping Use: Never used  Substance and Sexual Activity   Alcohol use: No   Drug use: No   Sexual activity: Not on file  Other Topics Concern   Not on file  Social History Narrative   Not on file   Social Determinants of Health   Financial Resource Strain: Not on file  Food Insecurity: Not on file  Transportation Needs: Not on file  Physical Activity: Not on file  Stress: Not on file  Social  Connections: Not on file  Intimate Partner Violence: Not on file     Review of Systems    General:  No chills, fever, night sweats or weight changes.  Cardiovascular:  No chest pain, dyspnea on exertion, edema, orthopnea, palpitations, paroxysmal nocturnal dyspnea. Dermatological: No rash, lesions/masses Respiratory: No cough, dyspnea Urologic: No hematuria, dysuria Abdominal:   No nausea, vomiting, diarrhea, bright red blood per rectum, melena, or hematemesis Neurologic:  No visual changes, wkns, changes in mental status. All other systems reviewed and are otherwise negative except as  noted above.  Physical Exam    VS:  BP (!) 148/82   Pulse 67   Ht '5\' 11"'$  (1.803 m)   Wt 258 lb 9.6 oz (117.3 kg)   SpO2 94%   BMI 36.07 kg/m  , BMI Body mass index is 36.07 kg/m. GEN: Well nourished, well developed, in no acute distress. HEENT: normal. Neck: Supple, no JVD, carotid bruits, or masses. Cardiac: Atrial flutter 67 bpm, no murmurs, rubs, or gallops. No clubbing, cyanosis, edema.  Radials/DP/PT 2+ and equal bilaterally.  Respiratory:  Respirations regular and unlabored, clear to auscultation bilaterally. GI: Soft, nontender, nondistended, BS + x 4. MS: no deformity or atrophy. Skin: warm and dry, no rash. Neuro:  Strength and sensation are intact. Psych: Normal affect.  Accessory Clinical Findings    Recent Labs: No results found for requested labs within last 8760 hours.   Recent Lipid Panel No results found for: CHOL, TRIG, HDL, CHOLHDL, VLDL, LDLCALC, LDLDIRECT  ECG personally reviewed by me today- atrial flutter with variable AV block ST and T wave abnormality 67 bpm - No acute changes  Echocardiogram 04/07/2020 IMPRESSIONS     1. Left ventricular ejection fraction, by estimation, is 50 to 55%. The  left ventricle has low normal function. The left ventricle demonstrates  regional wall motion abnormalities (see scoring diagram/findings for  description). Left  ventricular diastolic   parameters are consistent with Grade II diastolic dysfunction  (pseudonormalization).   2. Right ventricular systolic function is normal. The right ventricular  size is mildly enlarged. There is normal pulmonary artery systolic  pressure.   3. Left atrial size was mildly dilated.   4. The mitral valve is normal in structure. Trivial mitral valve  regurgitation. No evidence of mitral stenosis.   5. The aortic valve is tricuspid. Aortic valve regurgitation is not  visualized. No aortic stenosis is present.   6. The inferior vena cava is dilated in size with <50% respiratory  variability, suggesting right atrial pressure of 15 mmHg.  Assessment & Plan   1.  Atrial fibrillation/flutter-EKG today shows atrial flutter with variable AV block ST and T wave abnormality 67 bpm.  Previously required DCCV in 2012 which was successful.  CHA2DS2-VASc score of 2.  Reviewed stroke risk. Continue Bystolic, aspirin Avoid triggers caffeine, chocolate, EtOH, dehydration, etc. Start apixaban 5 mg twice daily-samples given Discussed scheduling DCCV if he continues to be in atrial flutter during follow-up appointment.  Essential hypertension-BP today 148/82.  Similar control at home.  He reports dietary indiscretion.  We discussed the importance of low-salt diet. Continue benazepril, furosemide, amlodipine, nebivolol Increase minoxidil to 20 mg twice daily Heart healthy low-sodium diet-salty 6 given Increase physical activity as tolerated Maintain blood pressure log and bring to subsequent appointments  Left atrial myxoma-continues to have intermittent periods of sharp right-sided chest discomfort related to incisional site.  Patient underwent resection of LAA myxoma in 2012.  Follow-up echocardiogram in 2018 showed no recurrence. Continue to monitor  Hyperlipidemia-LDL 48 on 4/22/22reports compliance with rosuvastatin.  Denies myalgia type pain. Follows with PCP  Left lower  extremity pain-resolved.  Lower extremity venous ultrasound showed a well-defined avascular complex lesion in the posterior portion of the left knee.  No evidence of lower extremity DVT bilaterally.  Recommended follow-up with PCP.  Disposition: Follow-up with Caron Presume, PA-C as scheduled  Jossie Ng. Adrielle Polakowski NP-C    07/04/2021, 4:40 PM [facility] [address] Office [phone] Fax [phone]  Notice:  This dictation was prepared with Dragon dictation along with smaller phrase technology. Any transcriptional errors that result from this process are unintentional and may not be corrected upon review.  I spent 14 minutes examining this patient, reviewing medications, and using patient centered shared decision making involving her cardiac care.  Prior to her visit I spent greater than 20 minutes reviewing her past medical history,  medications, and prior cardiac tests.

## 2021-07-04 ENCOUNTER — Ambulatory Visit: Payer: 59 | Admitting: General Practice

## 2021-07-04 ENCOUNTER — Other Ambulatory Visit: Payer: Self-pay

## 2021-07-04 ENCOUNTER — Encounter: Payer: Self-pay | Admitting: General Practice

## 2021-07-04 VITALS — BP 148/82 | HR 67 | Ht 71.0 in | Wt 258.6 lb

## 2021-07-04 DIAGNOSIS — I10 Essential (primary) hypertension: Secondary | ICD-10-CM | POA: Diagnosis not present

## 2021-07-04 DIAGNOSIS — I4819 Other persistent atrial fibrillation: Secondary | ICD-10-CM

## 2021-07-04 DIAGNOSIS — D151 Benign neoplasm of heart: Secondary | ICD-10-CM | POA: Diagnosis not present

## 2021-07-04 DIAGNOSIS — E785 Hyperlipidemia, unspecified: Secondary | ICD-10-CM

## 2021-07-04 DIAGNOSIS — M79605 Pain in left leg: Secondary | ICD-10-CM

## 2021-07-04 MED ORDER — APIXABAN 5 MG PO TABS: 5.0000 mg | ORAL_TABLET | Freq: Two times a day (BID) | ORAL | 6 refills | Status: DC

## 2021-07-04 MED ORDER — APIXABAN 5 MG PO TABS
5.0000 mg | ORAL_TABLET | Freq: Two times a day (BID) | ORAL | 6 refills | Status: DC
Start: 2021-07-04 — End: 2021-07-04

## 2021-07-04 MED ORDER — MINOXIDIL 10 MG PO TABS: 20.0000 mg | ORAL_TABLET | Freq: Two times a day (BID) | ORAL | 6 refills | Status: DC

## 2021-07-04 NOTE — Patient Instructions (Signed)
Medication Instructions:  Increase minodixil '20mg'$  twice daily   TAKE APIXIBAN '5MG'$  TWICE DAILY *If you need a refill on your cardiac medications before your next appointment, please call your pharmacy*  Lab Work:   Testing/Procedures:  NONE    NONE  Special Instructions PLEASE READ AND FOLLOW SALTY 6-ATTACHED-1,'800mg'$  daily  PLEASE MAINTAIN PHYSICAL ACTIVITY AS TOLERATED  TAKE AND LOG YOUR BLOOD PRESSURE DAILY.  Follow-Up: Your next appointment:  KEEP SCHEDULED APPOINTMENT  In Person with Caron Presume, PA-C   At [facility], you and your health needs are our priority.  As part of our continuing mission to provide you with exceptional heart care, we have created designated Provider Care Teams.  These Care Teams include your primary Cardiologist (physician) and Advanced Practice Providers (APPs -  Physician Assistants and Nurse Practitioners) who all work together to provide you with the care you need, when you need it.  We recommend signing up for the patient portal called "MyChart".  Sign up information is provided on this After Visit Summary.  MyChart is used to connect with patients for Virtual Visits (Telemedicine).  Patients are able to view lab/test results, encounter notes, upcoming appointments, etc.  Non-urgent messages can be sent to your provider as well.   To learn more about what you can do with MyChart, go to [email or web address].              6 SALTY THINGS TO AVOID     1,'800MG'$  DAILY

## 2021-07-04 NOTE — Addendum Note (Signed)
Addended by: Waylan Rocher on: 07/04/2021 05:04 PM   Modules accepted: Orders

## 2021-07-11 ENCOUNTER — Telehealth: Payer: Self-pay | Admitting: Internal Medicine

## 2021-07-11 NOTE — Telephone Encounter (Signed)
Pt c/o medication issue:  1. Name of Medication:   minoxidil (LONITEN) 10 MG tablet apixaban (ELIQUIS) 5 MG TABS tablet  2. How are you currently taking this medication (dosage and times per day)? Needs clarification  3. Are you having a reaction (difficulty breathing--STAT)? no  4. What is your medication issue? Delilah Shan from [employer] to clarify the patient's medications. She says the patient is on aspirin therapy so they want to know if eliquis is being stopped. She also states the patient got another prescription for 1 tablet twice a day of the Minoxidl from another provider. Phone: [phone]

## 2021-07-11 NOTE — Telephone Encounter (Signed)
Left message for pharmacist to call back.

## 2021-07-11 NOTE — Telephone Encounter (Signed)
Spoke with the pharmacist and verified that the patient's minoxidil was increased to 20 mg twice per day. Advised that patient should also start taking Eliquis 5 mg twice daily.  Pharmacist would like to know if the patient is to continue ASA daily or if that is being discontinued.

## 2021-07-11 NOTE — Telephone Encounter (Signed)
[employer] is returning a call

## 2021-07-12 NOTE — Telephone Encounter (Signed)
[facility] following up ... please advise

## 2021-07-22 NOTE — Telephone Encounter (Signed)
Spoke with the pharmacist and advised that patient should be taking both Eliquis and ASA. Pharmacist verbalized understanding and states that they will make a note on the patient's chart.

## 2021-07-27 NOTE — Progress Notes (Signed)
Cardiology Office Note:    Date:  07/28/2021   ID:  Richard Barton, DOB August 21, 1956, MRN [id number]  PCP:  Haywood Pao, MD Referring MD: Haywood Pao, MD    Richard Barton  Cardiologist:  Elouise Munroe, MD   Reason for visit: Discuss cardioversion/sleep study  History of Present Illness:    Richard Barton is a 65 y.o. male with a hx of left atrial myxoma with resection in 2012, A-flutter with cardioversion 2012, HTN, HLD, severe LVH, DM and CKD.  His echocardiogram 2018 showed severe LVH with no recurrence of his LA myxoma.   He saw Coletta Memos in clinic on July 04, 2021 and noticed increased shortness of breath x1 month.  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  Patient noticed dietary indiscretion.  EKG revealed atrial flutter with rate of 67 bpm.  He was started on Eliquis 5 mg twice daily.  He is scheduled today to reassess his rhythm and discuss possible cardioversion.  Today he comes in with significant dyspnea on exertion.  He has constant central chest tightness with associated tired shoulders, heartburn and persistent fluid retention.  He states he remembers having shortness of breath with his last A-flutter episode 10 years ago.  Since then he has did decreased his caffeine intake.  Denies palpitations.  He has had no bleeding with new start Eliquis.  He states that shortness of breath is so significant it strains him to walk to the car garage or move from one room to the next.  He states he saw his primary care 1 week ago with shortness of breath and was noted to have a 20 pound weight gain.  They increased his Lasix from once a day to 3 times a day and eventually to twice a day.  He thinks he lost weight but is still approximately 8 pounds up.  He has residual lower leg swelling and abdominal distention.  He mentions recent tick bites over the last 2 weeks and recently been started on antibiotic.  He works as  a Psychologist, sport and exercise and also takes care of his wife with multiple sclerosis.  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  He is sleeping in a recliner secondary to shortness of breath.  Positive PND.  He had apneic episodes starting before this A-flutter diagnosis.  Patient has not had a sleep study in the past.  Denies dizziness and syncope.  He was noted to have a blood pressure of 179/98 on August 19.  He discovered he was out of minoxidil in his pill dispenser.  After restarting, his blood pressure is now 120s to 140s over 70s.  He thinks last night he became dehydrated with blood pressure 96/47.  He thinks he likely had a coronary evaluation before his left atrial myxoma resection in 2012. He has diabetes, obesity, family history of heart disease with his father having MI young and a history of tobacco use.  He has a 60 to 90 pack year but stopped smoking 10 years ago.   Past Medical History:  Diagnosis Date   Atrial flutter ([place])    Atrial myxoma    Left   Benign neoplasm of heart 08/07/2011   Chronic right shoulder pain 02/14/2018   Chronic right-sided low back pain with right-sided sciatica 02/14/2018   Diabetes mellitus type II    DM2 (diabetes mellitus, type 2) ([place])  HTN (hypertension)    Hyperlipidemia    Renal insufficiency    Unspecified pleural effusion 08/07/2011    Past Surgical History:  Procedure Laterality Date   CARDIAC CATHETERIZATION     cardioversion of atrial flutter.  07/31/11   Right miniature thoracotomy for resection of left atrial myxoma.  06/09/11   Dr Roxy Manns    Current Medications: Current Meds  Medication Sig   amLODipine (NORVASC) 10 MG tablet Take 10 mg by mouth daily.     apixaban (ELIQUIS) 5 MG TABS tablet Take 1 tablet (5 mg total) by mouth 2 (two) times daily.   aspirin 81 MG tablet Take 81 mg by mouth daily.     aspirin-acetaminophen-caffeine (EXCEDRIN MIGRAINE) 250-250-65 MG per tablet Take 1 tablet by mouth every 6 (six) hours as  needed.     benazepril (LOTENSIN) 40 MG tablet Take 40 mg by mouth daily.     BYSTOLIC 20 MG TABS Take 1 tablet by mouth daily.   canagliflozin (INVOKANA) 300 MG TABS tablet Take 300 mg by mouth daily before breakfast.   desoximetasone (TOPICORT) 0.05 % cream Apply topically 2 (two) times daily.     esomeprazole (NEXIUM) 40 MG capsule Take 40 mg by mouth daily before breakfast.     furosemide (LASIX) 40 MG tablet Take 40 mg by mouth 2 (two) times daily.    HUMALOG KWIKPEN 200 UNIT/ML SOPN Inject 10 Units as directed 2 (two) times daily.   Insulin Glargine (BASAGLAR KWIKPEN) 100 UNIT/ML SOPN Inject 60 Units into the skin 2 (two) times daily.   latanoprost (XALATAN) 0.005 % ophthalmic solution Place 1 drop into both eyes at bedtime.   minoxidil (LONITEN) 10 MG tablet Take 2 tablets (20 mg total) by mouth 2 (two) times daily.   rosuvastatin (CRESTOR) 10 MG tablet Take 10 mg by mouth daily.   sitaGLIPtin (JANUVIA) 100 MG tablet Take 100 mg by mouth daily.       Allergies:   Patient has no known allergies.   Social History   Socioeconomic History   Marital status: Married    Spouse name: Not on file   Number of children: Not on file   Years of education: Not on file   Highest education level: Not on file  Occupational History   Occupation: IT sales professional: SELF-EMPLOYED  Tobacco Use   Smoking status: Former    Types: Cigarettes    Quit date: 11/20/2006    Years since quitting: 14.6   Smokeless tobacco: Never  Vaping Use   Vaping Use: Never used  Substance and Sexual Activity   Alcohol use: No   Drug use: No   Sexual activity: Not on file  Other Topics Concern   Not on file  Social History Narrative   Not on file   Social Determinants of Health   Financial Resource Strain: Not on file  Food Insecurity: Not on file  Transportation Needs: Not on file  Physical Activity: Not on file  Stress: Not on file  Social Connections: Not on file     Family History: The patient's  family history includes CAD in his father.  ROS:   Please see the history of present illness.     EKGs/Labs/Other Studies Reviewed:    EKG:  The ekg ordered today demonstrates atrial flutter with variable AV block, heart rate 76, T wave abnormality, consider inferolateral ischemia.  QRS 92 ms.  Unchanged from EKG last month.  Recent Labs: No results found for requested  labs within last 8760 hours.  Recent Lipid Panel No results found for: CHOL, TRIG, HDL, CHOLHDL, VLDL, LDLCALC, LDLDIRECT  Physical Exam:    VS:  BP 132/80 (BP Location: Left Arm, Patient Position: Sitting, Cuff Size: Large)   Pulse 76   Ht '5\' 11"'$  (1.803 m)   Wt 264 lb 12.8 oz (120.1 kg)   SpO2 98%   BMI 36.93 kg/m     Wt Readings from Last 3 Encounters:  07/28/21 264 lb 12.8 oz (120.1 kg)  07/04/21 258 lb 9.6 oz (117.3 kg)  04/28/20 250 lb (113.4 kg)     GEN:  Well nourished, well developed, obese, comfortable sitting on exam table HEENT: Normal NECK: JVD difficult to assess secondary to body habitus, no carotid bruits CARDIAC: Irregular irregular, no murmurs, rubs, gallops RESPIRATORY:  Clear to auscultation without rales, wheezing or rhonchi  ABDOMEN: Distended MUSCULOSKELETAL: 2+ LE edema below shins bilaterally; No deformity  SKIN: Warm and dry NEUROLOGIC:  Alert and oriented PSYCHIATRIC:  Normal affect   ASSESSMENT AND PLAN   Atrial flutter, significantly symptomatic -Noted on EKG July 04, 2021 with symptomatic shortness of breath -History of cardioversion for a-flutter in 2012 -Echo 03/2020 with EF 50 to [id number], diastolic dysfunction, mildly dilated left atria, no significant valve disease. -Recommend diuresing and then performing DCCV right when the patient is able to lay flat. He started on Eliquis on August 15.    Acute on chronic diastolic CHF -Exacerbated by atrial flutter -Recommend IV diuresis -Check BNP, CMET, CBC, TSH and cycle troponins.  Precordial chest pain -Evaluate after  diuresis and restoration of normal rhythm. -He is high risk for CAD with history of hypertension, obesity, diabetes, family history of coronary disease and tobacco use history.  Left atrial myxoma - s/p resection of LAA myxoma in 2012 - intermittent periods of sharp right-sided chest discomfort related to incisional site - echo in 2018 showed no recurrence. - continue to monitor  Hypertension, reasonably controlled -Monitor blood pressure on current regimen.  He is taking amlodipine, benazepril, Bystolic and minoxidil. - Goal BP is <130/80.  Recommend DASH diet (high in vegetables, fruits, low-fat dairy products, whole grains, poultry, fish, and nuts and low in sweets, sugar-sweetened beverages, and red meats), salt restriction and increase physical activity.  Hyperlipidemia - LDL 48 in April 2022.   - Continue Crestor. - Discussed cholesterol lowering diets - Mediterranean diet, DASH diet, vegetarian diet, low-carbohydrate diet and avoidance of trans fats.  Discussed healthier choice substitutes.  Nuts, high-fiber foods, and fiber supplements may also improve lipids.    Obesity - Discussed how even a 5-10% weight loss can have cardiovascular benefits.   - Recommend moderate intensity activity for 30 minutes 5 days/week and the DASH diet.  Suspected sleep apnea -Needs eventual outpatient sleep study.  Sleep apnea  increases risk of further atrial arrhythmias  Disposition - Direct admit to [facility] cardiology team.  Discussed with Dr. Debara Pickett, doc of the day, Cherlynn Polo on the inpatient team and [place], our cardmaster.  We spoke with bed control and he will get a bed today.      Medication Adjustments/Labs and Tests Ordered: Current medicines are reviewed at length with the patient today.  Concerns regarding medicines are outlined above.  Orders Placed This Encounter  Procedures   EKG 12-Lead   No orders of the defined types were placed in this encounter.   Patient  Instructions  Medication Instructions:  No changes  *If you need a refill on  your cardiac medications before your next appointment, please call your pharmacy*   Lab Work: No Labs If you have labs (blood work) drawn today and your tests are completely normal, you will receive your results only by: [place] (if you have MyChart) OR A paper copy in the mail If you have any lab test that is abnormal or we need to change your treatment, we will call you to review the results.   Testing/Procedures: No Testing   Follow-Up: At [facility], you and your health needs are our priority.  As part of our continuing mission to provide you with exceptional heart care, we have created designated Provider Care Teams.  These Care Teams include your primary Cardiologist (physician) and Advanced Practice Providers (APPs -  Physician Assistants and Nurse Practitioners) who all work together to provide you with the care you need, when you need it.  We recommend signing up for the patient portal called "MyChart".  Sign up information is provided on this After Visit Summary.  MyChart is used to connect with patients for Virtual Visits (Telemedicine).  Patients are able to view lab/test results, encounter notes, upcoming appointments, etc.  Non-urgent messages can be sent to your provider as well.   To learn more about what you can do with MyChart, go to [email or web address].    Your next appointment:   To Be Determined  The format for your next appointment:   In Person  Provider: Dr. Margaretann Loveless       Signed, Warren Lacy, PA-C  07/28/2021 1:32 PM    [place]

## 2021-07-28 ENCOUNTER — Ambulatory Visit (INDEPENDENT_AMBULATORY_CARE_PROVIDER_SITE_OTHER): Payer: 59 | Admitting: Physician Assistant

## 2021-07-28 ENCOUNTER — Encounter (HOSPITAL_COMMUNITY): Payer: Self-pay | Admitting: Cardiology

## 2021-07-28 ENCOUNTER — Inpatient Hospital Stay (HOSPITAL_COMMUNITY)
Admission: AD | Admit: 2021-07-28 | Discharge: 2021-08-02 | DRG: 308 | Disposition: A | Discharge status: Home / Self Care | Payer: 59 | Source: Ambulatory Visit | Attending: Cardiology | Admitting: Cardiology

## 2021-07-28 ENCOUNTER — Other Ambulatory Visit: Payer: Self-pay

## 2021-07-28 ENCOUNTER — Encounter: Payer: Self-pay | Admitting: Physician Assistant

## 2021-07-28 VITALS — BP 132/80 | HR 76 | Ht 71.0 in | Wt 264.8 lb

## 2021-07-28 DIAGNOSIS — G4733 Obstructive sleep apnea (adult) (pediatric): Secondary | ICD-10-CM | POA: Diagnosis not present

## 2021-07-28 DIAGNOSIS — Z8249 Family history of ischemic heart disease and other diseases of the circulatory system: Secondary | ICD-10-CM | POA: Diagnosis not present

## 2021-07-28 DIAGNOSIS — Z794 Long term (current) use of insulin: Secondary | ICD-10-CM | POA: Diagnosis not present

## 2021-07-28 DIAGNOSIS — I4892 Unspecified atrial flutter: Secondary | ICD-10-CM

## 2021-07-28 DIAGNOSIS — I130 Hypertensive heart and chronic kidney disease with heart failure and stage 1 through stage 4 chronic kidney disease, or unspecified chronic kidney disease: Secondary | ICD-10-CM | POA: Diagnosis present

## 2021-07-28 DIAGNOSIS — Z6834 Body mass index (BMI) 34.0-34.9, adult: Secondary | ICD-10-CM

## 2021-07-28 DIAGNOSIS — E669 Obesity, unspecified: Secondary | ICD-10-CM | POA: Diagnosis present

## 2021-07-28 DIAGNOSIS — Z20822 Contact with and (suspected) exposure to covid-19: Secondary | ICD-10-CM | POA: Diagnosis present

## 2021-07-28 DIAGNOSIS — I480 Paroxysmal atrial fibrillation: Secondary | ICD-10-CM | POA: Diagnosis not present

## 2021-07-28 DIAGNOSIS — E785 Hyperlipidemia, unspecified: Secondary | ICD-10-CM | POA: Diagnosis present

## 2021-07-28 DIAGNOSIS — I483 Typical atrial flutter: Secondary | ICD-10-CM | POA: Diagnosis not present

## 2021-07-28 DIAGNOSIS — I5033 Acute on chronic diastolic (congestive) heart failure: Secondary | ICD-10-CM

## 2021-07-28 DIAGNOSIS — I10 Essential (primary) hypertension: Secondary | ICD-10-CM | POA: Diagnosis not present

## 2021-07-28 DIAGNOSIS — Z79899 Other long term (current) drug therapy: Secondary | ICD-10-CM

## 2021-07-28 DIAGNOSIS — E119 Type 2 diabetes mellitus without complications: Secondary | ICD-10-CM

## 2021-07-28 DIAGNOSIS — N1831 Chronic kidney disease, stage 3a: Secondary | ICD-10-CM | POA: Diagnosis not present

## 2021-07-28 DIAGNOSIS — I5032 Chronic diastolic (congestive) heart failure: Secondary | ICD-10-CM | POA: Diagnosis not present

## 2021-07-28 DIAGNOSIS — N183 Chronic kidney disease, stage 3 unspecified: Secondary | ICD-10-CM

## 2021-07-28 DIAGNOSIS — I440 Atrioventricular block, first degree: Secondary | ICD-10-CM | POA: Diagnosis present

## 2021-07-28 DIAGNOSIS — Z7982 Long term (current) use of aspirin: Secondary | ICD-10-CM

## 2021-07-28 DIAGNOSIS — E1122 Type 2 diabetes mellitus with diabetic chronic kidney disease: Secondary | ICD-10-CM | POA: Diagnosis not present

## 2021-07-28 DIAGNOSIS — D151 Benign neoplasm of heart: Secondary | ICD-10-CM | POA: Diagnosis not present

## 2021-07-28 DIAGNOSIS — Z87891 Personal history of nicotine dependence: Secondary | ICD-10-CM | POA: Diagnosis not present

## 2021-07-28 LAB — BASIC METABOLIC PANEL
Anion gap: 8 (ref 5–15)
BUN: 21 mg/dL (ref 8–23)
CO2: 30 mmol/L (ref 22–32)
Calcium: 8.8 mg/dL — ABNORMAL LOW (ref 8.9–10.3)
Chloride: 103 mmol/L (ref 98–111)
Creatinine, Ser: 1.96 mg/dL — ABNORMAL HIGH (ref 0.61–1.24)
GFR, Estimated: 38 mL/min — ABNORMAL LOW (ref >60)
Glucose, Bld: 228 mg/dL — ABNORMAL HIGH (ref 70–99)
Potassium: 4.2 mmol/L (ref 3.5–5.1)
Sodium: 141 mmol/L (ref 135–145)

## 2021-07-28 LAB — SARS CORONAVIRUS 2 (TAT 6-24 HRS): SARS Coronavirus 2: NEGATIVE

## 2021-07-28 LAB — CBC
HCT: 45.6 % (ref 39.0–52.0)
Hemoglobin: 15.5 g/dL (ref 13.0–17.0)
MCH: 26.8 pg (ref 26.0–34.0)
MCHC: 34 g/dL (ref 30.0–36.0)
MCV: 78.8 fL — ABNORMAL LOW (ref 80.0–100.0)
Platelets: 218 10*3/uL (ref 150–400)
RBC: 5.79 MIL/uL (ref 4.22–5.81)
RDW: 15.6 % — ABNORMAL HIGH (ref 11.5–15.5)
WBC: 4.9 10*3/uL (ref 4.0–10.5)
nRBC: 0 % (ref 0.0–0.2)

## 2021-07-28 LAB — HEMOGLOBIN A1C
Hgb A1c MFr Bld: 6.7 % — ABNORMAL HIGH (ref 4.8–5.6)
Mean Plasma Glucose: 145.59 mg/dL

## 2021-07-28 LAB — GLUCOSE, CAPILLARY
Glucose-Capillary: 214 mg/dL — ABNORMAL HIGH (ref 70–99)
Glucose-Capillary: 244 mg/dL — ABNORMAL HIGH (ref 70–99)

## 2021-07-28 LAB — BRAIN NATRIURETIC PEPTIDE: B Natriuretic Peptide: 111.2 pg/mL — ABNORMAL HIGH (ref 0.0–100.0)

## 2021-07-28 MED ORDER — AMLODIPINE BESYLATE 10 MG PO TABS
10.0000 mg | ORAL_TABLET | Freq: Every day | ORAL | Status: DC
  Administered 2021-07-29 – 2021-08-02 (×6): 10 mg via ORAL
  Filled 2021-07-28 (×6): qty 1

## 2021-07-28 MED ORDER — ACETAMINOPHEN 325 MG PO TABS: 650.0000 mg | ORAL_TABLET | ORAL | Status: DC | PRN

## 2021-07-28 MED ORDER — APIXABAN 5 MG PO TABS: 5.0000 mg | ORAL_TABLET | Freq: Two times a day (BID) | ORAL | Status: DC

## 2021-07-28 MED ORDER — INFLUENZA VAC SPLIT QUAD 0.5 ML IM SUSY
0.5000 mL | PREFILLED_SYRINGE | INTRAMUSCULAR | Status: DC
  Filled 2021-07-28: qty 0.5

## 2021-07-28 MED ORDER — FUROSEMIDE 10 MG/ML IJ SOLN
60.0000 mg | Freq: Two times a day (BID) | INTRAMUSCULAR | Status: DC
  Administered 2021-07-28 – 2021-07-31 (×6): 60 mg via INTRAVENOUS
  Filled 2021-07-28 (×6): qty 6

## 2021-07-28 MED ORDER — BENAZEPRIL HCL 40 MG PO TABS
40.0000 mg | ORAL_TABLET | Freq: Every day | ORAL | Status: DC
  Administered 2021-07-29 – 2021-08-02 (×6): 40 mg via ORAL
  Filled 2021-07-28 (×6): qty 1

## 2021-07-28 MED ORDER — PNEUMOCOCCAL VAC POLYVALENT 25 MCG/0.5ML IJ INJ
0.5000 mL | INJECTION | INTRAMUSCULAR | Status: DC
  Filled 2021-07-28: qty 0.5

## 2021-07-28 MED ORDER — FUROSEMIDE 10 MG/ML IJ SOLN: 80.0000 mg | Freq: Two times a day (BID) | INTRAMUSCULAR | Status: DC

## 2021-07-28 MED ORDER — NEBIVOLOL HCL 10 MG PO TABS
20.0000 mg | ORAL_TABLET | Freq: Every day | ORAL | Status: DC
  Administered 2021-07-28 – 2021-07-31 (×4): 20 mg via ORAL
  Filled 2021-07-28 (×4): qty 2

## 2021-07-28 MED ORDER — ROSUVASTATIN CALCIUM 5 MG PO TABS
10.0000 mg | ORAL_TABLET | Freq: Every day | ORAL | Status: DC
  Administered 2021-07-29 – 2021-08-02 (×6): 10 mg via ORAL
  Filled 2021-07-28 (×6): qty 2

## 2021-07-28 MED ORDER — APIXABAN 5 MG PO TABS
5.0000 mg | ORAL_TABLET | Freq: Two times a day (BID) | ORAL | Status: DC
  Administered 2021-07-28 – 2021-08-02 (×10): 5 mg via ORAL
  Filled 2021-07-28 (×10): qty 1

## 2021-07-28 MED ORDER — ONDANSETRON HCL 4 MG/2ML IJ SOLN: 4.0000 mg | Freq: Four times a day (QID) | INTRAMUSCULAR | Status: DC | PRN

## 2021-07-28 MED ORDER — PANTOPRAZOLE SODIUM 40 MG PO TBEC
40.0000 mg | DELAYED_RELEASE_TABLET | Freq: Every day | ORAL | Status: DC
  Administered 2021-07-29 – 2021-08-02 (×6): 40 mg via ORAL
  Filled 2021-07-28 (×6): qty 1

## 2021-07-28 MED ORDER — INSULIN ASPART 100 UNIT/ML IJ SOLN
0.0000 [IU] | Freq: Three times a day (TID) | INTRAMUSCULAR | Status: DC
Start: 2021-07-29 — End: 2021-08-02
  Administered 2021-07-29 (×3): 3 [IU] via SUBCUTANEOUS
  Administered 2021-07-30: 5 [IU] via SUBCUTANEOUS
  Administered 2021-07-30 (×2): 3 [IU] via SUBCUTANEOUS
  Administered 2021-07-31: 5 [IU] via SUBCUTANEOUS
  Administered 2021-07-31: 3 [IU] via SUBCUTANEOUS
  Administered 2021-07-31: 5 [IU] via SUBCUTANEOUS
  Administered 2021-08-01: 3 [IU] via SUBCUTANEOUS
  Administered 2021-08-01: 8 [IU] via SUBCUTANEOUS
  Administered 2021-08-01: 5 [IU] via SUBCUTANEOUS
  Administered 2021-08-02 (×2): 3 [IU] via SUBCUTANEOUS

## 2021-07-28 MED ORDER — MINOXIDIL 10 MG PO TABS
20.0000 mg | ORAL_TABLET | Freq: Two times a day (BID) | ORAL | Status: DC
  Administered 2021-07-28 – 2021-08-02 (×10): 20 mg via ORAL
  Filled 2021-07-28 (×11): qty 2

## 2021-07-28 MED ORDER — TAMSULOSIN HCL 0.4 MG PO CAPS
0.4000 mg | ORAL_CAPSULE | Freq: Every day | ORAL | Status: DC
  Administered 2021-07-29 – 2021-08-02 (×6): 0.4 mg via ORAL
  Filled 2021-07-28 (×6): qty 1

## 2021-07-28 MED ORDER — APIXABAN 5 MG PO TABS
5.0000 mg | ORAL_TABLET | ORAL | Status: AC
  Administered 2021-07-28: 5 mg via ORAL
  Filled 2021-07-28: qty 1

## 2021-07-28 MED ORDER — NITROGLYCERIN 0.4 MG SL SUBL
0.4000 mg | SUBLINGUAL_TABLET | SUBLINGUAL | Status: DC | PRN
Start: 2021-07-28 — End: 2021-08-02

## 2021-07-28 NOTE — Patient Instructions (Addendum)
Medication Instructions:  No changes  *If you need a refill on your cardiac medications before your next appointment, please call your pharmacy*   Lab Work: No Labs If you have labs (blood work) drawn today and your tests are completely normal, you will receive your results only by: [place] (if you have MyChart) OR A paper copy in the mail If you have any lab test that is abnormal or we need to change your treatment, we will call you to review the results.   Testing/Procedures: No Testing   Follow-Up: At [facility], you and your health needs are our priority.  As part of our continuing mission to provide you with exceptional heart care, we have created designated Provider Care Teams.  These Care Teams include your primary Cardiologist (physician) and Advanced Practice Providers (APPs -  Physician Assistants and Nurse Practitioners) who all work together to provide you with the care you need, when you need it.  We recommend signing up for the patient portal called "MyChart".  Sign up information is provided on this After Visit Summary.  MyChart is used to connect with patients for Virtual Visits (Telemedicine).  Patients are able to view lab/test results, encounter notes, upcoming appointments, etc.  Non-urgent messages can be sent to your provider as well.   To learn more about what you can do with MyChart, go to [email or web address].    Your next appointment:   To Be Determined  The format for your next appointment:   In Person  Provider: Dr. Margaretann Loveless

## 2021-07-28 NOTE — H&P (Addendum)
Cardiology Admission History and Physical:   Patient ID: Richard Barton MRN: [id number]; DOB: 1956/05/23   Admission date: 07/28/2021  PCP:  Haywood Pao, MD   [facility] Providers Cardiologist:  Elouise Munroe, MD        Chief Complaint:  SOB, weight gain   Patient Profile:   Richard Barton is a 65 y.o. male with  a hx of left atrial myxoma with resection in 2012, A-flutter with cardioversion 2012, HTN, HLD, severe LVH, DM and CKD who is being seen 07/28/2021 for the evaluation of SOB and weight gain. He is directly admitted from clinic.    He saw Richard Barton in clinic on July 04, 2021 and noticed increased shortness of breath x1 month.  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  Patient noticed dietary indiscretion.  EKG revealed atrial flutter with rate of 67 bpm.  He was started on Eliquis 5 mg twice daily.  He thinks he likely had a coronary evaluation before his left atrial myxoma resection in 2012. He has diabetes, obesity, family history of heart disease with his father having MI young and a history of tobacco use.  He has a 60 to 90 pack year but stopped smoking 10 years ago.    History of Present Illness:   Richard Barton seen in clinic today by Richard Barton for follow up. Noted constant central chest tightness with associated tired shoulders, heartburn and persistent fluid retention. Seen by PCP few week ago for weight gain and dyspnea. Increased lasix with weight loss but still above baseline. Sleeping on recliner.   The patient was directly admitted for diuresis and then DCCV when stable.   Unfortunately, Patient hasn't took any of his medications today. Last dose of Eliquis 9/7 @ 10pm.    Past Medical History:  Diagnosis Date   Atrial flutter ([place])    Atrial myxoma    Left   Benign neoplasm of heart 08/07/2011   Chronic right shoulder pain 02/14/2018   Chronic right-sided low back pain with right-sided sciatica  02/14/2018   Diabetes mellitus type II    DM2 (diabetes mellitus, type 2) (HCC)    HTN (hypertension)    Hyperlipidemia    Renal insufficiency    Unspecified pleural effusion 08/07/2011    Past Surgical History:  Procedure Laterality Date   CARDIAC CATHETERIZATION     cardioversion of atrial flutter.  07/31/11   Right miniature thoracotomy for resection of left atrial myxoma.  06/09/11   Dr Roxy Manns     Medications Prior to Admission: Prior to Admission medications   Medication Sig Start Date End Date Taking? Authorizing Provider  amLODipine (NORVASC) 10 MG tablet Take 10 mg by mouth daily.      [provider]  apixaban (ELIQUIS) 5 MG TABS tablet Take 1 tablet (5 mg total) by mouth 2 (two) times daily. 07/04/21   Deberah Pelton, NP  aspirin 81 MG tablet Take 81 mg by mouth daily.      [provider]  aspirin-acetaminophen-caffeine (EXCEDRIN MIGRAINE) [phone] MG per tablet Take 1 tablet by mouth every 6 (six) hours as needed.      [provider]  benazepril (LOTENSIN) 40 MG tablet Take 40 mg by mouth daily.      [provider]  BYSTOLIC 20 MG TABS Take 1 tablet by mouth daily. 06/22/21   [provider]  canagliflozin (INVOKANA) 300 MG TABS tablet Take 300 mg  by mouth daily before breakfast.    [provider]  Continuous Blood Gluc Sensor (FREESTYLE LIBRE 2 SENSOR) MISC  07/22/21   [provider]  desoximetasone (TOPICORT) 0.05 % cream Apply topically 2 (two) times daily.      [provider]  diphenhydrAMINE (BENADRYL) 25 MG tablet Take 25 mg by mouth at bedtime as needed.   Patient not taking: Reported on 07/28/2021    [provider]  doxycycline (VIBRA-TABS) 100 MG tablet Take 100 mg by mouth 2 (two) times daily. 07/19/21   [provider]  esomeprazole (NEXIUM) 40 MG capsule Take 40 mg by mouth daily before breakfast.      [provider]  furosemide (LASIX) 40 MG tablet Take 40 mg by  mouth 2 (two) times daily.     [provider]  guaiFENesin (MUCINEX) 600 MG 12 hr tablet Take 1,200 mg by mouth as needed.   Patient not taking: Reported on 07/28/2021    [provider]  HUMALOG KWIKPEN 200 UNIT/ML SOPN Inject 10 Units as directed 2 (two) times daily. 09/08/18   [provider]  Insulin Glargine (BASAGLAR KWIKPEN) 100 UNIT/ML SOPN Inject 60 Units into the skin 2 (two) times daily.    [provider]  latanoprost (XALATAN) 0.005 % ophthalmic solution Place 1 drop into both eyes at bedtime.    [provider]  minoxidil (LONITEN) 10 MG tablet Take 2 tablets (20 mg total) by mouth 2 (two) times daily. 07/04/21   Deberah Pelton, NP  potassium chloride (KLOR-CON) 10 MEQ tablet Take 10 mEq by mouth 2 (two) times daily. 07/19/21   [provider]  rosuvastatin (CRESTOR) 10 MG tablet Take 10 mg by mouth daily.    [provider]  sitaGLIPtin (JANUVIA) 100 MG tablet Take 100 mg by mouth daily.      [provider]  tadalafil (CIALIS) 10 MG tablet Take 10 mg by mouth daily as needed.   Patient not taking: Reported on 07/28/2021    [provider]  tamsulosin (FLOMAX) 0.4 MG CAPS capsule Take 0.4 mg by mouth daily. 07/22/21   [provider]     Allergies:   No Known Allergies  Social History:   Social History   Socioeconomic History   Marital status: Married    Spouse name: Not on file   Number of children: Not on file   Years of education: Not on file   Highest education level: Not on file  Occupational History   Occupation: IT sales professional: SELF-EMPLOYED  Tobacco Use   Smoking status: Former    Types: Cigarettes    Quit date: 11/20/2006    Years since quitting: 14.6   Smokeless tobacco: Never  Vaping Use   Vaping Use: Never used  Substance and Sexual Activity   Alcohol use: No   Drug use: No   Sexual activity: Not on file  Other Topics Concern   Not on file  Social History  Narrative   Not on file   Social Determinants of Health   Financial Resource Strain: Not on file  Food Insecurity: Not on file  Transportation Needs: Not on file  Physical Activity: Not on file  Stress: Not on file  Social Connections: Not on file  Intimate Partner Violence: Not on file    Family History:   The patient's family history includes CAD in his father.    ROS:  Please see the history of present illness.  All other ROS reviewed and negative.     Physical Exam/Data:   Vitals:   07/28/21 1600 07/28/21 1626  BP:  (!) 159/75  Pulse:  79  Resp:  20  Temp:  99.1 F (37.3 C)  TempSrc:  Oral  SpO2:  97%  Weight:  119.2 kg  Height: 5' 11.5" (1.816 m)    No intake or output data in the 24 hours ending 07/28/21 1656 Last 3 Weights 07/28/2021 07/28/2021 07/04/2021  Weight (lbs) 262 lb 12.6 oz 264 lb 12.8 oz 258 lb 9.6 oz  Weight (kg) 119.2 kg 120.112 kg 117.3 kg     Body mass index is 36.14 kg/m.  General:  Well nourished, well developed, in no acute distress HEENT: normal Lymph: no adenopathy Neck: + JVD Endocrine:  No thryomegaly Vascular: No carotid bruits; FA pulses 2+ bilaterally without bruits  Cardiac:  normal S1, S2; IR ; no murmur  Lungs:  clear to auscultation bilaterally, no wheezing, rhonchi or rales  Abd: soft, nontender, no hepatomegaly  Ext: +  edema Musculoskeletal:  No deformities, BUE and BLE strength normal and equal Skin: warm and dry  Neuro:  CNs 2-12 intact, no focal abnormalities noted Psych:  Normal affect    EKG:  The ECG tPending   Relevant CV Studies:  Echo 03/2020  1. Left ventricular ejection fraction, by estimation, is 50 to 55%. The  left ventricle has low normal function. The left ventricle demonstrates  regional wall motion abnormalities (see scoring diagram/findings for  description). Left ventricular diastolic   parameters are consistent with Grade II diastolic dysfunction  (pseudonormalization).   2. Right ventricular  systolic function is normal. The right ventricular  size is mildly enlarged. There is normal pulmonary artery systolic  pressure.   3. Left atrial size was mildly dilated.   4. The mitral valve is normal in structure. Trivial mitral valve  regurgitation. No evidence of mitral stenosis.   5. The aortic valve is tricuspid. Aortic valve regurgitation is not  visualized. No aortic stenosis is present.   6. The inferior vena cava is dilated in size with <50% respiratory  variability, suggesting right atrial pressure of 15 mmHg.    Radiology/Studies:  No results found.   Assessment and Plan:   Atrial flutter, significantly symptomatic -Noted on EKG July 04, 2021 with symptomatic shortness of breath -History of cardioversion for a-flutter in 2012 -Echo 03/2020 with EF 50 to [id number], diastolic dysfunction, mildly dilated left atria, no significant valve disease. -Plan for diuresis and then performing DCCV - Update echo - Check TSH - Personally spoke with pharmacist>> will make sure he will get 2 doses of Eliquis today   2, Acute on chronic diastolic CHF -Exacerbated by atrial flutter -Plan IV diuresis - Baseline weight ~ 250lb (his wt was 246lb when seen by Dr. Margaretann Loveless 02/2021) - Check BNP  3. Left atrial myxoma - s/p resection of LAA myxoma in 2012 -echo in 2018 showed no recurrence. - Update echo  4. CKD III -Scr was 1.4 on 02/2020 - Follow with diuresis    Risk Assessment/Risk Scores:   New York Heart Association (NYHA) Functional Class NYHA Class III  CHA2DS2-VASc Score =     This indicates a  % annual risk of stroke. The patient's score is based upon:        Severity of Illness: The appropriate patient status for this patient is INPATIENT. Inpatient status is judged to be reasonable and necessary in order to provide the required  intensity of service to ensure the patient's safety. The patient's presenting symptoms, physical exam findings, and initial radiographic and  laboratory data in the context of their chronic comorbidities is felt to place them at high risk for further clinical deterioration. Furthermore, it is not anticipated that the patient will be medically stable for discharge from the hospital within 2 midnights of admission. The following factors support the patient status of inpatient.   " The patient's presenting symptoms include CP, SOB. " The worrisome physical exam findings include LE edena. " The initial radiographic and laboratory data are worrisome because of pending . " The chronic co-morbidities include Aflutter, CHF.   * I certify that at the point of admission it is my clinical judgment that the patient will require inpatient hospital care spanning beyond 2 midnights from the point of admission due to high intensity of service, high risk for further deterioration and high frequency of surveillance required.*   For questions or updates, please contact [place] Please consult [email or web address] for contact info under     Jarrett Soho, PA  07/28/2021 4:56 PM    As above, patient seen and examined.  Briefly he is a 65 year old male with past medical history of left atrial myxoma resection, history of atrial flutter status post cardioversion, hypertension, hyperlipidemia, diabetes mellitus, chronic stage IIIa kidney disease admitted with acute diastolic congestive heart failure and recurrent atrial flutter.  Patient states that for the past month he has noticed progressive dyspnea on exertion.  He now has orthopnea, bilateral lower extremity edema and has gained 25 pounds.  He denies palpitations or syncope.  He has some pain in his upper chest with inspiration.  Note he was seen on August 15 for some of the symptoms and was noted to be in atrial flutter.  He was placed on Eliquis at that time and has missed no doses. Electrocardiogram August 15 shows atrial flutter and inferolateral T wave inversion.  1 atrial flutter-plan to  continue Bystolic for rate control.  Continue apixaban 5 mg twice daily.  This is likely the cause of his recurrent congestive heart failure.  As his CHF improves we will plan to proceed with elective cardioversion.  May need to see electrophysiology as an outpatient to consider ablation.  Repeat echocardiogram.  2 acute diastolic congestive heart failure-likely related to atrial flutter.  He is markedly volume overloaded on examination.  We will treat with Lasix 60 mg IV twice daily.  Follow renal function closely.  3 hypertension-would continue preadmission blood pressure medications and follow.  Adjust as needed.  4 history of left atrial myxoma resection-repeat echocardiogram.  5 history of chronic 3A kidney disease-laboratories are pending.  Will need to follow renal function closely with diuresis.  6 hyperlipidemia-continue statin.  Kirk Ruths, MD

## 2021-07-28 NOTE — Progress Notes (Signed)
Patient direct admit from home. Vitals assessed, telemetry placed, covid swab done and cardiology paged. Patient and family oriented to the room and safety procedures

## 2021-07-29 ENCOUNTER — Inpatient Hospital Stay (HOSPITAL_COMMUNITY): Payer: 59

## 2021-07-29 ENCOUNTER — Other Ambulatory Visit (HOSPITAL_COMMUNITY): Payer: Self-pay

## 2021-07-29 DIAGNOSIS — I5032 Chronic diastolic (congestive) heart failure: Secondary | ICD-10-CM | POA: Diagnosis not present

## 2021-07-29 DIAGNOSIS — I5033 Acute on chronic diastolic (congestive) heart failure: Secondary | ICD-10-CM

## 2021-07-29 LAB — BASIC METABOLIC PANEL
Anion gap: 8 (ref 5–15)
BUN: 19 mg/dL (ref 8–23)
CO2: 27 mmol/L (ref 22–32)
Calcium: 8.7 mg/dL — ABNORMAL LOW (ref 8.9–10.3)
Chloride: 102 mmol/L (ref 98–111)
Creatinine, Ser: 1.76 mg/dL — ABNORMAL HIGH (ref 0.61–1.24)
GFR, Estimated: 43 mL/min — ABNORMAL LOW (ref >60)
Glucose, Bld: 149 mg/dL — ABNORMAL HIGH (ref 70–99)
Potassium: 3.7 mmol/L (ref 3.5–5.1)
Sodium: 137 mmol/L (ref 135–145)

## 2021-07-29 LAB — CBC
HCT: 43.7 % (ref 39.0–52.0)
Hemoglobin: 14.9 g/dL (ref 13.0–17.0)
MCH: 26.7 pg (ref 26.0–34.0)
MCHC: 34.1 g/dL (ref 30.0–36.0)
MCV: 78.3 fL — ABNORMAL LOW (ref 80.0–100.0)
Platelets: 221 10*3/uL (ref 150–400)
RBC: 5.58 MIL/uL (ref 4.22–5.81)
RDW: 15.6 % — ABNORMAL HIGH (ref 11.5–15.5)
WBC: 4.2 10*3/uL (ref 4.0–10.5)
nRBC: 0 % (ref 0.0–0.2)

## 2021-07-29 LAB — GLUCOSE, CAPILLARY
Glucose-Capillary: 161 mg/dL — ABNORMAL HIGH (ref 70–99)
Glucose-Capillary: 199 mg/dL — ABNORMAL HIGH (ref 70–99)
Glucose-Capillary: 187 mg/dL — ABNORMAL HIGH (ref 70–99)
Glucose-Capillary: 161 mg/dL — ABNORMAL HIGH (ref 70–99)

## 2021-07-29 LAB — ECHOCARDIOGRAM COMPLETE
AR max vel: 4.21 cm2
AV Area VTI: 3.71 cm2
AV Area mean vel: 4.16 cm2
AV Mean grad: 4 mmHg
AV Peak grad: 6.6 mmHg
Ao pk vel: 1.28 m/s
Area-P 1/2: 3.86 cm2
Height: 71.5 in
S' Lateral: 3.6 cm
Single Plane A4C EF: 53.8 %
Weight: 4139.2 oz

## 2021-07-29 LAB — LIPID PANEL
Cholesterol: 91 mg/dL (ref 0–200)
HDL: 26 mg/dL — ABNORMAL LOW (ref >40)
LDL Cholesterol: 48 mg/dL (ref 0–99)
Total CHOL/HDL Ratio: 3.5 RATIO
Triglycerides: 86 mg/dL (ref <150)
VLDL: 17 mg/dL (ref 0–40)

## 2021-07-29 LAB — HIV ANTIBODY (ROUTINE TESTING W REFLEX): HIV Screen 4th Generation wRfx: NONREACTIVE

## 2021-07-29 LAB — TSH: TSH: 1.171 u[IU]/mL (ref 0.350–4.500)

## 2021-07-29 MED ORDER — POTASSIUM CHLORIDE CRYS ER 20 MEQ PO TBCR
40.0000 meq | EXTENDED_RELEASE_TABLET | Freq: Once | ORAL | Status: AC
  Administered 2021-07-29: 40 meq via ORAL
  Filled 2021-07-29: qty 2

## 2021-07-29 MED ORDER — EMPAGLIFLOZIN 10 MG PO TABS
10.0000 mg | ORAL_TABLET | Freq: Every day | ORAL | Status: DC
  Administered 2021-07-29 – 2021-08-02 (×5): 10 mg via ORAL
  Filled 2021-07-29 (×5): qty 1

## 2021-07-29 NOTE — TOC Benefit Eligibility Note (Signed)
Patient Teacher, English as a foreign language completed.    The patient is currently admitted and upon discharge could be taking Jardiance 10 mg.  The current 30 day co-pay is, $0.00.   The patient is currently admitted and upon discharge could be taking Farxiga 10 mg.  Non Formulary  The patient is insured through [employer] [place], [place] Patient Advocate Specialist [place] Team Direct Number: [phone]  Fax: [phone]

## 2021-07-29 NOTE — Progress Notes (Signed)
Progress Note  Patient Name: Richard Barton Date of Encounter: 07/29/2021  [facility] Cardiologist: Elouise Munroe, MD   Subjective   No CP; remains dyspneic  Inpatient Medications    Scheduled Meds:  amLODipine  10 mg Oral Daily   apixaban  5 mg Oral BID   benazepril  40 mg Oral Daily   empagliflozin  10 mg Oral Daily   furosemide  60 mg Intravenous BID   influenza vac split quadrivalent PF  0.5 mL Intramuscular Tomorrow-1000   insulin aspart  0-15 Units Subcutaneous TID WC   minoxidil  20 mg Oral BID   nebivolol  20 mg Oral Daily   pantoprazole  40 mg Oral Daily   pneumococcal 23 valent vaccine  0.5 mL Intramuscular Tomorrow-1000   rosuvastatin  10 mg Oral Daily   tamsulosin  0.4 mg Oral Daily   Continuous Infusions:  PRN Meds: acetaminophen, nitroGLYCERIN, ondansetron (ZOFRAN) IV   Vital Signs    Vitals:   07/28/21 2357 07/29/21 0400 07/29/21 0759 07/29/21 1141  BP: 128/80 (!) 104/55 (!) 113/57 125/68  Pulse: 67 (!) 57 (!) 52 (!) 55  Resp: '20 20 18 18  '$ Temp: 98.5 F (36.9 C) 97.8 F (36.6 C) 98.1 F (36.7 C) 98.1 F (36.7 C)  TempSrc: Oral Oral Oral Oral  SpO2: 94% 96% 93% 97%  Weight:  117.3 kg    Height:        Intake/Output Summary (Last 24 hours) at 07/29/2021 1153 Last data filed at 07/29/2021 0900 Gross per 24 hour  Intake 960 ml  Output 4325 ml  Net -3365 ml   Last 3 Weights 07/29/2021 07/28/2021 07/28/2021  Weight (lbs) 258 lb 11.2 oz 262 lb 12.6 oz 264 lb 12.8 oz  Weight (kg) 117.346 kg 119.2 kg 120.112 kg      Telemetry    Atrial flutter - Personally Reviewed  Physical Exam   GEN: No acute distress.   Neck: supple Cardiac: irregular Respiratory: Clear to auscultation bilaterally. GI: Soft, nontender, non-distended  MS: 2+ edema Neuro:  Nonfocal  Psych: Normal affect   Labs     Chemistry Recent Labs  Lab 07/28/21 1721 07/29/21 0339  NA 141 137  K 4.2 3.7  CL 103 102  CO2 30 27  GLUCOSE 228* 149*  BUN 21 19   CREATININE 1.96* 1.76*  CALCIUM 8.8* 8.7*  GFRNONAA 38* 31*  ANIONGAP 8 8     Hematology Recent Labs  Lab 07/28/21 1721 07/29/21 0339  WBC 4.9 4.2  RBC 5.79 5.58  HGB 15.5 14.9  HCT 45.6 43.7  MCV 78.8* 78.3*  MCH 26.8 26.7  MCHC 34.0 34.1  RDW 15.6* 15.6*  PLT 218 221    BNP Recent Labs  Lab 07/28/21 1721  BNP 111.2*    Patient Profile     65 year old male with past medical history of left atrial myxoma resection, history of atrial flutter status post cardioversion, hypertension, hyperlipidemia, diabetes mellitus, chronic stage IIIa kidney disease admitted with acute diastolic congestive heart failure and recurrent atrial flutter.    Assessment & Plan    1 acute on chronic diastolic congestive heart failure-I/O-3605.  Patient remains volume overloaded.  We will continue Lasix at present dose.  Follow renal function closely.  Repeat echocardiogram pending.  2 atrial flutter-continue Bystolic for rate control.  Continue apixaban.  This is likely the primary reason for his congestive heart failure exacerbation.  We will plan cardioversion early next week as his CHF  improves.  Can ask electrophysiology to see as an outpatient for consideration of repeat atrial flutter ablation.  3 chronic stage IIIa kidney disease-follow renal function closely with diuresis.  4 hypertension-blood pressure controlled.  Continue present medications and follow.  5 history of atrial myxoma resection  For questions or updates, please contact [place] Please consult [email or web address] for contact info under        Signed, Kirk Ruths, MD  07/29/2021, 11:53 AM

## 2021-07-29 NOTE — Plan of Care (Signed)
Dr Stanford Breed recommend DCCV on Monday 08/01/21, patient is scheduled for Monday, orders placed, NPO after midnight on 08/01/21

## 2021-07-30 LAB — BASIC METABOLIC PANEL
Anion gap: 11 (ref 5–15)
BUN: 23 mg/dL (ref 8–23)
CO2: 27 mmol/L (ref 22–32)
Calcium: 9 mg/dL (ref 8.9–10.3)
Chloride: 101 mmol/L (ref 98–111)
Creatinine, Ser: 1.82 mg/dL — ABNORMAL HIGH (ref 0.61–1.24)
GFR, Estimated: 41 mL/min — ABNORMAL LOW (ref >60)
Glucose, Bld: 218 mg/dL — ABNORMAL HIGH (ref 70–99)
Potassium: 3.8 mmol/L (ref 3.5–5.1)
Sodium: 139 mmol/L (ref 135–145)

## 2021-07-30 LAB — GLUCOSE, CAPILLARY
Glucose-Capillary: 176 mg/dL — ABNORMAL HIGH (ref 70–99)
Glucose-Capillary: 177 mg/dL — ABNORMAL HIGH (ref 70–99)
Glucose-Capillary: 205 mg/dL — ABNORMAL HIGH (ref 70–99)
Glucose-Capillary: 194 mg/dL — ABNORMAL HIGH (ref 70–99)
Glucose-Capillary: 229 mg/dL — ABNORMAL HIGH (ref 70–99)

## 2021-07-30 NOTE — Progress Notes (Signed)
Progress Note  Patient Name: Richard Barton Date of Encounter: 07/30/2021  [facility] Cardiologist: Elouise Munroe, MD   Subjective   Dyspnea improving; no CP  Inpatient Medications    Scheduled Meds:  amLODipine  10 mg Oral Daily   apixaban  5 mg Oral BID   benazepril  40 mg Oral Daily   empagliflozin  10 mg Oral Daily   furosemide  60 mg Intravenous BID   influenza vac split quadrivalent PF  0.5 mL Intramuscular Tomorrow-1000   insulin aspart  0-15 Units Subcutaneous TID WC   minoxidil  20 mg Oral BID   nebivolol  20 mg Oral Daily   pantoprazole  40 mg Oral Daily   pneumococcal 23 valent vaccine  0.5 mL Intramuscular Tomorrow-1000   rosuvastatin  10 mg Oral Daily   tamsulosin  0.4 mg Oral Daily   Continuous Infusions:  PRN Meds: acetaminophen, nitroGLYCERIN, ondansetron (ZOFRAN) IV   Vital Signs    Vitals:   07/29/21 1614 07/29/21 1955 07/30/21 0033 07/30/21 0413  BP: (!) 106/53 (!) 142/59 140/68 (!) 146/69  Pulse: (!) 59 (!) 48 (!) 55 60  Resp: 17 20 (!) 22 20  Temp: 98 F (36.7 C) 98 F (36.7 C) 97.6 F (36.4 C) 98.1 F (36.7 C)  TempSrc:  Oral Oral Oral  SpO2: 95% 95% 93% 97%  Weight:    115.8 kg  Height:        Intake/Output Summary (Last 24 hours) at 07/30/2021 0737 Last data filed at 07/30/2021 0418 Gross per 24 hour  Intake 702 ml  Output 5700 ml  Net -4998 ml    Last 3 Weights 07/30/2021 07/29/2021 07/28/2021  Weight (lbs) 255 lb 3.2 oz 258 lb 11.2 oz 262 lb 12.6 oz  Weight (kg) 115.758 kg 117.346 kg 119.2 kg      Telemetry    Atrial flutter - Personally Reviewed  Physical Exam   GEN: NAD Neck: JVP difficult to assess Cardiac: irregular, no rub Respiratory: CTA GI: Soft, NT/ND MS: 2+ edema Neuro:  Grossly intact Psych: Normal affect   Labs     Chemistry Recent Labs  Lab 07/28/21 1721 07/29/21 0339 07/30/21 0324  NA 141 137 139  K 4.2 3.7 3.8  CL 103 102 101  CO2 '30 27 27  '$ GLUCOSE 228* 149* 218*  BUN '21 19 23   '$ CREATININE 1.96* 1.76* 1.82*  CALCIUM 8.8* 8.7* 9.0  GFRNONAA 38* 43* 41*  ANIONGAP '8 8 11      '$ Hematology Recent Labs  Lab 07/28/21 1721 07/29/21 0339  WBC 4.9 4.2  RBC 5.79 5.58  HGB 15.5 14.9  HCT 45.6 43.7  MCV 78.8* 78.3*  MCH 26.8 26.7  MCHC 34.0 34.1  RDW 15.6* 15.6*  PLT 218 221     BNP Recent Labs  Lab 07/28/21 1721  BNP 111.2*     Patient Profile     65 year old male with past medical history of left atrial myxoma resection, history of atrial flutter status post cardioversion, hypertension, hyperlipidemia, diabetes mellitus, chronic stage IIIa kidney disease admitted with acute diastolic congestive heart failure and recurrent atrial flutter.    Assessment & Plan    1 acute on chronic diastolic congestive heart failure-I/O-4998; wt 115.8 kg.  Much improved.  We will continue Lasix at present dose and follow renal function.  Follow-up echocardiogram showed normal LV function.  2 atrial flutter-continue Bystolic for rate control.  Continue apixaban.  This is likely the primary reason  for his congestive heart failure exacerbation.  Plan to proceed with cardioversion on Monday as he has missed no doses of apixaban.  Can ask electrophysiology to see as an outpatient for consideration of repeat atrial flutter ablation.  3 chronic stage IIIa kidney disease-follow renal function closely with diuresis.  4 hypertension-blood pressure controlled.  Continue present medications and follow.  For questions or updates, please contact [place] Please consult [email or web address] for contact info under        Signed, Kirk Ruths, MD  07/30/2021, 7:37 AM

## 2021-07-31 LAB — BASIC METABOLIC PANEL
Anion gap: 7 (ref 5–15)
BUN: 21 mg/dL (ref 8–23)
CO2: 27 mmol/L (ref 22–32)
Calcium: 8.9 mg/dL (ref 8.9–10.3)
Chloride: 104 mmol/L (ref 98–111)
Creatinine, Ser: 1.74 mg/dL — ABNORMAL HIGH (ref 0.61–1.24)
GFR, Estimated: 44 mL/min — ABNORMAL LOW (ref >60)
Glucose, Bld: 161 mg/dL — ABNORMAL HIGH (ref 70–99)
Potassium: 3.7 mmol/L (ref 3.5–5.1)
Sodium: 138 mmol/L (ref 135–145)

## 2021-07-31 LAB — GLUCOSE, CAPILLARY
Glucose-Capillary: 203 mg/dL — ABNORMAL HIGH (ref 70–99)
Glucose-Capillary: 208 mg/dL — ABNORMAL HIGH (ref 70–99)
Glucose-Capillary: 155 mg/dL — ABNORMAL HIGH (ref 70–99)
Glucose-Capillary: 233 mg/dL — ABNORMAL HIGH (ref 70–99)

## 2021-07-31 MED ORDER — FUROSEMIDE 10 MG/ML IJ SOLN
40.0000 mg | Freq: Two times a day (BID) | INTRAMUSCULAR | Status: DC
  Administered 2021-07-31 – 2021-08-02 (×3): 40 mg via INTRAVENOUS
  Filled 2021-07-31 (×4): qty 4

## 2021-07-31 NOTE — Plan of Care (Signed)
  Problem: Education: Goal: Knowledge of General Education information will improve Description Including pain rating scale, medication(s)/side effects and non-pharmacologic comfort measures Outcome: Progressing   

## 2021-07-31 NOTE — H&P (View-Only) (Signed)
Progress Note  Patient Name: Richard Barton Date of Encounter: 07/31/2021  [facility] Cardiologist: Elouise Munroe, MD   Subjective   No CP or dyspnea  Inpatient Medications    Scheduled Meds:  amLODipine  10 mg Oral Daily   apixaban  5 mg Oral BID   benazepril  40 mg Oral Daily   empagliflozin  10 mg Oral Daily   furosemide  60 mg Intravenous BID   influenza vac split quadrivalent PF  0.5 mL Intramuscular Tomorrow-1000   insulin aspart  0-15 Units Subcutaneous TID WC   minoxidil  20 mg Oral BID   nebivolol  20 mg Oral Daily   pantoprazole  40 mg Oral Daily   pneumococcal 23 valent vaccine  0.5 mL Intramuscular Tomorrow-1000   rosuvastatin  10 mg Oral Daily   tamsulosin  0.4 mg Oral Daily   Continuous Infusions:  PRN Meds: acetaminophen, nitroGLYCERIN, ondansetron (ZOFRAN) IV   Vital Signs    Vitals:   07/30/21 1106 07/30/21 1716 07/30/21 1936 07/31/21 0319  BP: 129/76 129/71 126/67 113/75  Pulse: (!) 59 60 (!) 52 62  Resp:   14 14  Temp:   98.5 F (36.9 C) 97.6 F (36.4 C)  TempSrc:   Oral Oral  SpO2: 97%  100% 96%  Weight:    114.1 kg  Height:        Intake/Output Summary (Last 24 hours) at 07/31/2021 0955 Last data filed at 07/31/2021 0800 Gross per 24 hour  Intake 2202 ml  Output 4925 ml  Net -2723 ml    Last 3 Weights 07/31/2021 07/30/2021 07/29/2021  Weight (lbs) 251 lb 9.6 oz 255 lb 3.2 oz 258 lb 11.2 oz  Weight (kg) 114.125 kg 115.758 kg 117.346 kg      Telemetry    Atrial flutter - Personally Reviewed  Physical Exam   GEN: NAD, WD, WN Neck: supple Cardiac: irregular Respiratory: CTA; no wheeze GI: Soft, NT/ND, no masses MS: trace edema Neuro:  no focal findings Psych: Normal affect   Labs     Chemistry Recent Labs  Lab 07/29/21 0339 07/30/21 0324 07/31/21 0212  NA 137 139 138  K 3.7 3.8 3.7  CL 102 101 104  CO2 '27 27 27  '$ GLUCOSE 149* 218* 161*  BUN '19 23 21  '$ CREATININE 1.76* 1.82* 1.74*  CALCIUM 8.7* 9.0 8.9   GFRNONAA 43* 41* 44*  ANIONGAP '8 11 7      '$ Hematology Recent Labs  Lab 07/28/21 1721 07/29/21 0339  WBC 4.9 4.2  RBC 5.79 5.58  HGB 15.5 14.9  HCT 45.6 43.7  MCV 78.8* 78.3*  MCH 26.8 26.7  MCHC 34.0 34.1  RDW 15.6* 15.6*  PLT 218 221     BNP Recent Labs  Lab 07/28/21 1721  BNP 111.2*     Patient Profile     65 year old male with past medical history of left atrial myxoma resection, history of atrial flutter status post cardioversion, hypertension, hyperlipidemia, diabetes mellitus, chronic stage IIIa kidney disease admitted with acute diastolic congestive heart failure and recurrent atrial flutter.  Echocardiogram shows normal LV function, moderate left ventricular hypertrophy, mild RV dysfunction.  Assessment & Plan    1 acute on chronic diastolic congestive heart failure-I/O-3243; wt 114.1 kg.  Patient continues to improve.  We will decrease Lasix to 40 mg IV twice daily.  Follow-up echocardiogram showed normal LV function.  2 atrial flutter- Continue apixaban.  This is likely the primary reason for his  congestive heart failure exacerbation.  Plan to proceed with cardioversion tomorrow as he has missed no doses of apixaban.  Can ask electrophysiology to see as an outpatient for consideration of repeat atrial flutter ablation.  We will hold Bystolic today and tomorrow in anticipation of cardioversion.  Resume following procedure pending heart rate.  3 chronic stage IIIa kidney disease-follow renal function closely with diuresis.  4 hypertension-blood pressure controlled.  Continue present medications and follow.  For questions or updates, please contact [place] Please consult [email or web address] for contact info under        Signed, Kirk Ruths, MD  07/31/2021, 9:55 AM

## 2021-07-31 NOTE — Progress Notes (Signed)
Progress Note  Patient Name: Richard Barton Date of Encounter: 07/31/2021  [facility] Cardiologist: Elouise Munroe, MD   Subjective   No CP or dyspnea  Inpatient Medications    Scheduled Meds:  amLODipine  10 mg Oral Daily   apixaban  5 mg Oral BID   benazepril  40 mg Oral Daily   empagliflozin  10 mg Oral Daily   furosemide  60 mg Intravenous BID   influenza vac split quadrivalent PF  0.5 mL Intramuscular Tomorrow-1000   insulin aspart  0-15 Units Subcutaneous TID WC   minoxidil  20 mg Oral BID   nebivolol  20 mg Oral Daily   pantoprazole  40 mg Oral Daily   pneumococcal 23 valent vaccine  0.5 mL Intramuscular Tomorrow-1000   rosuvastatin  10 mg Oral Daily   tamsulosin  0.4 mg Oral Daily   Continuous Infusions:  PRN Meds: acetaminophen, nitroGLYCERIN, ondansetron (ZOFRAN) IV   Vital Signs    Vitals:   07/30/21 1106 07/30/21 1716 07/30/21 1936 07/31/21 0319  BP: 129/76 129/71 126/67 113/75  Pulse: (!) 59 60 (!) 52 62  Resp:   14 14  Temp:   98.5 F (36.9 C) 97.6 F (36.4 C)  TempSrc:   Oral Oral  SpO2: 97%  100% 96%  Weight:    114.1 kg  Height:        Intake/Output Summary (Last 24 hours) at 07/31/2021 0955 Last data filed at 07/31/2021 0800 Gross per 24 hour  Intake 2202 ml  Output 4925 ml  Net -2723 ml    Last 3 Weights 07/31/2021 07/30/2021 07/29/2021  Weight (lbs) 251 lb 9.6 oz 255 lb 3.2 oz 258 lb 11.2 oz  Weight (kg) 114.125 kg 115.758 kg 117.346 kg      Telemetry    Atrial flutter - Personally Reviewed  Physical Exam   GEN: NAD, WD, WN Neck: supple Cardiac: irregular Respiratory: CTA; no wheeze GI: Soft, NT/ND, no masses MS: trace edema Neuro:  no focal findings Psych: Normal affect   Labs     Chemistry Recent Labs  Lab 07/29/21 0339 07/30/21 0324 07/31/21 0212  NA 137 139 138  K 3.7 3.8 3.7  CL 102 101 104  CO2 '27 27 27  '$ GLUCOSE 149* 218* 161*  BUN '19 23 21  '$ CREATININE 1.76* 1.82* 1.74*  CALCIUM 8.7* 9.0 8.9   GFRNONAA 43* 41* 44*  ANIONGAP '8 11 7      '$ Hematology Recent Labs  Lab 07/28/21 1721 07/29/21 0339  WBC 4.9 4.2  RBC 5.79 5.58  HGB 15.5 14.9  HCT 45.6 43.7  MCV 78.8* 78.3*  MCH 26.8 26.7  MCHC 34.0 34.1  RDW 15.6* 15.6*  PLT 218 221     BNP Recent Labs  Lab 07/28/21 1721  BNP 111.2*     Patient Profile     65 year old male with past medical history of left atrial myxoma resection, history of atrial flutter status post cardioversion, hypertension, hyperlipidemia, diabetes mellitus, chronic stage IIIa kidney disease admitted with acute diastolic congestive heart failure and recurrent atrial flutter.  Echocardiogram shows normal LV function, moderate left ventricular hypertrophy, mild RV dysfunction.  Assessment & Plan    1 acute on chronic diastolic congestive heart failure-I/O-3243; wt 114.1 kg.  Patient continues to improve.  We will decrease Lasix to 40 mg IV twice daily.  Follow-up echocardiogram showed normal LV function.  2 atrial flutter- Continue apixaban.  This is likely the primary reason for his  congestive heart failure exacerbation.  Plan to proceed with cardioversion tomorrow as he has missed no doses of apixaban.  Can ask electrophysiology to see as an outpatient for consideration of repeat atrial flutter ablation.  We will hold Bystolic today and tomorrow in anticipation of cardioversion.  Resume following procedure pending heart rate.  3 chronic stage IIIa kidney disease-follow renal function closely with diuresis.  4 hypertension-blood pressure controlled.  Continue present medications and follow.  For questions or updates, please contact [place] Please consult [email or web address] for contact info under        Signed, Kirk Ruths, MD  07/31/2021, 9:55 AM

## 2021-08-01 ENCOUNTER — Inpatient Hospital Stay (HOSPITAL_COMMUNITY): Payer: 59 | Admitting: Anesthesiology

## 2021-08-01 ENCOUNTER — Encounter (HOSPITAL_COMMUNITY)
Admission: AD | Disposition: A | Discharge status: Home / Self Care | Payer: Self-pay | Source: Ambulatory Visit | Attending: Cardiology

## 2021-08-01 ENCOUNTER — Encounter (HOSPITAL_COMMUNITY): Payer: Self-pay | Admitting: Cardiology

## 2021-08-01 DIAGNOSIS — I480 Paroxysmal atrial fibrillation: Secondary | ICD-10-CM

## 2021-08-01 HISTORY — PX: CARDIOVERSION: SHX1299

## 2021-08-01 LAB — BASIC METABOLIC PANEL
Anion gap: 7 (ref 5–15)
BUN: 22 mg/dL (ref 8–23)
CO2: 29 mmol/L (ref 22–32)
Calcium: 8.9 mg/dL (ref 8.9–10.3)
Chloride: 104 mmol/L (ref 98–111)
Creatinine, Ser: 1.84 mg/dL — ABNORMAL HIGH (ref 0.61–1.24)
GFR, Estimated: 41 mL/min — ABNORMAL LOW (ref >60)
Glucose, Bld: 173 mg/dL — ABNORMAL HIGH (ref 70–99)
Potassium: 3.7 mmol/L (ref 3.5–5.1)
Sodium: 140 mmol/L (ref 135–145)

## 2021-08-01 LAB — GLUCOSE, CAPILLARY
Glucose-Capillary: 202 mg/dL — ABNORMAL HIGH (ref 70–99)
Glucose-Capillary: 262 mg/dL — ABNORMAL HIGH (ref 70–99)
Glucose-Capillary: 166 mg/dL — ABNORMAL HIGH (ref 70–99)
Glucose-Capillary: 206 mg/dL — ABNORMAL HIGH (ref 70–99)

## 2021-08-01 LAB — PROTIME-INR
INR: 1.3 — ABNORMAL HIGH (ref 0.8–1.2)
Prothrombin Time: 16.2 seconds — ABNORMAL HIGH (ref 11.4–15.2)

## 2021-08-01 SURGERY — CARDIOVERSION
Anesthesia: General

## 2021-08-01 MED ORDER — POTASSIUM CHLORIDE CRYS ER 20 MEQ PO TBCR
20.0000 meq | EXTENDED_RELEASE_TABLET | Freq: Once | ORAL | Status: AC
  Administered 2021-08-01: 20 meq via ORAL
  Filled 2021-08-01: qty 1

## 2021-08-01 MED ORDER — PROPOFOL 10 MG/ML IV BOLUS
INTRAVENOUS | Status: DC | PRN
  Administered 2021-08-01: 80 mg via INTRAVENOUS

## 2021-08-01 MED ORDER — NEBIVOLOL HCL 10 MG PO TABS
20.0000 mg | ORAL_TABLET | Freq: Every day | ORAL | Status: DC
  Administered 2021-08-01: 20 mg via ORAL
  Filled 2021-08-01 (×2): qty 2

## 2021-08-01 MED ORDER — LIDOCAINE 2% (20 MG/ML) 5 ML SYRINGE
INTRAMUSCULAR | Status: DC | PRN
  Administered 2021-08-01: 20 mg via INTRAVENOUS

## 2021-08-01 MED ORDER — INSULIN GLARGINE-YFGN 100 UNIT/ML ~~LOC~~ SOLN
35.0000 [IU] | Freq: Two times a day (BID) | SUBCUTANEOUS | Status: DC
  Administered 2021-08-01 – 2021-08-02 (×3): 35 [IU] via SUBCUTANEOUS
  Filled 2021-08-01 (×5): qty 0.35

## 2021-08-01 MED ORDER — SODIUM CHLORIDE 0.9 % IV SOLN: INTRAVENOUS | Status: DC | PRN

## 2021-08-01 NOTE — Anesthesia Postprocedure Evaluation (Signed)
Anesthesia Post Note  Patient: Richard Barton  Procedure(s) Performed: CARDIOVERSION     Patient location during evaluation: PACU Anesthesia Type: General Level of consciousness: awake and alert Pain management: pain level controlled Vital Signs Assessment: post-procedure vital signs reviewed and stable Respiratory status: spontaneous breathing, nonlabored ventilation, respiratory function stable and patient connected to nasal cannula oxygen Cardiovascular status: blood pressure returned to baseline and stable Postop Assessment: no apparent nausea or vomiting Anesthetic complications: no   No notable events documented.  Last Vitals:  Vitals:   08/01/21 0920 08/01/21 1103  BP: (!) 145/70 111/65  Pulse: 70 73  Resp: 18 20  Temp:  (!) 36.3 C  SpO2: 98% 93%    Last Pain:  Vitals:   08/01/21 1103  TempSrc: Oral  PainSc:                  Tiajuana Amass

## 2021-08-01 NOTE — Interval H&P Note (Signed)
History and Physical Interval Note:  08/01/2021 8:19 AM  Richard Barton  has presented today for surgery, with the diagnosis of atial fibrillation.  The various methods of treatment have been discussed with the patient and family. After consideration of risks, benefits and other options for treatment, the patient has consented to  Procedure(s): CARDIOVERSION (N/A) as a surgical intervention.  The patient's history has been reviewed, patient examined, no change in status, stable for surgery.  I have reviewed the patient's chart and labs.  Questions were answered to the patient's satisfaction.     Kirk Ruths

## 2021-08-01 NOTE — Progress Notes (Addendum)
Progress Note  Patient Name: Richard Barton Date of Encounter: 08/01/2021  [facility] Cardiologist: Elouise Munroe, MD   Subjective   S/p DCCV this morning. Feeling okay. Had a little swelling in his legs today. Did not get IV lasix this morning.   Inpatient Medications    Scheduled Meds:  amLODipine  10 mg Oral Daily   apixaban  5 mg Oral BID   benazepril  40 mg Oral Daily   empagliflozin  10 mg Oral Daily   furosemide  40 mg Intravenous BID   influenza vac split quadrivalent PF  0.5 mL Intramuscular Tomorrow-1000   insulin aspart  0-15 Units Subcutaneous TID WC   insulin glargine-yfgn  35 Units Subcutaneous BID   minoxidil  20 mg Oral BID   nebivolol  20 mg Oral Daily   pantoprazole  40 mg Oral Daily   pneumococcal 23 valent vaccine  0.5 mL Intramuscular Tomorrow-1000   potassium chloride  20 mEq Oral Once   rosuvastatin  10 mg Oral Daily   tamsulosin  0.4 mg Oral Daily   Continuous Infusions:  PRN Meds: acetaminophen, nitroGLYCERIN, ondansetron (ZOFRAN) IV   Vital Signs    Vitals:   08/01/21 0910 08/01/21 0920 08/01/21 1103 08/01/21 1148  BP: 127/67 (!) 145/70 111/65 127/62  Pulse: 70 70 73 73  Resp: (!) '23 18 20   '$ Temp:   (!) 97.4 F (36.3 C)   TempSrc:   Oral   SpO2: 97% 98% 93%   Weight:      Height:        Intake/Output Summary (Last 24 hours) at 08/01/2021 1459 Last data filed at 08/01/2021 1404 Gross per 24 hour  Intake 1917 ml  Output 1800 ml  Net 117 ml   Last 3 Weights 08/01/2021 08/01/2021 07/31/2021  Weight (lbs) 250 lb 250 lb 11.2 oz 251 lb 9.6 oz  Weight (kg) 113.399 kg 113.717 kg 114.125 kg      Telemetry    Atrial flutter > NSR following DCCV. 4 second pause around 2pm, occasional PACs - Personally Reviewed  ECG    Sinus rhythm, rate 68 bpm, 1st degree AV block, chronic inferolateral T wave inversions.  - Personally Reviewed  Physical Exam   GEN: No acute distress.   Neck: No JVD Cardiac: RRR, no murmurs, rubs, or  gallops.  Respiratory: Clear to auscultation bilaterally. GI: Soft, nontender, non-distended  MS: 1+ LE edema; No deformity. Neuro:  Nonfocal  Psych: Normal affect   Labs    High Sensitivity Troponin:  No results for input(s): TROPONINIHS in the last 720 hours.    Chemistry Recent Labs  Lab 07/30/21 0324 07/31/21 0212 08/01/21 0546  NA 139 138 140  K 3.8 3.7 3.7  CL 101 104 104  CO2 '27 27 29  '$ GLUCOSE 218* 161* 173*  BUN '23 21 22  '$ CREATININE 1.82* 1.74* 1.84*  CALCIUM 9.0 8.9 8.9  GFRNONAA 41* 44* 41*  ANIONGAP '11 7 7     '$ Hematology Recent Labs  Lab 07/28/21 1721 07/29/21 0339  WBC 4.9 4.2  RBC 5.79 5.58  HGB 15.5 14.9  HCT 45.6 43.7  MCV 78.8* 78.3*  MCH 26.8 26.7  MCHC 34.0 34.1  RDW 15.6* 15.6*  PLT 218 221    BNP Recent Labs  Lab 07/28/21 1721  BNP 111.2*     DDimer No results for input(s): DDIMER in the last 168 hours.   Radiology    No results found.  Cardiac Studies  Echocardiogram 07/29/21: 1. Left ventricular ejection fraction, by estimation, is 50 to 55%. The  left ventricle has low normal function. The left ventricle has no regional  wall motion abnormalities. There is moderate left ventricular hypertrophy.  Left ventricular diastolic  parameters are indeterminate.   2. Right ventricular systolic function is mildly reduced. The right  ventricular size is normal. There is normal pulmonary artery systolic  pressure.   3. The mitral valve is normal in structure. Trivial mitral valve  regurgitation.   4. The aortic valve was not well visualized. Aortic valve regurgitation  is not visualized. No aortic stenosis is present.   5. The inferior vena cava is dilated in size with >50% respiratory  variability, suggesting right atrial pressure of 8 mmHg.   Patient Profile     65 year old male with past medical history of left atrial myxoma resection, history of atrial flutter status post cardioversion, hypertension, hyperlipidemia, diabetes  mellitus, chronic stage IIIa kidney disease admitted with acute diastolic congestive heart failure and recurrent atrial flutter.   Assessment & Plan    1. Paroxysmal atrial flutter: patient presented with volume overload and atrial flutter. Echo this admission showed EF 50-55%, no RWMA, moderate LVH, indeterminate LV diastolic function, mildly reduced RV systolic function, and no significant valvular abnormalities. He was continue on eliquis '5mg'$  BID for stroke ppx and home nebivolol continued for rate control. He underwent successful DCCV 08/01/21 with conversion to sinus rhythm with 1st degree AV block. Had a 4 second pause around 2pm today following DCCV - asymptomatic.  - Continue nebivolol for rate control - Continue eliquis for stoke ppx - recommend uninterrupted therapy for 4 weeks, after which he can hold for elective procedures if necessary.  2. Acute on chronic CHF with mild RV dysfunction: he was volume overloaded on admission. Diuresed with IV lasix with UOP net -13.5L this admission and -1.7L in the past 24 hours. Weight is down to 250lbs today from 262lbs on admission. He did not receive IV lasix this morning prior to DCCV. He has 1+ LE edema on exam - Favor IV lasix this evening then likely transition to po and  discharge home tomorrow - Continue Bblocker and benazepril - Continue jardiance - Continue to monitor strict I&O and daily weights  3. HTN: BP historically difficult to control. Overall stable in the past 24 hours - Continue amlodipine, nebivolol, benazepril, and minoxidil   4. HLD: LDL 48 this admission - Continue crestor  5. DM type 2: A1C 6.7 this admission - Continue jardiance  - Continue insulin and ISS  6. CKD stage 3: Cr 1.84 today from 1.74 yesterday, though within baseline range.  - Continue to monitor closely   For questions or updates, please contact [place] Please consult [email or web address] for contact info under        Signed, Abigail Butts,  PA-C  08/01/2021, 2:59 PM    Patient examined chart reviewed Discussed care with patient and wife. Maintaining NSR post [place]  mild LE edema To get lasix tonight Monitor overnight for maintenance of NSR D/c in am if stable   Jenkins Rouge MD [facility]

## 2021-08-01 NOTE — Anesthesia Procedure Notes (Signed)
Procedure Name: General with mask airway Date/Time: 08/01/2021 8:35 AM Performed by: Jenne Campus, CRNA Pre-anesthesia Checklist: Emergency Drugs available, Suction available, Patient being monitored and Patient identified Patient Re-evaluated:Patient Re-evaluated prior to induction Oxygen Delivery Method: Ambu bag

## 2021-08-01 NOTE — Transfer of Care (Signed)
Immediate Anesthesia Transfer of Care Note  Patient: Richard Barton  Procedure(s) Performed: CARDIOVERSION  Patient Location: Endoscopy Unit  Anesthesia Type:General  Level of Consciousness: awake, oriented and patient cooperative  Airway & Oxygen Therapy: Patient Spontanous Breathing and Patient connected to nasal cannula oxygen  Post-op Assessment: Report given to RN and Post -op Vital signs reviewed and stable  Post vital signs: Reviewed  Last Vitals:  Vitals Value Taken Time  BP 141/69 08/01/21 0847  Temp    Pulse 67 08/01/21 0847  Resp 16 08/01/21 0847  SpO2 97 % 08/01/21 0847    Last Pain:  Vitals:   08/01/21 0847  TempSrc:   PainSc: 0-No pain         Complications: No notable events documented.

## 2021-08-01 NOTE — Procedures (Addendum)
Electrical Cardioversion Procedure Note Richard Barton [id number] February 07, 1956  Procedure: Electrical Cardioversion Indications:  Atrial Flutter  Procedure Details Consent: Risks of procedure as well as the alternatives and risks of each were explained to the (patient/caregiver).  Consent for procedure obtained. Time Out: Verified patient identification, verified procedure, site/side was marked, verified correct patient position, special equipment/implants available, medications/allergies/relevent history reviewed, required imaging and test results available.  Performed  Patient placed on cardiac monitor, pulse oximetry, supplemental oxygen as necessary.  Sedation given:  Pt sedated by anesthesia with lidocaine 20 mg and diprovan 80 mg IV. Pacer pads placed anterior and posterior chest.  Cardioverted 1 time(s).  Cardioverted at [place].  Evaluation Findings: Post procedure EKG shows: NSR Complications: None Patient did tolerate procedure well.  Will resume bystolic 20 mg daily.   Kirk Ruths 08/01/2021, 7:30 AM

## 2021-08-01 NOTE — Anesthesia Preprocedure Evaluation (Signed)
Anesthesia Evaluation  Patient identified by MRN, date of birth, ID band Patient awake    Reviewed: Allergy & Precautions, NPO status , Patient's Chart, lab work & pertinent test results  Airway Mallampati: II  TM Distance: >3 FB     Dental  (+) Dental Advisory Given   Pulmonary former smoker,    breath sounds clear to auscultation       Cardiovascular hypertension, Pt. on medications and Pt. on home beta blockers +CHF  + dysrhythmias Atrial Fibrillation  Rhythm:Irregular Rate:Normal     Neuro/Psych  Neuromuscular disease    GI/Hepatic negative GI ROS, Neg liver ROS,   Endo/Other  diabetes, Type 2, Insulin Dependent  Renal/GU Renal disease     Musculoskeletal   Abdominal   Peds  Hematology negative hematology ROS (+)   Anesthesia Other Findings   Reproductive/Obstetrics                             Anesthesia Physical Anesthesia Plan  ASA: 3  Anesthesia Plan: General   Post-op Pain Management:    Induction: Intravenous  PONV Risk Score and Plan: 2 and Treatment may vary due to age or medical condition  Airway Management Planned: Natural Airway and Mask  Additional Equipment:   Intra-op Plan:   Post-operative Plan:   Informed Consent: I have reviewed the patients History and Physical, chart, labs and discussed the procedure including the risks, benefits and alternatives for the proposed anesthesia with the patient or authorized representative who has indicated his/her understanding and acceptance.       Plan Discussed with: CRNA  Anesthesia Plan Comments:         Anesthesia Quick Evaluation

## 2021-08-01 NOTE — Plan of Care (Signed)
?  Problem: Clinical Measurements: ?Goal: Respiratory complications will improve ?Outcome: Progressing ?  ?Problem: Elimination: ?Goal: Will not experience complications related to urinary retention ?Outcome: Progressing ?  ?

## 2021-08-01 NOTE — Plan of Care (Signed)
  Problem: Clinical Measurements: Goal: Diagnostic test results will improve Outcome: Progressing Goal: Respiratory complications will improve Outcome: Progressing   Problem: Elimination: Goal: Will not experience complications related to urinary retention Outcome: Progressing   

## 2021-08-02 ENCOUNTER — Other Ambulatory Visit (HOSPITAL_COMMUNITY): Payer: Self-pay

## 2021-08-02 ENCOUNTER — Encounter (HOSPITAL_COMMUNITY): Payer: Self-pay | Admitting: Cardiology

## 2021-08-02 DIAGNOSIS — E785 Hyperlipidemia, unspecified: Secondary | ICD-10-CM

## 2021-08-02 DIAGNOSIS — G4733 Obstructive sleep apnea (adult) (pediatric): Secondary | ICD-10-CM

## 2021-08-02 DIAGNOSIS — N183 Chronic kidney disease, stage 3 unspecified: Secondary | ICD-10-CM

## 2021-08-02 DIAGNOSIS — I483 Typical atrial flutter: Secondary | ICD-10-CM

## 2021-08-02 DIAGNOSIS — I4892 Unspecified atrial flutter: Secondary | ICD-10-CM

## 2021-08-02 DIAGNOSIS — I10 Essential (primary) hypertension: Secondary | ICD-10-CM

## 2021-08-02 LAB — GLUCOSE, CAPILLARY
Glucose-Capillary: 184 mg/dL — ABNORMAL HIGH (ref 70–99)
Glucose-Capillary: 198 mg/dL — ABNORMAL HIGH (ref 70–99)

## 2021-08-02 MED ORDER — EMPAGLIFLOZIN 10 MG PO TABS
10.0000 mg | ORAL_TABLET | Freq: Every day | ORAL | 6 refills | Status: DC
  Filled 2021-08-02: qty 30, 30d supply, fill #0

## 2021-08-02 MED ORDER — NEBIVOLOL HCL 10 MG PO TABS: 10.0000 mg | ORAL_TABLET | Freq: Every day | ORAL | Status: DC

## 2021-08-02 MED ORDER — NEBIVOLOL HCL 10 MG PO TABS
10.0000 mg | ORAL_TABLET | Freq: Every day | ORAL | 3 refills | Status: DC
  Filled 2021-08-02: qty 30, 30d supply, fill #0

## 2021-08-02 NOTE — Progress Notes (Signed)
PT. HR 26 while alsleep, non-sustained. On call for Cardiology paged to make aware.

## 2021-08-02 NOTE — Progress Notes (Signed)
Progress Note  Patient Name: Richard Barton Date of Encounter: 08/02/2021  [facility] Cardiologist: Elouise Munroe, MD   Subjective   Pt is doing well  He has symptoms c/w sleep apnea His bradycardia seems to be related to his sleeping  No syncope    Inpatient Medications    Scheduled Meds:  amLODipine  10 mg Oral Daily   apixaban  5 mg Oral BID   benazepril  40 mg Oral Daily   empagliflozin  10 mg Oral Daily   furosemide  40 mg Intravenous BID   influenza vac split quadrivalent PF  0.5 mL Intramuscular Tomorrow-1000   insulin aspart  0-15 Units Subcutaneous TID WC   insulin glargine-yfgn  35 Units Subcutaneous BID   minoxidil  20 mg Oral BID   nebivolol  20 mg Oral Daily   pantoprazole  40 mg Oral Daily   pneumococcal 23 valent vaccine  0.5 mL Intramuscular Tomorrow-1000   rosuvastatin  10 mg Oral Daily   tamsulosin  0.4 mg Oral Daily   Continuous Infusions:  PRN Meds: acetaminophen, nitroGLYCERIN, ondansetron (ZOFRAN) IV   Vital Signs    Vitals:   08/02/21 0538 08/02/21 0826 08/02/21 1012 08/02/21 1135  BP: 132/67 136/86 134/69 (!) 122/56  Pulse: 64 63  66  Resp: '18 16  18  '$ Temp: 98.1 F (36.7 C)   98.4 F (36.9 C)  TempSrc: Oral   Oral  SpO2: 94% 97%  95%  Weight: 114.5 kg     Height:        Intake/Output Summary (Last 24 hours) at 08/02/2021 1248 Last data filed at 08/02/2021 1232 Gross per 24 hour  Intake 2454 ml  Output 2000 ml  Net 454 ml   Last 3 Weights 08/02/2021 08/01/2021 08/01/2021  Weight (lbs) 252 lb 6.4 oz 250 lb 250 lb 11.2 oz  Weight (kg) 114.488 kg 113.399 kg 113.717 kg      Telemetry    NSR ,  occasional brady associated with sleeping  - he has symptoms of OSA  - Personally Reviewed  ECG     - Personally Reviewed  Physical Exam   GEN:  middle age man,,  NAD   Neck: No JVD Cardiac: RRR, no murmurs, rubs, or gallops.  Respiratory: Clear to auscultation bilaterally. GI: Soft, nontender, non-distended  MS: No edema;  No deformity. Neuro:  Nonfocal  Psych: Normal affect   Labs    High Sensitivity Troponin:  No results for input(s): TROPONINIHS in the last 720 hours.    Chemistry Recent Labs  Lab 07/30/21 0324 07/31/21 0212 08/01/21 0546  NA 139 138 140  K 3.8 3.7 3.7  CL 101 104 104  CO2 '27 27 29  '$ GLUCOSE 218* 161* 173*  BUN '23 21 22  '$ CREATININE 1.82* 1.74* 1.84*  CALCIUM 9.0 8.9 8.9  GFRNONAA 41* 44* 41*  ANIONGAP '11 7 7     '$ Hematology Recent Labs  Lab 07/28/21 1721 07/29/21 0339  WBC 4.9 4.2  RBC 5.79 5.58  HGB 15.5 14.9  HCT 45.6 43.7  MCV 78.8* 78.3*  MCH 26.8 26.7  MCHC 34.0 34.1  RDW 15.6* 15.6*  PLT 218 221    BNP Recent Labs  Lab 07/28/21 1721  BNP 111.2*     DDimer No results for input(s): DDIMER in the last 168 hours.   Radiology    No results found.  Cardiac Studies      Patient Profile     65 y.o.  male    Assessment & Plan    1.  Paroxysmal atrial flutter: He is status post cardioversion.  He has maintained sinus rhythm.  He has had some episodes of bradycardia and these typically occur when he is sleeping.  We will reduce his Bystolic to 10 mg a day.  Continue Eliquis.  He will follow-up with Dr. Margaretann Loveless  in the office in several weeks.  2.  Hypertension: His blood pressure is typically been fairly difficult to control.  I suspect this is been because of his obstructive sleep apnea.  He is on numerous medications.  He will follow-up with Dr. Margaretann Loveless .   3.  CKD:   stable    For questions or updates, please contact [place] Please consult [email or web address] for contact info under        Signed, Mertie Moores, MD  08/02/2021, 12:48 PM

## 2021-08-02 NOTE — Discharge Instructions (Addendum)
Heart Failure Education: Weigh yourself EVERY morning after you go to the bathroom but before you eat or drink anything. Write this number down in a weight log/diary. If you gain 3 pounds overnight or 5 pounds in a week, call the office. Take your medicines as prescribed. If you have concerns about your medications, please call us before you stop taking them.  Eat low salt foods--Limit salt (sodium) to 2000 mg per day. This will help prevent your body from holding onto fluid. Read food labels as many processed foods have a lot of sodium, especially canned goods and prepackaged meats. If you would like some assistance choosing low sodium foods, we would be happy to set you up with a nutritionist. Stay as active as you can everyday. Staying active will give you more energy and make your muscles stronger. Start with 5 minutes at a time and work your way up to 30 minutes a day. Break up your activities--do some in the morning and some in the afternoon. Start with 3 days per week and work your way up to 5 days as you can.  If you have chest pain, feel short of breath, dizzy, or lightheaded, STOP. If you don't feel better after a short rest, call 911. If you do feel better, call the office to let us know you have symptoms with exercise. Limit all fluids for the day to less than 2 liters. Fluid includes all drinks, coffee, juice, ice chips, soup, jello, and all other liquids.   You can take additional '40mg'$  of lasix if you notice weight gain of 3lb overnight or 5lbs in 1 week. Please notify the office if you need to do this more than 2 times in one week so we can monitor your kidney function and electrolytes appropriately  Please remove one of your Bystolic pills from your pill pack to ensure you are only taking '10mg'$  daily. Please remove the invokana from your pill packs as this medication is being replaced by Jardiance.

## 2021-08-02 NOTE — Progress Notes (Signed)
PT. HR down to 39-40 nonsustained with a 2.85 second pause. HR sustaining in the 70s. Pt. Resting in bed. On call for Cardiology paged to make aware.

## 2021-08-02 NOTE — Discharge Summary (Addendum)
Discharge Summary    Patient ID: Richard Barton MRN: [id number]; DOB: December 05, 1955  Admit date: 07/28/2021 Discharge date: 08/02/2021  PCP:  Haywood Pao, MD   [facility] Providers Cardiologist:  Elouise Munroe, MD      Discharge Diagnoses    Principal Problem:   Acute on chronic diastolic CHF (congestive heart failure), NYHA class 3 ([place]) Active Problems:   DM type 2, goal HbA1c < 7% (HCC)   Atrial flutter (HCC)   Benign essential HTN   Hyperlipidemia   CKD (chronic kidney disease) stage 3, GFR 30-59 ml/min ([place])    Diagnostic Studies/Procedures    Echocardiogram 07/29/21: 1. Left ventricular ejection fraction, by estimation, is 50 to 55%. The  left ventricle has low normal function. The left ventricle has no regional  wall motion abnormalities. There is moderate left ventricular hypertrophy.  Left ventricular diastolic  parameters are indeterminate.   2. Right ventricular systolic function is mildly reduced. The right  ventricular size is normal. There is normal pulmonary artery systolic  pressure.   3. The mitral valve is normal in structure. Trivial mitral valve  regurgitation.   4. The aortic valve was not well visualized. Aortic valve regurgitation  is not visualized. No aortic stenosis is present.   5. The inferior vena cava is dilated in size with >50% respiratory  variability, suggesting right atrial pressure of 8 mmHg.  _____________   History of Present Illness     Richard Barton is a 65 y.o. male with  a hx of left atrial myxoma with resection in 2012, A-flutter with cardioversion 2012, HTN, HLD, severe LVH, DM and CKD who is being seen 07/28/2021 for the evaluation of SOB and weight gain. He is directly admitted from clinic.    He saw Coletta Memos in clinic on July 04, 2021 and noticed increased shortness of breath x1 month.  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  Patient noticed dietary indiscretion.  EKG  revealed atrial flutter with rate of 67 bpm.  He was started on Eliquis 5 mg twice daily.   He thinks he likely had a coronary evaluation before his left atrial myxoma resection in 2012. He has diabetes, obesity, family history of heart disease with his father having MI young and a history of tobacco use.  He has a 60 to 90 pack year but stopped smoking 10 years ago.  [name] seen in clinic today by Caron Presume for follow up. Noted constant central chest tightness with associated tired shoulders, heartburn and persistent fluid retention. Seen by PCP few week ago for weight gain and dyspnea. Increased lasix with weight loss but still above baseline. Sleeping on recliner.    The patient was directly admitted for diuresis and then DCCV when stable.    Unfortunately, Patient hasn't took any of his medications today. Last dose of Eliquis 9/7 @ [facility]     Consultants: None   1. Paroxysmal atrial flutter: patient presented with volume overload and atrial flutter. Echo this admission showed EF 50-55%, no RWMA, moderate LVH, indeterminate LV diastolic function, mildly reduced RV systolic function, and no significant valvular abnormalities. He was continue on eliquis '5mg'$  BID for stroke ppx and home nebivolol continued for rate control. He underwent successful DCCV 08/01/21 with conversion to sinus rhythm with 1st degree AV block. Had an asymptomatic 4 second pause while awake and bradycardia while sleeping following his cardioversion. Nebivolol decreased to  $'10mg'D$  daily prior to discharge.   - Continue nebivolol for rate control - Continue eliquis for stoke ppx - recommend uninterrupted therapy for 4 weeks, after which he can hold for elective procedures if necessary.   2. Acute on chronic CHF with mild RV dysfunction: he was volume overloaded on admission. Echo showed EF 50-55%, no RWMA, moderate LVH, mildly reduced RV SF, and no significant valvular abnormalities. Diuresed with IV lasix  with UOP net -13.2L this admission. Weight is down to 252lbs on the day of discharge from 262lbs on admission.  - Discharged home on lasix '40mg'$  daily with instructions to take additional '40mg'$  for weight gain of 3lbs overnight or 5lbs in 1 week - Continue Bblocker and benazepril - Continue jardiance - Continue to monitor strict I&O and daily weights   3. HTN: BP historically difficult to control. Overall stable in the past 24 hours - Continue amlodipine, nebivolol, benazepril, and minoxidil    4. HLD: LDL 48 this admission - Continue crestor   5. DM type 2: A1C 6.7 this admission - Continue jardiance  - Continue insulin and ISS   6. CKD stage 3b: Cr 1.84 08/01/21 from 1.74 the day prior, though within baseline range.  - Continue to monitor routinely outpatient  7. Suspected OSA: with obesity and atrial flutter, would likely benefit from sleep study to evaluate for OSA - Will need to have sleep study arranged at follow-up visit.   Did the patient have an acute coronary syndrome (MI, NSTEMI, STEMI, etc) this admission?:  No                               Did the patient have a percutaneous coronary intervention (stent / angioplasty)?:  No.       _____________  Discharge Vitals Blood pressure (!) 122/56, pulse 66, temperature 98.4 F (36.9 C), temperature source Oral, resp. rate 18, height 5' 11.5" (1.816 m), weight 114.5 kg, SpO2 95 %.  Filed Weights   08/01/21 0609 08/01/21 0755 08/02/21 0538  Weight: 113.7 kg 113.4 kg 114.5 kg    Labs & Radiologic Studies    CBC No results for input(s): WBC, NEUTROABS, HGB, HCT, MCV, PLT in the last 72 hours. Basic Metabolic Panel Recent Labs    07/31/21 0212 08/01/21 0546  NA 138 140  K 3.7 3.7  CL 104 104  CO2 27 29  GLUCOSE 161* 173*  BUN 21 22  CREATININE 1.74* 1.84*  CALCIUM 8.9 8.9   Liver Function Tests No results for input(s): AST, ALT, ALKPHOS, BILITOT, PROT, ALBUMIN in the last 72 hours. No results for input(s): LIPASE,  AMYLASE in the last 72 hours. High Sensitivity Troponin:   No results for input(s): TROPONINIHS in the last 720 hours.  BNP Invalid input(s): POCBNP D-Dimer No results for input(s): DDIMER in the last 72 hours. Hemoglobin A1C No results for input(s): HGBA1C in the last 72 hours. Fasting Lipid Panel No results for input(s): CHOL, HDL, LDLCALC, TRIG, CHOLHDL, LDLDIRECT in the last 72 hours. Thyroid Function Tests No results for input(s): TSH, T4TOTAL, T3FREE, THYROIDAB in the last 72 hours.  Invalid input(s): FREET3 _____________  ECHOCARDIOGRAM COMPLETE  Result Date: 07/29/2021    ECHOCARDIOGRAM REPORT   Patient Name:   Richard Barton Date of Exam: 07/29/2021 Medical Rec #:  [id number]     Height:       71.5 in Accession #:    [id number]  Weight:       258.7 lb Date of Birth:  June 18, 1956     BSA:          2.365 m Patient Age:    65 years      BP:           152/83 mmHg Patient Gender: M             HR:           65 bpm. Exam Location:  Inpatient Procedure: 2D Echo, Color Doppler and Cardiac Doppler Indications:    99991111 Chronic diastolic (congestive) heart failure  History:        Patient has prior history of Echocardiogram examinations, most                 recent 03/31/2020. CHF; Risk Factors:Hypertension and Diabetes.  Sonographer:    MH Referring Phys: [id number] [place]  1. Left ventricular ejection fraction, by estimation, is 50 to 55%. The left ventricle has low normal function. The left ventricle has no regional wall motion abnormalities. There is moderate left ventricular hypertrophy. Left ventricular diastolic parameters are indeterminate.  2. Right ventricular systolic function is mildly reduced. The right ventricular size is normal. There is normal pulmonary artery systolic pressure.  3. The mitral valve is normal in structure. Trivial mitral valve regurgitation.  4. The aortic valve was not well visualized. Aortic valve regurgitation is not visualized. No aortic stenosis  is present.  5. The inferior vena cava is dilated in size with >50% respiratory variability, suggesting right atrial pressure of 8 mmHg. FINDINGS  Left Ventricle: Left ventricular ejection fraction, by estimation, is 50 to 55%. The left ventricle has low normal function. The left ventricle has no regional wall motion abnormalities. The left ventricular internal cavity size was normal in size. There is moderate left ventricular hypertrophy. Left ventricular diastolic parameters are indeterminate. Right Ventricle: The right ventricular size is normal. No increase in right ventricular wall thickness. Right ventricular systolic function is mildly reduced. There is normal pulmonary artery systolic pressure. The tricuspid regurgitant velocity is 1.63 m/s, and with an assumed right atrial pressure of 8 mmHg, the estimated right ventricular systolic pressure is 99991111 mmHg. Left Atrium: Left atrial size was normal in size. Right Atrium: Right atrial size was normal in size. Pericardium: Trivial pericardial effusion is present. Mitral Valve: The mitral valve is normal in structure. Trivial mitral valve regurgitation. Tricuspid Valve: The tricuspid valve is normal in structure. Tricuspid valve regurgitation is not demonstrated. Aortic Valve: The aortic valve was not well visualized. Aortic valve regurgitation is not visualized. No aortic stenosis is present. Aortic valve mean gradient measures 4.0 mmHg. Aortic valve peak gradient measures 6.6 mmHg. Aortic valve area, by VTI measures 3.71 cm. Pulmonic Valve: The pulmonic valve was not well visualized. Pulmonic valve regurgitation is not visualized. Aorta: The aortic root and ascending aorta are structurally normal, with no evidence of dilitation. Venous: The inferior vena cava is dilated in size with greater than 50% respiratory variability, suggesting right atrial pressure of 8 mmHg. IAS/Shunts: The interatrial septum was not well visualized.  LEFT VENTRICLE PLAX 2D LVIDd:          5.10 cm     Diastology LVIDs:         3.60 cm     LV e' medial:    9.14 cm/s LV PW:         1.50 cm     LV E/e' medial:  13.5 LV IVS:        2.10 cm     LV e' lateral:   9.25 cm/s LVOT diam:     2.40 cm     LV E/e' lateral: 13.3 LV SV:         111 LV SV Index:   47 LVOT Area:     4.52 cm  LV Volumes (MOD) LV vol d, MOD A4C: 70.2 ml LV vol s, MOD A4C: 32.4 ml LV SV MOD A4C:     70.2 ml RIGHT VENTRICLE            IVC RV S prime:     6.53 cm/s  IVC diam: 2.60 cm TAPSE (M-mode): 1.2 cm LEFT ATRIUM           Index       RIGHT ATRIUM           Index LA diam:      4.60 cm 1.95 cm/m  RA Area:     14.00 cm LA Vol (A4C): 43.1 ml 18.23 ml/m RA Volume:   26.90 ml  11.38 ml/m  AORTIC VALVE                   PULMONIC VALVE AV Area (Vmax):    4.21 cm    PV Vmax:       0.60 m/s AV Area (Vmean):   4.16 cm    PV Peak grad:  1.4 mmHg AV Area (VTI):     3.71 cm AV Vmax:           128.00 cm/s AV Vmean:          92.900 cm/s AV VTI:            0.300 m AV Peak Grad:      6.6 mmHg AV Mean Grad:      4.0 mmHg LVOT Vmax:         119.00 cm/s LVOT Vmean:        85.400 cm/s LVOT VTI:          0.246 m LVOT/AV VTI ratio: 0.82  AORTA Ao Root diam: 3.20 cm Ao Asc diam:  3.70 cm MITRAL VALVE                TRICUSPID VALVE MV Area (PHT): 3.86 cm     TR Peak grad:   10.6 mmHg MV E velocity: 123.00 cm/s  TR Vmax:        163.00 cm/s                              SHUNTS                             Systemic VTI:  0.25 m                             Systemic Diam: 2.40 cm Oswaldo Milian MD Electronically signed by Oswaldo Milian MD Signature Date/Time: 07/29/2021/1:38:54 PM    Final    Disposition   Pt is being discharged home today in good condition.  Follow-up Plans & Appointments     Follow-up Information     Warren Lacy, PA-C Follow up on 08/11/2021.   Specialty: Internal Medicine Why: Please arrive 15 minutes early for your 10:30am post-hospital cardiology appointment Contact information: [address]  [place] [address] [phone]                  Discharge Medications   Allergies as of 08/02/2021   No Known Allergies      Medication List     STOP taking these medications    aspirin 81 MG tablet   aspirin-acetaminophen-caffeine 250-250-65 MG tablet Commonly known as: EXCEDRIN MIGRAINE   canagliflozin 300 MG Tabs tablet Commonly known as: INVOKANA   doxycycline 100 MG tablet Commonly known as: VIBRA-TABS       TAKE these medications    amLODipine 10 MG tablet Commonly known as: NORVASC Take 10 mg by mouth daily.   apixaban 5 MG Tabs tablet Commonly known as: ELIQUIS Take 1 tablet (5 mg total) by mouth 2 (two) times daily.   Basaglar KwikPen 100 UNIT/ML Inject 65 Units into the skin 2 (two) times daily.   benazepril 40 MG tablet Commonly known as: LOTENSIN Take 40 mg by mouth daily.   desoximetasone 0.05 % cream Commonly known as: TOPICORT Apply topically 2 (two) times daily.   empagliflozin 10 MG Tabs tablet Commonly known as: JARDIANCE Take 1 tablet (10 mg total) by mouth daily. Start taking on: August 03, 2021   esomeprazole 40 MG capsule Commonly known as: NEXIUM Take 40 mg by mouth daily before breakfast.   FreeStyle Libre 2 Sensor Misc   furosemide 40 MG tablet Commonly known as: LASIX Take 40 mg by mouth daily.   HumaLOG KwikPen 200 UNIT/ML KwikPen Generic drug: insulin lispro Inject 25 Units as directed 2 (two) times daily.   latanoprost 0.005 % ophthalmic solution Commonly known as: XALATAN Place 1 drop into both eyes at bedtime.   minoxidil 10 MG tablet Commonly known as: LONITEN Take 2 tablets (20 mg total) by mouth 2 (two) times daily.   nebivolol 10 MG tablet Commonly known as: BYSTOLIC Take 1 tablet (10 mg total) by mouth daily. Start taking on: August 03, 2021 What changed:  medication strength how much to take   potassium chloride 10 MEQ tablet Commonly known as: KLOR-CON Take 10  mEq by mouth 2 (two) times daily.   rosuvastatin 10 MG tablet Commonly known as: CRESTOR Take 10 mg by mouth daily.   sitaGLIPtin 100 MG tablet Commonly known as: JANUVIA Take 100 mg by mouth daily.   tamsulosin 0.4 MG Caps capsule Commonly known as: FLOMAX Take 0.4 mg by mouth daily.   timolol 0.5 % ophthalmic solution Commonly known as: BETIMOL Place 1 drop into both eyes 2 (two) times daily.           Outstanding Labs/Studies   Would benefit from an outpatient sleep study.   Duration of Discharge Encounter   Greater than 30 minutes including physician time.  Signed, Abigail Butts, PA-C 08/02/2021, 2:45 PM  Attending Note:   The patient was seen and examined.  Agree with assessment and plan as noted above.  Changes made to the above note as needed.  Patient seen and independently examined with Roby Lofts, PA .   We discussed all aspects of the encounter. I agree with the assessment and plan as stated above.     1.  Paroxysmal atrial flutter: He is status post cardioversion.  He has maintained sinus rhythm.  He has had some episodes of bradycardia and these typically occur when he is sleeping.  We will reduce his Bystolic to 10 mg a day.  Continue Eliquis.   He will follow-up  with Dr. Margaretann Loveless  in the office in several weeks.   2.  Hypertension: His blood pressure is typically been fairly difficult to control.  I suspect this is been because of his obstructive sleep apnea.  He is on numerous medications.  He will follow-up with Dr. Margaretann Loveless .    3.  CKD:   stable    I have spent a total of 40 minutes with patient reviewing hospital  notes , telemetry, EKGs, labs and examining patient as well as establishing an assessment and plan that was discussed with the patient.  > 50% of time was spent in direct patient care.    Thayer Headings, Brooke Bonito., MD, [facility] 08/03/2021, 2:37 PM [address],  [place] Pager [phone]

## 2021-08-03 ENCOUNTER — Telehealth: Payer: Self-pay

## 2021-08-03 NOTE — Telephone Encounter (Signed)
Patient contacted regarding discharge from [facility] on 08/02/21.  Patient understands to follow up with provider Caron Presume PA 9/22 at 10:30 am at [facility] office. Patient understands discharge instructions. Patient understands medications and regiment. Patient understands to bring all medications to this visit.

## 2021-08-10 NOTE — Progress Notes (Signed)
Cardiology Office Note:    Date:  08/11/2021   ID:  Richard Barton, DOB 1956-07-26, MRN [id number]  PCP:  Richard Pao, MD [place] Cardiologist: Richard Munroe, MD   Reason for visit: Follow-up cardioversion  History of Present Illness:    Richard Barton is a 65 y.o. male with a hx of left atrial myxoma with resection in 2012, A-flutter with cardioversion 2012, HTN, HLD, severe LVH, DM and CKD.  His echocardiogram 2018 showed severe LVH with no recurrence of his LA myxoma.  He thinks he likely had a coronary evaluation before his left atrial myxoma resection in 2012. He has diabetes, obesity, family history of heart disease with his father having MI young and a history of tobacco use.  He has a 60 to 90 pack year but stopped smoking 10 years ago.   He saw Richard Barton in clinic on July 04, 2021 and noticed increased shortness of breath x1 month.  EKG revealed atrial flutter with rate of 67 bpm.  He was started on Eliquis 5 mg twice daily.  He saw me September 8 complained about significant dyspnea on exertion with chest tightness and persistent fluid retention.  He works as a Psychologist, sport and exercise and also takes care of his wife with multiple sclerosis.     I admitted him for symptomatic atrial flutter and acute on chronic diastolic heart failure.  He underwent a successful DCCV on September 12.  Bystolic was decreased to 10 mg daily given bradycardia.  He was diuresed IV Lasix with net -13 L and discharge weight 252 pounds.  He was discharged with Lasix 40 mg daily.  Today, patient states he is much improved since our last visit.  He states he is slowly getting his "wind back."  He definitely can tell a difference in symptoms after his cardioversion.  His stamina is improving.  He feels better every day.  He has not returned to his regular walking regimen (prev walking 12,000-15,000 steps/day).  He now gets short of breath when he is doing a lot of work in the yard but no longer short of  breath when he is just walking room to room.  He noticed slight lightheadedness after hospital discharge which has since resolved.  No further chest pain or abdominal fullness.  He has mild leg swelling.  He saw his nephrologist the day after discharge who increased his Lasix to 40 twice a day.  Since discharge, he has lost 2-3 additional pounds.  He can still feel the weight coming off.  He continues with apneic episodes and is interested in sleep study.  In the hospital, he says they noted his heart rate dipping down during sleep.    Past Medical History:  Diagnosis Date   Atrial flutter ([place])    Atrial myxoma    Left   Benign neoplasm of heart 08/07/2011   Chronic right shoulder pain 02/14/2018   Chronic right-sided low back pain with right-sided sciatica 02/14/2018   Diabetes mellitus type II    DM2 (diabetes mellitus, type 2) (HCC)    HTN (hypertension)    Hyperlipidemia    Renal insufficiency    Unspecified pleural effusion 08/07/2011    Past Surgical History:  Procedure Laterality Date   CARDIAC CATHETERIZATION     CARDIOVERSION N/A 08/01/2021   Procedure: CARDIOVERSION;  Surgeon: Richard Perla, MD;  Location: [facility] ENDOSCOPY;  Service: Cardiovascular;  Laterality: N/A;   cardioversion of atrial flutter.  07/31/11   Right  miniature thoracotomy for resection of left atrial myxoma.  06/09/11   Dr Roxy Manns    Current Medications: Current Meds  Medication Sig   amLODipine (NORVASC) 10 MG tablet Take 10 mg by mouth daily.     apixaban (ELIQUIS) 5 MG TABS tablet Take 1 tablet (5 mg total) by mouth 2 (two) times daily.   benazepril (LOTENSIN) 40 MG tablet Take 40 mg by mouth daily.     Continuous Blood Gluc Sensor (FREESTYLE LIBRE 2 SENSOR) MISC    desoximetasone (TOPICORT) 0.05 % cream Apply topically 2 (two) times daily.     empagliflozin (JARDIANCE) 10 MG TABS tablet Take 1 tablet (10 mg total) by mouth daily.   esomeprazole (NEXIUM) 40 MG capsule Take 40 mg by mouth daily before  breakfast.     furosemide (LASIX) 40 MG tablet Take 40 mg by mouth daily.   HUMALOG KWIKPEN 200 UNIT/ML SOPN Inject 25 Units as directed 2 (two) times daily.   Insulin Glargine (BASAGLAR KWIKPEN) 100 UNIT/ML SOPN Inject 65 Units into the skin 2 (two) times daily.   latanoprost (XALATAN) 0.005 % ophthalmic solution Place 1 drop into both eyes at bedtime.   minoxidil (LONITEN) 10 MG tablet Take 2 tablets (20 mg total) by mouth 2 (two) times daily.   nebivolol (BYSTOLIC) 10 MG tablet Take 1 tablet (10 mg total) by mouth daily.   potassium chloride (KLOR-CON) 10 MEQ tablet Take 10 mEq by mouth 2 (two) times daily.   rosuvastatin (CRESTOR) 10 MG tablet Take 10 mg by mouth daily.   sitaGLIPtin (JANUVIA) 100 MG tablet Take 100 mg by mouth daily.     tamsulosin (FLOMAX) 0.4 MG CAPS capsule Take 0.4 mg by mouth daily.   timolol (BETIMOL) 0.5 % ophthalmic solution Place 1 drop into both eyes 2 (two) times daily.     Allergies:   Patient has no known allergies.   Social History   Socioeconomic History   Marital status: Married    Spouse name: Not on file   Number of children: Not on file   Years of education: Not on file   Highest education level: Not on file  Occupational History   Occupation: IT sales professional: SELF-EMPLOYED  Tobacco Use   Smoking status: Former    Types: Cigarettes    Quit date: 11/20/2006    Years since quitting: 14.7   Smokeless tobacco: Never  Vaping Use   Vaping Use: Never used  Substance and Sexual Activity   Alcohol use: No   Drug use: No   Sexual activity: Not on file  Other Topics Concern   Not on file  Social History Narrative   Not on file   Social Determinants of Health   Financial Resource Strain: Not on file  Food Insecurity: Not on file  Transportation Needs: Not on file  Physical Activity: Not on file  Stress: Not on file  Social Connections: Not on file     Family History: The patient's family history includes CAD in his father.  ROS:    Please see the history of present illness.     EKGs/Labs/Other Studies Reviewed:    EKG:  The ekg ordered today demonstrates sinus bradycardia with first-degree AV block, heart rate 54, PR interval 255ms, QRS duration 96 ms, T wave abnormality, unchanged from 2021.  Recent Labs: 07/28/2021: B Natriuretic Peptide 111.2 07/29/2021: Hemoglobin 14.9; Platelets 221; TSH 1.171 08/01/2021: BUN 22; Creatinine, Ser 1.84; Potassium 3.7; Sodium 140  Recent Lipid Panel  Component Value Date/Time   CHOL 91 07/29/2021 0339   TRIG 86 07/29/2021 0339   HDL 26 (L) 07/29/2021 0339   CHOLHDL 3.5 07/29/2021 0339   VLDL 17 07/29/2021 0339   LDLCALC 48 07/29/2021 0339    Physical Exam:    VS:  BP (!) 142/70 (BP Location: Left Arm, Patient Position: Sitting, Cuff Size: Large)   Pulse (!) 54   Resp 20   Ht 5\' 11"  (1.803 m)   Wt 252 lb 12.8 oz (114.7 kg)   SpO2 94%   BMI 35.26 kg/m     Wt Readings from Last 3 Encounters:  08/11/21 252 lb 12.8 oz (114.7 kg)  08/02/21 252 lb 6.4 oz (114.5 kg)  07/28/21 264 lb 12.8 oz (120.1 kg)     GEN:  Well nourished, well developed in no acute distress HEENT: Normal NECK: No JVD; No carotid bruits CARDIAC: RRR, no murmurs, rubs, gallops RESPIRATORY:  Clear to auscultation without rales, wheezing or rhonchi  ABDOMEN: Soft, non-tender, non-distended MUSCULOSKELETAL: trace edema bilaterally; No deformity  SKIN: Warm and dry NEUROLOGIC:  Alert and oriented PSYCHIATRIC:  Normal affect   ASSESSMENT AND PLAN   Paroxysmal atrial flutter, in NSR today -S/p DCCV 08/01/21 converted to sinus brady -Echo: EF 50-55%, no RWMA, moderate LVH, indeterminate LV diastolic function, mildly reduced RV  -Continue eliquis 5mg  BID for stroke ppx  -Referred for outpatient sleep study  Chronic diastolic CHF with mild RV dysfunction, euvolemic -d/c weight 08/02/21: 252lbs; weight stable -He has been very strict on his salt intake. -Continue Lasix 40 mg twice a day.  We will  check a BMET today since he has run out of potassium supplements (had hypokalemia in the hospital) -Continue Jardiance   HTN, slightly elevated -Continue amlodipine, nebivolol, benazepril, and minoxidil  -I think his blood pressure will improve with treatment of sleep apnea and improved diet.  If blood pressure remains over 130/80, will consider changing benazepril to Entresto.   HLD, well controlled -LDL 48 -Continue crestor   Suspected OSA - Ordered patient sleep study.  Control of his sleep apnea would decrease his risk of going back in atrial flutter.  Weight loss recommended.  Obesity -Patient seems to be aware of good diet and exercise habits.  We will hold off on referral to nutrition or to PREP exercise program for now.  [place],000 steps per day.  Precordial chest pain, resolved. -Resolved after restoration of normal sinus rhythm and euvolemia. -He is high risk for CAD with history of hypertension, obesity, diabetes, family history of coronary disease and tobacco use history.  Would have a low threshold for stress testing if develops any concerning symptoms.   Left atrial myxoma - s/p resection of LAA myxoma in 2012 - intermittent periods of sharp right-sided chest discomfort related to incisional site - echo in 2018 showed no recurrence. - continue to monitor   Medication Adjustments/Labs and Tests Ordered: Current medicines are reviewed at length with the patient today.  Concerns regarding medicines are outlined above.  Orders Placed This Encounter  Procedures   Basic metabolic panel   EKG 96-VELF   Split night study   No orders of the defined types were placed in this encounter.   Patient Instructions  Medication Instructions:  No Changes *If you need a refill on your cardiac medications before your next appointment, please call your pharmacy*   Lab Work: BMET (Today) If you have labs (blood work) drawn  today and your tests  are completely normal, you will receive your results only by: MyChart Message (if you have MyChart) OR A paper copy in the mail If you have any lab test that is abnormal or we need to change your treatment, we will call you to review the results.   Testing/Procedures: [place] at [facility]. [place] [address] Your physician has recommended that you have a sleep study. This test records several body functions during sleep, including: brain activity, eye movement, oxygen and carbon dioxide blood levels, heart rate and rhythm, breathing rate and rhythm, the flow of air through your mouth and nose, snoring, body muscle movements, and chest and belly movement.    Follow-Up: At [facility], you and your health needs are our priority.  As part of our continuing mission to provide you with exceptional heart care, we have created designated Provider Care Teams.  These Care Teams include your primary Cardiologist (physician) and Advanced Practice Providers (APPs -  Physician Assistants and Nurse Practitioners) who all work together to provide you with the care you need, when you need it.  We recommend signing up for the patient portal called "MyChart".  Sign up information is provided on this After Visit Summary.  MyChart is used to connect with patients for Virtual Visits (Telemedicine).  Patients are able to view lab/test results, encounter notes, upcoming appointments, etc.  Non-urgent messages can be sent to your provider as well.   To learn more about what you can do with MyChart, go to [email or web address].    Your next appointment:   3 month(s)  The format for your next appointment:   In Person  Provider:   Caron Presume, PA-C       Signed, Richard Lacy, PA-C  08/11/2021 10:56 AM    [place]

## 2021-08-11 ENCOUNTER — Other Ambulatory Visit: Payer: Self-pay

## 2021-08-11 ENCOUNTER — Encounter: Payer: Self-pay | Admitting: Physician Assistant

## 2021-08-11 ENCOUNTER — Ambulatory Visit (INDEPENDENT_AMBULATORY_CARE_PROVIDER_SITE_OTHER): Payer: 59 | Admitting: Physician Assistant

## 2021-08-11 VITALS — BP 142/70 | HR 54 | Resp 20 | Ht 71.0 in | Wt 252.8 lb

## 2021-08-11 DIAGNOSIS — R29818 Other symptoms and signs involving the nervous system: Secondary | ICD-10-CM | POA: Diagnosis not present

## 2021-08-11 DIAGNOSIS — I4892 Unspecified atrial flutter: Secondary | ICD-10-CM | POA: Diagnosis not present

## 2021-08-11 DIAGNOSIS — I5032 Chronic diastolic (congestive) heart failure: Secondary | ICD-10-CM

## 2021-08-11 DIAGNOSIS — I5033 Acute on chronic diastolic (congestive) heart failure: Secondary | ICD-10-CM

## 2021-08-11 DIAGNOSIS — R072 Precordial pain: Secondary | ICD-10-CM | POA: Diagnosis not present

## 2021-08-11 DIAGNOSIS — I10 Essential (primary) hypertension: Secondary | ICD-10-CM

## 2021-08-11 LAB — BASIC METABOLIC PANEL
BUN/Creatinine Ratio: 9 — ABNORMAL LOW (ref 10–24)
BUN: 13 mg/dL (ref 8–27)
CO2: 27 mmol/L (ref 20–29)
Calcium: 9.8 mg/dL (ref 8.6–10.2)
Chloride: 104 mmol/L (ref 96–106)
Creatinine, Ser: 1.45 mg/dL — ABNORMAL HIGH (ref 0.76–1.27)
Glucose: 72 mg/dL (ref 65–99)
Potassium: 4.5 mmol/L (ref 3.5–5.2)
Sodium: 145 mmol/L — ABNORMAL HIGH (ref 134–144)
eGFR: 54 mL/min/{1.73_m2} — ABNORMAL LOW (ref >59)

## 2021-08-11 NOTE — Patient Instructions (Signed)
Medication Instructions:  No Changes *If you need a refill on your cardiac medications before your next appointment, please call your pharmacy*   Lab Work: BMET (Today) If you have labs (blood work) drawn today and your tests are completely normal, you will receive your results only by: [place] (if you have MyChart) OR A paper copy in the mail If you have any lab test that is abnormal or we need to change your treatment, we will call you to review the results.   Testing/Procedures: [place] at [facility]. [place] [address] Your physician has recommended that you have a sleep study. This test records several body functions during sleep, including: brain activity, eye movement, oxygen and carbon dioxide blood levels, heart rate and rhythm, breathing rate and rhythm, the flow of air through your mouth and nose, snoring, body muscle movements, and chest and belly movement.    Follow-Up: At [facility], you and your health needs are our priority.  As part of our continuing mission to provide you with exceptional heart care, we have created designated Provider Care Teams.  These Care Teams include your primary Cardiologist (physician) and Advanced Practice Providers (APPs -  Physician Assistants and Nurse Practitioners) who all work together to provide you with the care you need, when you need it.  We recommend signing up for the patient portal called "MyChart".  Sign up information is provided on this After Visit Summary.  MyChart is used to connect with patients for Virtual Visits (Telemedicine).  Patients are able to view lab/test results, encounter notes, upcoming appointments, etc.  Non-urgent messages can be sent to your provider as well.   To learn more about what you can do with MyChart, go to [email or web address].    Your next appointment:   3 month(s)  The format for your next appointment:   In Person  Provider:   Caron Presume, PA-C

## 2021-08-12 ENCOUNTER — Telehealth: Payer: Self-pay

## 2021-08-12 NOTE — Telephone Encounter (Addendum)
Called patient regarding blood test results ----- Message from Warren Lacy, PA-C sent at 08/12/2021  9:06 AM EDT ----- Your kidney function has improved!  Your potassium is normal.  You do NOT need to take potassium supplements.  Have a good weekend!

## 2021-08-17 ENCOUNTER — Other Ambulatory Visit (HOSPITAL_COMMUNITY): Payer: Self-pay

## 2021-11-01 ENCOUNTER — Telehealth: Payer: Self-pay

## 2021-11-01 NOTE — Telephone Encounter (Signed)
Letter has been sent to patient instructing them to call us if they are still interested in completing their sleep study. If we have not received a response from the patient within 30 days of this notice, the order will be cancelled and they will need to discuss the need for a sleep study at their next office visit.  ° °

## 2021-11-02 ENCOUNTER — Other Ambulatory Visit: Payer: Self-pay

## 2021-11-02 ENCOUNTER — Telehealth: Payer: Self-pay | Admitting: Internal Medicine

## 2021-11-02 ENCOUNTER — Ambulatory Visit: Payer: 59 | Admitting: Cardiology

## 2021-11-02 ENCOUNTER — Encounter: Payer: Self-pay | Admitting: Cardiology

## 2021-11-02 VITALS — BP 136/60 | HR 78 | Ht 71.5 in | Wt 261.0 lb

## 2021-11-02 DIAGNOSIS — E782 Mixed hyperlipidemia: Secondary | ICD-10-CM

## 2021-11-02 DIAGNOSIS — Z79899 Other long term (current) drug therapy: Secondary | ICD-10-CM

## 2021-11-02 DIAGNOSIS — D151 Benign neoplasm of heart: Secondary | ICD-10-CM | POA: Diagnosis not present

## 2021-11-02 DIAGNOSIS — I480 Paroxysmal atrial fibrillation: Secondary | ICD-10-CM | POA: Diagnosis not present

## 2021-11-02 DIAGNOSIS — I483 Typical atrial flutter: Secondary | ICD-10-CM

## 2021-11-02 DIAGNOSIS — I130 Hypertensive heart and chronic kidney disease with heart failure and stage 1 through stage 4 chronic kidney disease, or unspecified chronic kidney disease: Secondary | ICD-10-CM

## 2021-11-02 DIAGNOSIS — R600 Localized edema: Secondary | ICD-10-CM | POA: Diagnosis not present

## 2021-11-02 DIAGNOSIS — I5020 Unspecified systolic (congestive) heart failure: Secondary | ICD-10-CM

## 2021-11-02 MED ORDER — METOLAZONE 5 MG PO TABS: 5.0000 mg | ORAL_TABLET | Freq: Every day | ORAL | 0 refills | Status: DC

## 2021-11-02 MED ORDER — TORSEMIDE 40 MG PO TABS: 40.0000 mg | ORAL_TABLET | Freq: Two times a day (BID) | ORAL | 3 refills | Status: DC

## 2021-11-02 NOTE — Progress Notes (Addendum)
Cardiology Office Note:    Date:  11/02/2021   ID:  Richard Barton, DOB Oct 29, 1956, MRN [id number]  PCP:  Haywood Pao, MD  Cardiologist:  Elouise Munroe, MD  Electrophysiologist:  None   Referring MD: Haywood Pao, MD   Chief Complaint  Patient presents with   Follow-up   Shortness of Breath   Edema    BLE.   Headache    History of Present Illness:    Richard Barton is a 65 y.o. male with a hx of left atrial myxoma with resection in 2012, atrial flutter on Eliquis 5mg  BID, hypertension, hyperlipidemia, severe LVH, diabetes and chronic kidney disease is here today for follow-up visit.  The patient is a patient of Dr. Marcelline Mates but was last seen in September 2022 by Caron Presume, PA.  At that time he appeared to be doing well from a cardiovascular standpoint.  No changes were made to his medication.  Today he called because he has been gaining weight and he feels short of breath and he tells me he is probably in close to 10 pounds.  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    Past Medical History:  Diagnosis Date   Atrial flutter ([place])    Atrial myxoma    Left   Benign neoplasm of heart 08/07/2011   Chronic right shoulder pain 02/14/2018   Chronic right-sided low back pain with right-sided sciatica 02/14/2018   Diabetes mellitus type II    DM2 (diabetes mellitus, type 2) (HCC)    HTN (hypertension)    Hyperlipidemia    Renal insufficiency    Unspecified pleural effusion 08/07/2011    Past Surgical History:  Procedure Laterality Date   CARDIAC CATHETERIZATION     CARDIOVERSION N/A 08/01/2021   Procedure: CARDIOVERSION;  Surgeon: Lelon Perla, MD;  Location: [facility] ENDOSCOPY;  Service: Cardiovascular;  Laterality: N/A;   cardioversion of atrial flutter.  07/31/11   Right miniature thoracotomy for resection of left atrial myxoma.  06/09/11   Dr Roxy Manns    Current Medications: Current Meds  Medication Sig   amLODipine (NORVASC) 10 MG tablet Take 10 mg  by mouth daily.     apixaban (ELIQUIS) 5 MG TABS tablet Take 1 tablet (5 mg total) by mouth 2 (two) times daily.   benazepril (LOTENSIN) 40 MG tablet Take 40 mg by mouth daily.     Continuous Blood Gluc Sensor (FREESTYLE LIBRE 2 SENSOR) MISC    desoximetasone (TOPICORT) 0.05 % cream Apply topically 2 (two) times daily.     empagliflozin (JARDIANCE) 10 MG TABS tablet Take 1 tablet (10 mg total) by mouth daily.   esomeprazole (NEXIUM) 40 MG capsule Take 40 mg by mouth daily before breakfast.     fluticasone furoate-vilanterol (BREO ELLIPTA) 200-25 MCG/ACT AEPB Inhale 1 puff into the lungs as needed.   HUMALOG KWIKPEN 200 UNIT/ML SOPN Inject 25 Units as directed 2 (two) times daily.   Insulin Glargine (BASAGLAR KWIKPEN) 100 UNIT/ML SOPN Inject 65 Units into the skin 2 (two) times daily.   latanoprost (XALATAN) 0.005 % ophthalmic solution Place 1 drop into both eyes at bedtime.   minoxidil (LONITEN) 10 MG tablet Take 2 tablets (20 mg total) by mouth 2 (two) times daily.   nebivolol (BYSTOLIC) 10 MG tablet Take 1 tablet (10 mg total) by mouth daily.   rosuvastatin (CRESTOR) 10 MG tablet Take 10 mg by mouth daily.   sitaGLIPtin (JANUVIA) 100 MG tablet Take 100  mg by mouth daily.     tamsulosin (FLOMAX) 0.4 MG CAPS capsule Take 0.4 mg by mouth daily.   timolol (BETIMOL) 0.5 % ophthalmic solution Place 1 drop into both eyes 2 (two) times daily.   [DISCONTINUED] furosemide (LASIX) 40 MG tablet Take 40 mg by mouth 2 (two) times daily.   [DISCONTINUED] metolazone (ZAROXOLYN) 5 MG tablet Take 1 tablet (5 mg total) by mouth daily.   [DISCONTINUED] Torsemide 40 MG TABS Take 40 mg by mouth in the morning and at bedtime.     Allergies:   Patient has no known allergies.   Social History   Socioeconomic History   Marital status: Married    Spouse name: Not on file   Number of children: Not on file   Years of education: Not on file   Highest education level: Not on file  Occupational History    Occupation: IT sales professional: SELF-EMPLOYED  Tobacco Use   Smoking status: Former    Types: Cigarettes    Quit date: 11/20/2006    Years since quitting: 14.9   Smokeless tobacco: Never  Vaping Use   Vaping Use: Never used  Substance and Sexual Activity   Alcohol use: No   Drug use: No   Sexual activity: Not on file  Other Topics Concern   Not on file  Social History Narrative   Not on file   Social Determinants of Health   Financial Resource Strain: Not on file  Food Insecurity: Not on file  Transportation Needs: Not on file  Physical Activity: Not on file  Stress: Not on file  Social Connections: Not on file     Family History: The patient's family history includes CAD in his father.  ROS:   Review of Systems  Constitution: Negative for decreased appetite, fever and weight gain.  HENT: Negative for congestion, ear discharge, hoarse voice and sore throat.   Eyes: Negative for discharge, redness, vision loss in right eye and visual halos.  Cardiovascular: Negative for chest pain, dyspnea on exertion, leg swelling, orthopnea and palpitations.  Respiratory: Negative for cough, hemoptysis, shortness of breath and snoring.   Endocrine: Negative for heat intolerance and polyphagia.  Hematologic/Lymphatic: Negative for bleeding problem. Does not bruise/bleed easily.  Skin: Negative for flushing, nail changes, rash and suspicious lesions.  Musculoskeletal: Negative for arthritis, joint pain, muscle cramps, myalgias, neck pain and stiffness.  Gastrointestinal: Negative for abdominal pain, bowel incontinence, diarrhea and excessive appetite.  Genitourinary: Negative for decreased libido, genital sores and incomplete emptying.  Neurological: Negative for brief paralysis, focal weakness, headaches and loss of balance.  Psychiatric/Behavioral: Negative for altered mental status, depression and suicidal ideas.  Allergic/Immunologic: Negative for HIV exposure and persistent  infections.    EKGs/Labs/Other Studies Reviewed:    The following studies were reviewed today:   EKG:  The ekg ordered today demonstrates   Recent Labs: 07/28/2021: B Natriuretic Peptide 111.2 07/29/2021: Hemoglobin 14.9; Platelets 221; TSH 1.171 08/11/2021: BUN 13; Creatinine, Ser 1.45; Potassium 4.5; Sodium 145  Recent Lipid Panel    Component Value Date/Time   CHOL 91 07/29/2021 0339   TRIG 86 07/29/2021 0339   HDL 26 (L) 07/29/2021 0339   CHOLHDL 3.5 07/29/2021 0339   VLDL 17 07/29/2021 0339   LDLCALC 48 07/29/2021 0339    Physical Exam:    VS:  BP 136/60 (BP Location: Left Arm, Patient Position: Sitting, Cuff Size: Large)    Pulse 78    Ht 5' 11.5" (1.816 m)  Wt 261 lb (118.4 kg)    BMI 35.89 kg/m     Wt Readings from Last 3 Encounters:  11/02/21 261 lb (118.4 kg)  08/11/21 252 lb 12.8 oz (114.7 kg)  08/02/21 252 lb 6.4 oz (114.5 kg)     GEN: Well nourished, well developed in no acute distress HEENT: Normal NECK: No JVD; No carotid bruits LYMPHATICS: No lymphadenopathy CARDIAC: S1S2 noted,RRR, no murmurs, rubs, gallops RESPIRATORY:  Clear to auscultation without rales, wheezing or rhonchi  ABDOMEN: Soft, non-tender, non-distended, +bowel sounds, no guarding. EXTREMITIES: +3 bilateral leg edema, No cyanosis, no clubbing MUSCULOSKELETAL:  No deformity  SKIN: Warm and dry NEUROLOGIC:  Alert and oriented x 3, non-focal PSYCHIATRIC:  Normal affect, good insight  ASSESSMENT:    1. Medication management   2. Leg edema   3. PAF (paroxysmal atrial fibrillation) ([place])   4. Left Atrial myxoma   5. Hypertensive heart and chronic kidney disease with systolic congestive heart failure, unspecified CKD stage, unspecified HF chronicity ([place])   6. Typical atrial flutter ([place])   7. Mixed hyperlipidemia    PLAN:    He is any significant leg edema which is concerning for worsening fluid overload I am going to stop the Lasix and transition the patient to torsemide 40 mg daily  for now.  Would eventually plan to transition him to once a day.  In the meantime for 3 days he will use Zaroxolyn 5 mg 30 minutes before his first dose of torsemide.  Unfortunately he did not get a EKG today working to have the patient come back tomorrow for nurse visit to check EKG to make sure that he is not back in atrial flutter.  We will have to monitor his kidney function we will get blood work today to assess his creatinine.  He then once we have gotten the patient closer to his baseline we will transition his torsemide to 40 mg daily.  Blood work will be done today.  The patient is in agreement with the above plan. The patient left the office in stable condition.  The patient will follow up as scheduled with Dr. Margaretann Loveless.   Addendum 11/03/2021 patient did come back today for an electrocardiogram he is in sinus rhythm, heart rate 69 bpm with prolonged PR interval/first-degree AV block to 38 ms and nonspecific interventricular conduction defect.  Medication Adjustments/Labs and Tests Ordered: Current medicines are reviewed at length with the patient today.  Concerns regarding medicines are outlined above.  Orders Placed This Encounter  Procedures   Basic Metabolic Panel (BMET)   Magnesium   Pro b natriuretic peptide (BNP)   Meds ordered this encounter  Medications   DISCONTD: Torsemide 40 MG TABS    Sig: Take 40 mg by mouth in the morning and at bedtime.    Dispense:  180 tablet    Refill:  3   DISCONTD: metolazone (ZAROXOLYN) 5 MG tablet    Sig: Take 1 tablet (5 mg total) by mouth daily.    Dispense:  3 tablet    Refill:  0   metolazone (ZAROXOLYN) 5 MG tablet    Sig: Take 1 tablet (5 mg total) by mouth daily.    Dispense:  3 tablet    Refill:  0   Torsemide 40 MG TABS    Sig: Take 40 mg by mouth in the morning and at bedtime.    Dispense:  180 tablet    Refill:  3    Patient Instructions  Medication Instructions:  Your physician has recommended you make the  following change in your medication:  STOP: Lasix  START: Torsemide 40 mg twice daily   START: Metolazone 5 mg 30 minutes before Torsemide dose for 3 doses *If you need a refill on your cardiac medications before your next appointment, please call your pharmacy*   Lab Work: Your physician recommends that you return for lab work in:  TODAY: BMET, Mag, BNP If you have labs (blood work) drawn today and your tests are completely normal, you will receive your results only by: [place] (if you have MyChart) OR A paper copy in the mail If you have any lab test that is abnormal or we need to change your treatment, we will call you to review the results.   Testing/Procedures: None   Follow-Up: At [facility], you and your health needs are our priority.  As part of our continuing mission to provide you with exceptional heart care, we have created designated Provider Care Teams.  These Care Teams include your primary Cardiologist (physician) and Advanced Practice Providers (APPs -  Physician Assistants and Nurse Practitioners) who all work together to provide you with the care you need, when you need it.  We recommend signing up for the patient portal called "MyChart".  Sign up information is provided on this After Visit Summary.  MyChart is used to connect with patients for Virtual Visits (Telemedicine).  Patients are able to view lab/test results, encounter notes, upcoming appointments, etc.  Non-urgent messages can be sent to your provider as well.   To learn more about what you can do with MyChart, go to [email or web address].    Your next appointment:   4 week(s)  The format for your next appointment:   In Person  Provider:   Elouise Munroe, MD     Other Instructions   Adopting a Healthy Lifestyle.  Know what a healthy weight is for you (roughly BMI <25) and aim to maintain this   Aim for 7+ servings of fruits and vegetables daily   65-80+ fluid ounces of water or  unsweet tea for healthy kidneys   Limit to max 1 drink of alcohol per day; avoid smoking/tobacco   Limit animal fats in diet for cholesterol and heart health - choose grass fed whenever available   Avoid highly processed foods, and foods high in saturated/trans fats   Aim for low stress - take time to unwind and care for your mental health   Aim for 150 min of moderate intensity exercise weekly for heart health, and weights twice weekly for bone health   Aim for 7-9 hours of sleep daily   When it comes to diets, agreement about the perfect plan isnt easy to find, even among the experts. Experts at the [employer] developed an idea known as the Healthy Eating Plate. Just imagine a plate divided into logical, healthy portions.   The emphasis is on diet quality:   Load up on vegetables and fruits - one-half of your plate: Aim for color and variety, and remember that potatoes dont count.   Go for whole grains - one-quarter of your plate: Whole wheat, barley, wheat berries, quinoa, oats, brown rice, and foods made with them. If you want pasta, go with whole wheat pasta.   Protein power - one-quarter of your plate: Fish, chicken, beans, and nuts are all healthy, versatile protein sources. Limit red meat.   The diet, however, does go beyond the plate, offering a  few other suggestions.   Use healthy plant oils, such as olive, canola, soy, corn, sunflower and peanut. Check the labels, and avoid partially hydrogenated oil, which have unhealthy trans fats.   If youre thirsty, drink water. Coffee and tea are good in moderation, but skip sugary drinks and limit milk and dairy products to one or two daily servings.   The type of carbohydrate in the diet is more important than the amount. Some sources of carbohydrates, such as vegetables, fruits, whole grains, and beans-are healthier than others.   Finally, stay active  Signed, Berniece Salines, DO  11/02/2021 4:48 PM    [facility]

## 2021-11-02 NOTE — Patient Instructions (Addendum)
Medication Instructions:  Your physician has recommended you make the following change in your medication:  STOP: Lasix  START: Torsemide 40 mg twice daily   START: Metolazone 5 mg 30 minutes before Torsemide dose for 3 doses *If you need a refill on your cardiac medications before your next appointment, please call your pharmacy*   Lab Work: Your physician recommends that you return for lab work in:  TODAY: BMET, Mag, BNP If you have labs (blood work) drawn today and your tests are completely normal, you will receive your results only by: [place] (if you have MyChart) OR A paper copy in the mail If you have any lab test that is abnormal or we need to change your treatment, we will call you to review the results.   Testing/Procedures: None   Follow-Up: At [facility], you and your health needs are our priority.  As part of our continuing mission to provide you with exceptional heart care, we have created designated Provider Care Teams.  These Care Teams include your primary Cardiologist (physician) and Advanced Practice Providers (APPs -  Physician Assistants and Nurse Practitioners) who all work together to provide you with the care you need, when you need it.  We recommend signing up for the patient portal called "MyChart".  Sign up information is provided on this After Visit Summary.  MyChart is used to connect with patients for Virtual Visits (Telemedicine).  Patients are able to view lab/test results, encounter notes, upcoming appointments, etc.  Non-urgent messages can be sent to your provider as well.   To learn more about what you can do with MyChart, go to [email or web address].    Your next appointment:   4 week(s)  The format for your next appointment:   In Person  Provider:   Elouise Munroe, MD     Other Instructions

## 2021-11-02 NOTE — Telephone Encounter (Signed)
Pt c/o swelling: STAT is pt has developed SOB within 24 hours  If swelling, where is the swelling located? Legs and arms  How much weight have you gained and in what time span? 8 pounds in about a week to two weeks  Have you gained 3 pounds in a day or 5 pounds in a week? Yes, probably  Do you have a log of your daily weights (if so, list)? no  Are you currently taking a fluid pill? Yes, states that last couple of days he's had to take 3  Are you currently SOB? Not right now but does get SOB when he's out moving around  Have you traveled recently? No    Called pt to reschedule appt with Caron Presume for tomorrow beings that's the provider will not be in the office. Patient stated that he was really counting on this appointment because he is retaining fluid. Not sure if he needs to be on a DOD day. Next available APP appt at [facility] is in February.

## 2021-11-02 NOTE — Telephone Encounter (Signed)
Called patient about his message. Patient complaining of SOB with activity, 8 lbs weight gain, and BLE edema. Patient stated he takes Lasix 40 mg BID and some days he takes it 3 times a day.  Patient stated his BP is good at 136/76. Patient stated he feels like something is out of wack and he had been putting off calling the office, since he had an appointment tomorrow with PA. Appointment with PA was canceled and patient is stressing about having weight gain and not feeling right. Made patient an appointment with DOD, Dr. Harriet Masson, to be evaluated. Patient agreed to plan.

## 2021-11-03 ENCOUNTER — Ambulatory Visit: Payer: 59 | Admitting: Physician Assistant

## 2021-11-03 ENCOUNTER — Ambulatory Visit (INDEPENDENT_AMBULATORY_CARE_PROVIDER_SITE_OTHER): Payer: 59

## 2021-11-03 ENCOUNTER — Encounter: Payer: Self-pay | Admitting: Cardiology

## 2021-11-03 DIAGNOSIS — I483 Typical atrial flutter: Secondary | ICD-10-CM | POA: Diagnosis not present

## 2021-11-03 LAB — BASIC METABOLIC PANEL
BUN/Creatinine Ratio: 9 — ABNORMAL LOW (ref 10–24)
BUN: 14 mg/dL (ref 8–27)
CO2: 26 mmol/L (ref 20–29)
Calcium: 9.5 mg/dL (ref 8.6–10.2)
Chloride: 102 mmol/L (ref 96–106)
Creatinine, Ser: 1.58 mg/dL — ABNORMAL HIGH (ref 0.76–1.27)
Glucose: 101 mg/dL — ABNORMAL HIGH (ref 70–99)
Potassium: 4 mmol/L (ref 3.5–5.2)
Sodium: 142 mmol/L (ref 134–144)
eGFR: 49 mL/min/{1.73_m2} — ABNORMAL LOW (ref >59)

## 2021-11-03 LAB — PRO B NATRIURETIC PEPTIDE: NT-Pro BNP: 341 pg/mL — ABNORMAL HIGH (ref 0–210)

## 2021-11-03 LAB — MAGNESIUM: Magnesium: 2.2 mg/dL (ref 1.6–2.3)

## 2021-11-03 NOTE — Progress Notes (Signed)
EKG reviewed by Dr. Harriet Masson

## 2021-11-04 ENCOUNTER — Telehealth: Payer: Self-pay | Admitting: Internal Medicine

## 2021-11-04 NOTE — Telephone Encounter (Signed)
Spoke with [employer]. At [facility] by [employer]. Informed Tye [place] that Furosemide has been discontinued and pt is now taking Torsemide and metolazone per last office note.

## 2021-11-04 NOTE — Telephone Encounter (Signed)
° °  Pt c/o medication issue:  1. Name of Medication:   Torsemide 40 MG TABS  metolazone (ZAROXOLYN) 5 MG tablet  2. How are you currently taking this medication (dosage and times per day)?   3. Are you having a reaction (difficulty breathing--STAT)?   4. What is your medication issue? [facility] calling they would like to verify these prescriptions they received, if this is to replace furosemide or additional meds for the pt.

## 2021-11-09 ENCOUNTER — Other Ambulatory Visit: Payer: Self-pay

## 2021-11-09 DIAGNOSIS — Z79899 Other long term (current) drug therapy: Secondary | ICD-10-CM

## 2021-11-09 NOTE — Progress Notes (Signed)
Orders for repeat lab work placed.  

## 2021-11-24 ENCOUNTER — Telehealth: Payer: Self-pay | Admitting: Internal Medicine

## 2021-11-24 NOTE — Telephone Encounter (Signed)
Pt received letter regarding sleep study, he would like to follow through with it. Saw Dr. Harriet Masson in December 2022 and has f/u with Laurann Montana on 12/01/21

## 2021-12-01 ENCOUNTER — Other Ambulatory Visit: Payer: Self-pay

## 2021-12-01 ENCOUNTER — Encounter (HOSPITAL_BASED_OUTPATIENT_CLINIC_OR_DEPARTMENT_OTHER): Payer: Self-pay | Admitting: Family

## 2021-12-01 ENCOUNTER — Ambulatory Visit (HOSPITAL_BASED_OUTPATIENT_CLINIC_OR_DEPARTMENT_OTHER): Payer: 59 | Admitting: Family

## 2021-12-01 VITALS — BP 158/90 | HR 75 | Ht 71.0 in | Wt 258.0 lb

## 2021-12-01 DIAGNOSIS — Z79899 Other long term (current) drug therapy: Secondary | ICD-10-CM

## 2021-12-01 DIAGNOSIS — I483 Typical atrial flutter: Secondary | ICD-10-CM | POA: Diagnosis not present

## 2021-12-01 DIAGNOSIS — I5020 Unspecified systolic (congestive) heart failure: Secondary | ICD-10-CM

## 2021-12-01 DIAGNOSIS — D151 Benign neoplasm of heart: Secondary | ICD-10-CM

## 2021-12-01 DIAGNOSIS — I4892 Unspecified atrial flutter: Secondary | ICD-10-CM

## 2021-12-01 DIAGNOSIS — I5032 Chronic diastolic (congestive) heart failure: Secondary | ICD-10-CM | POA: Diagnosis not present

## 2021-12-01 DIAGNOSIS — R29818 Other symptoms and signs involving the nervous system: Secondary | ICD-10-CM

## 2021-12-01 DIAGNOSIS — D6859 Other primary thrombophilia: Secondary | ICD-10-CM | POA: Diagnosis not present

## 2021-12-01 DIAGNOSIS — I130 Hypertensive heart and chronic kidney disease with heart failure and stage 1 through stage 4 chronic kidney disease, or unspecified chronic kidney disease: Secondary | ICD-10-CM

## 2021-12-01 MED ORDER — MINOXIDIL 10 MG PO TABS: 20.0000 mg | ORAL_TABLET | Freq: Two times a day (BID) | ORAL | 2 refills | Status: DC

## 2021-12-01 NOTE — Patient Instructions (Signed)
Medication Instructions:  Continue your current medications .  We will consider adjusting your fluid pills based on your lab  work.   *If you need a refill on your cardiac medications before your next appointment, please call your pharmacy*   Lab Work: Your physician recommends that you return for lab work today: ProBNP, magnesium, CMP  If you have labs (blood work) drawn today and your tests are completely normal, you will receive your results only by: MyChart Message (if you have MyChart) OR A paper copy in the mail If you have any lab test that is abnormal or we need to change your treatment, we will call you to review the results.   Testing/Procedures: None ordered today.    Follow-Up: At [facility], you and your health needs are our priority.  As part of our continuing mission to provide you with exceptional heart care, we have created designated Provider Care Teams.  These Care Teams include your primary Cardiologist (physician) and Advanced Practice Providers (APPs -  Physician Assistants and Nurse Practitioners) who all work together to provide you with the care you need, when you need it.  We recommend signing up for the patient portal called "MyChart".  Sign up information is provided on this After Visit Summary.  MyChart is used to connect with patients for Virtual Visits (Telemedicine).  Patients are able to view lab/test results, encounter notes, upcoming appointments, etc.  Non-urgent messages can be sent to your provider as well.   To learn more about what you can do with MyChart, go to [email or web address].    Your next appointment:   3 month(s)  The format for your next appointment:   In Person  Provider:   Elouise Munroe, MD  or Advanced Practice Provider   Other Instructions  Recommend restricting to less than 64 oz of fluid per day.   To prevent or reduce lower extremity swelling: Eat a low salt diet. Salt makes the body hold onto extra fluid  which causes swelling. Sit with legs elevated. For example, in the recliner or on an [place].  Wear knee-high compression stockings during the daytime. Ones labeled 15-20 mmHg provide good compression.   Recommend weighing daily and keeping a log. Please call our office if you have weight gain of 2 pounds overnight or 5 pounds in 1 week.   Date  Time Weight

## 2021-12-01 NOTE — Progress Notes (Signed)
Office Visit    Patient Name: Richard Barton Date of Encounter: 12/01/2021  PCP:  Haywood Pao, MD   [facility]  Cardiologist:  Elouise Munroe, MD  Advanced Practice Provider:  No care team member to display Electrophysiologist:  None      Chief Complaint    Richard Barton is a 66 y.o. male with a hx of left atrial myxoma with resection 2012, atrial flutter on Eliquis, HTN, HLD, severe LVH, Dm2, CKD presents today for heart failure follow up.    Past Medical History    Past Medical History:  Diagnosis Date   Atrial flutter ([place])    Atrial myxoma    Left   Benign neoplasm of heart 08/07/2011   Chronic right shoulder pain 02/14/2018   Chronic right-sided low back pain with right-sided sciatica 02/14/2018   Diabetes mellitus type II    DM2 (diabetes mellitus, type 2) (HCC)    HTN (hypertension)    Hyperlipidemia    Renal insufficiency    Unspecified pleural effusion 08/07/2011   Past Surgical History:  Procedure Laterality Date   CARDIAC CATHETERIZATION     CARDIOVERSION N/A 08/01/2021   Procedure: CARDIOVERSION;  Surgeon: Lelon Perla, MD;  Location: [facility] ENDOSCOPY;  Service: Cardiovascular;  Laterality: N/A;   cardioversion of atrial flutter.  07/31/11   Right miniature thoracotomy for resection of left atrial myxoma.  06/09/11   Dr Roxy Manns    Allergies  No Known Allergies  History of Present Illness    Richard Barton is a 66 y.o. male with a hx of left atrial myxoma with resection 2012, atrial flutter on Eliquis, HTN, HLD, severe LVH, Dm2, CKD last seen 11/02/21.  Seen 11/02/21 by Dr. Harriet Masson due to volume overload, weight gain, shortness of breath. Significant edema noted on exam. He was transitioned from Lasix to Torsemide 40mg  BID. Metolazone 5mg  was used for 3 days prior to Torsemide. EKG showed NSR 69 bpm with first degree AV block and nonspecific IVCD. Lab work 11/02/21: NT-ProBNP 341, mag 2.2, creat 1.58, K4, GFR 49.  He presents  today for follow up. His weight is down 3 pounds compared to previous visit. Tells me he was down to 245 pounds right after seeing Dr. Harriet Masson but is back up. Tells me he felt fatigued and weak when he took Metolazone. Medication reconcilliation markedly confusing as he did not bring pill packs with him and believes he may still be taking Metolazone. However, noted only increased urination for 3 days so suspect only took for 3 days. He drinks once cup of coffee in the morning then four or  five 32oz glasses of water with ice. He was unaware of fluid restriction. Tells me if he does not drink much he does not pee and has been on Flomax for a number of years. Per his report nephrology reduced his Amlodipine last week based on lab work that his blood pressure was too low. No lightheadedness, dizziness, near syncope, syncope. SBP at home 145-150. Eats mostly at home. Does not add salt when cooking but does eat sausage, bacon, or ham every moring for breakfast every morning.   EKGs/Labs/Other Studies Reviewed:   The following studies were reviewed today:  Echo 07/29/21 1. Left ventricular ejection fraction, by estimation, is 50 to 55%. The  left ventricle has low normal function. The left ventricle has no regional  wall motion abnormalities. There is moderate left ventricular hypertrophy.  Left ventricular diastolic  parameters are indeterminate.   2. Right ventricular systolic function is mildly reduced. The right  ventricular size is normal. There is normal pulmonary artery systolic  pressure.   3. The mitral valve is normal in structure. Trivial mitral valve  regurgitation.   4. The aortic valve was not well visualized. Aortic valve regurgitation  is not visualized. No aortic stenosis is present.   5. The inferior vena cava is dilated in size with >50% respiratory  variability, suggesting right atrial pressure of 8 mmHg.   EKG:  No EKG today.   Recent Labs: 07/28/2021: B Natriuretic Peptide  111.2 07/29/2021: Hemoglobin 14.9; Platelets 221; TSH 1.171 11/02/2021: BUN 14; Creatinine, Ser 1.58; Magnesium 2.2; NT-Pro BNP 341; Potassium 4.0; Sodium 142  Recent Lipid Panel    Component Value Date/Time   CHOL 91 07/29/2021 0339   TRIG 86 07/29/2021 0339   HDL 26 (L) 07/29/2021 0339   CHOLHDL 3.5 07/29/2021 0339   VLDL 17 07/29/2021 0339   LDLCALC 48 07/29/2021 0339    Risk Assessment/Calculations:   CHA2DS2-VASc Score = 3   This indicates a 3.2% annual risk of stroke. The patient's score is based upon: CHF History: 1 HTN History: 1 Diabetes History: 1 Stroke History: 0 Vascular Disease History: 0 Age Score: 0 Gender Score: 0   Home Medications   Current Meds  Medication Sig   amLODipine (NORVASC) 5 MG tablet Take 5 mg by mouth daily.   apixaban (ELIQUIS) 5 MG TABS tablet Take 1 tablet (5 mg total) by mouth 2 (two) times daily.   benazepril (LOTENSIN) 40 MG tablet Take 40 mg by mouth daily.     Continuous Blood Gluc Sensor (FREESTYLE LIBRE 2 SENSOR) MISC    desoximetasone (TOPICORT) 0.05 % cream Apply topically 2 (two) times daily.     empagliflozin (JARDIANCE) 10 MG TABS tablet Take 1 tablet (10 mg total) by mouth daily.   esomeprazole (NEXIUM) 40 MG capsule Take 40 mg by mouth daily before breakfast.     fluticasone furoate-vilanterol (BREO ELLIPTA) 200-25 MCG/ACT AEPB Inhale 1 puff into the lungs as needed.   HUMALOG KWIKPEN 200 UNIT/ML SOPN Inject 25 Units as directed 2 (two) times daily.   Insulin Glargine (BASAGLAR KWIKPEN) 100 UNIT/ML SOPN Inject 45 Units into the skin 2 (two) times daily.   latanoprost (XALATAN) 0.005 % ophthalmic solution Place 1 drop into both eyes at bedtime.   minoxidil (LONITEN) 10 MG tablet Take 2 tablets (20 mg total) by mouth 2 (two) times daily.   nebivolol (BYSTOLIC) 10 MG tablet Take 1 tablet (10 mg total) by mouth daily.   rosuvastatin (CRESTOR) 10 MG tablet Take 10 mg by mouth daily.   sitaGLIPtin (JANUVIA) 100 MG tablet Take 100  mg by mouth daily.     tamsulosin (FLOMAX) 0.4 MG CAPS capsule Take 0.4 mg by mouth daily.   timolol (BETIMOL) 0.5 % ophthalmic solution Place 1 drop into both eyes 2 (two) times daily.   Torsemide 40 MG TABS Take 40 mg by mouth in the morning and at bedtime.     Review of Systems      All other systems reviewed and are otherwise negative except as noted above.  Physical Exam    VS:  BP (!) 158/90    Pulse 75    Ht 5\' 11"  (1.803 m)    Wt 258 lb (117 kg)    SpO2 94%    BMI 35.98 kg/m  , BMI Body mass index is 35.98 kg/m.  Wt Readings from Last 3 Encounters:  12/01/21 258 lb (117 kg)  11/02/21 261 lb (118.4 kg)  08/11/21 252 lb 12.8 oz (114.7 kg)    GEN: Well nourished, well developed, in no acute distress. HEENT: normal. Neck: Supple, no JVD, carotid bruits, or masses. Cardiac: RRR, no murmurs, rubs, or gallops. No clubbing, cyanosis. Trace bilateral pedal edema.  Radials/PT 2+ and equal bilaterally.  Respiratory:  Respirations regular and unlabored, clear to auscultation bilaterally. GI: Soft, nontender, nondistended. MS: No deformity or atrophy. Skin: Warm and dry, no rash. Neuro:  Strength and sensation are intact. Psych: Normal affect.  Assessment & Plan    Paroxysmal atrial flutter / Chronic anticoagulation - Denies palpitations. Continue Bystolic at current dose. Denies bleeding complications on Eliquis. CHA2DS2-VASc Score = 3 [CHF History: 1, HTN History: 1, Diabetes History: 1, Stroke History: 0, Vascular Disease History: 0, Age Score: 0, Gender Score: 0].  Therefore, the patient's annual risk of stroke is 3.2 %.      Chronic diastolic heart failure / LVH - GDMT includes Jardiance, Bystolic, Torsemide. Drinking 160 oz or more per day. Long discussion regarding education to reduce to <64 oz fluid per day. Encouraged to follow low salt diet - notes eating ham/bacon daily for breakfast. Need to update ProBNP, BMP, magnesium prior to adjusting diuretic dose. Weight as low as  245 lbs at home but up to 258 lbs. Dry weight likely 252 lbs based on previous office notes. Daily weights encouraged. Given confusion regarding medications and CKD, Metolazone is likely not a good long term agent for him.   HLD - Continue Rosuvastatin 10mg  QD. Does report some myalgias more concerning for muscle cramp from electrolyte abnormality in setting of increased diuretic. CMP, mag today.   CKD / HTN - Careful titration of diuretic and antihypertensive.  Baseline creatinine 1.7-1.9. Follows with Dr. Moshe [facility]. Per their most recent note 07/2021 Minoxidil was decreased to 10mg  BID though our records state 20mg  BID. He reports nephrology recently reduced Amlodipine to 5mg  QD due to 'over controlled BP'. Difficult to ascertain which medications he is taking. Encouraged to bring pills to next clinic visit. BP not at goal of <130/80 today. Will defer changes as he reports recent changes to BP medications by nephrology.   Left atrial myxoma - s/p excision.  Suspected OSA - Sleep study previously ordered. Will reach out to sleep pool team to assess status. Suspect untreated OSA contributory to fluid retention and hypertension.   Disposition: Follow up in 3 month(s) with Elouise Munroe, MD or APP.  Signed, Loel Dubonnet, NP 12/01/2021, 2:42 PM [place]

## 2021-12-02 ENCOUNTER — Telehealth (HOSPITAL_BASED_OUTPATIENT_CLINIC_OR_DEPARTMENT_OTHER): Payer: Self-pay

## 2021-12-02 LAB — COMPREHENSIVE METABOLIC PANEL
ALT: 26 IU/L (ref 0–44)
AST: 29 IU/L (ref 0–40)
Albumin/Globulin Ratio: 1.7 (ref 1.2–2.2)
Albumin: 4.8 g/dL (ref 3.8–4.8)
Alkaline Phosphatase: 65 IU/L (ref 44–121)
BUN/Creatinine Ratio: 15 (ref 10–24)
BUN: 27 mg/dL (ref 8–27)
Bilirubin Total: 0.8 mg/dL (ref 0.0–1.2)
CO2: 30 mmol/L — ABNORMAL HIGH (ref 20–29)
Calcium: 9.9 mg/dL (ref 8.6–10.2)
Chloride: 99 mmol/L (ref 96–106)
Creatinine, Ser: 1.79 mg/dL — ABNORMAL HIGH (ref 0.76–1.27)
Globulin, Total: 2.8 g/dL (ref 1.5–4.5)
Glucose: 121 mg/dL — ABNORMAL HIGH (ref 70–99)
Potassium: 3.8 mmol/L (ref 3.5–5.2)
Sodium: 142 mmol/L (ref 134–144)
Total Protein: 7.6 g/dL (ref 6.0–8.5)
eGFR: 42 mL/min/{1.73_m2} — ABNORMAL LOW (ref >59)

## 2021-12-02 LAB — PRO B NATRIURETIC PEPTIDE: NT-Pro BNP: 248 pg/mL — ABNORMAL HIGH (ref 0–210)

## 2021-12-02 LAB — MAGNESIUM: Magnesium: 2.4 mg/dL — ABNORMAL HIGH (ref 1.6–2.3)

## 2021-12-02 MED ORDER — POTASSIUM CHLORIDE CRYS ER 10 MEQ PO TBCR: 20.0000 meq | EXTENDED_RELEASE_TABLET | Freq: Every day | ORAL | 6 refills | Status: DC

## 2021-12-02 MED ORDER — TORSEMIDE 40 MG PO TABS: 40.0000 mg | ORAL_TABLET | Freq: Two times a day (BID) | ORAL | 3 refills | Status: DC

## 2021-12-02 NOTE — Telephone Encounter (Signed)
Patient was returning call. Please advise ?

## 2021-12-02 NOTE — Telephone Encounter (Addendum)
Call attempt, no answer. Left message for patient to call back!    ----- Message from Loel Dubonnet, NP sent at 12/02/2021  7:42 AM EST ----- Normal liver function. ProBNP shows improvement in fluid status from 1 month ago. Kidney function stable.   Recommend increase Torsemide to 60mg  (1.5 tablets) in the AM and 40mg  in the evening for 3 days. Add potassium 96mEq daily x 3 days. Then return to Torsemide 40mg  BID. Please send Torsemide 40mg  BID to [place] for patient.  Remind to restrict to 64 oz fluid per day. Please assist to schedule follow up visit (3 mos with Dr. Margaretann Loveless or APP)

## 2021-12-02 NOTE — Addendum Note (Signed)
Addended by: Keldric Stabs on: 12/02/2021 04:23 PM   Modules accepted: Orders

## 2021-12-02 NOTE — Telephone Encounter (Signed)
Returned call to patient to review lab work, patient is pleased with results and agreeable to changes. Sending in refills to requested pharmacies!     ----- Message from Loel Dubonnet, NP sent at 12/02/2021  7:42 AM EST ----- Normal liver function. ProBNP shows improvement in fluid status from 1 month ago. Kidney function stable.    Recommend increase Torsemide to 60mg  (1.5 tablets) in the AM and 40mg  in the evening for 3 days. Add potassium 51mEq daily x 3 days. Then return to Torsemide 40mg  BID. Please send Torsemide 40mg  BID to [place] for patient.   Remind to restrict to 64 oz fluid per day. Please assist to schedule follow up visit (3 mos with Dr. Margaretann Loveless or APP)

## 2021-12-07 ENCOUNTER — Telehealth: Payer: Self-pay | Admitting: *Deleted

## 2021-12-07 DIAGNOSIS — R0683 Snoring: Secondary | ICD-10-CM

## 2021-12-07 DIAGNOSIS — G478 Other sleep disorders: Secondary | ICD-10-CM

## 2021-12-07 DIAGNOSIS — I483 Typical atrial flutter: Secondary | ICD-10-CM

## 2021-12-07 DIAGNOSIS — R29818 Other symptoms and signs involving the nervous system: Secondary | ICD-10-CM

## 2021-12-07 NOTE — Telephone Encounter (Signed)
Prior Authorization for sleep study sent to [facility] via web portal. Tracking Number [id number].

## 2021-12-07 NOTE — Telephone Encounter (Signed)
-----   Message from Trevione Stabs, RN sent at 12/06/2021  8:12 AM EST ----- Hi, I was just reaching out to see if this patient has been approved and can get scheduled for his sleep study!

## 2021-12-12 ENCOUNTER — Other Ambulatory Visit: Payer: Self-pay | Admitting: Cardiology

## 2021-12-12 DIAGNOSIS — I483 Typical atrial flutter: Secondary | ICD-10-CM

## 2021-12-12 DIAGNOSIS — I10 Essential (primary) hypertension: Secondary | ICD-10-CM

## 2021-12-12 DIAGNOSIS — I5033 Acute on chronic diastolic (congestive) heart failure: Secondary | ICD-10-CM

## 2021-12-12 DIAGNOSIS — R29818 Other symptoms and signs involving the nervous system: Secondary | ICD-10-CM

## 2021-12-12 NOTE — Telephone Encounter (Signed)
Received a call from [facility] denying in lab split night sleep study. No co morbidities. HST ordered and scheduled for 01/04/22. Message left on patient's voice mail.

## 2021-12-12 NOTE — Telephone Encounter (Signed)
Received a call from [facility] today with a denial for requested in lab sleep study. Will notify ordering provider. HST will need to be ordered.

## 2021-12-12 NOTE — Telephone Encounter (Signed)
Home sleep study ordered with assistance of Mariann Laster. Very appreciative of all of her assistance!  Loel Dubonnet, NP

## 2021-12-13 ENCOUNTER — Encounter (HOSPITAL_BASED_OUTPATIENT_CLINIC_OR_DEPARTMENT_OTHER): Payer: Self-pay

## 2021-12-16 ENCOUNTER — Other Ambulatory Visit: Payer: Self-pay | Admitting: *Deleted

## 2021-12-16 MED ORDER — TORSEMIDE 40 MG PO TABS: 40.0000 mg | ORAL_TABLET | Freq: Two times a day (BID) | ORAL | 3 refills | Status: DC

## 2021-12-19 ENCOUNTER — Telehealth: Payer: Self-pay

## 2021-12-19 NOTE — Telephone Encounter (Signed)
[place] based off a fax we received. Prescription was sent on 1/13 and 1/27. She states she will discontinue the first prescription and keep the second one. No further action required at this time.

## 2021-12-20 ENCOUNTER — Encounter (HOSPITAL_BASED_OUTPATIENT_CLINIC_OR_DEPARTMENT_OTHER): Payer: Self-pay

## 2021-12-20 NOTE — Telephone Encounter (Signed)
Please advise 

## 2021-12-30 ENCOUNTER — Telehealth (HOSPITAL_BASED_OUTPATIENT_CLINIC_OR_DEPARTMENT_OTHER): Payer: Self-pay

## 2021-12-30 NOTE — Telephone Encounter (Signed)
Calling patient to follow up on mychart message   "Good morning  . I'm still not feeling well.  Shortness of breath even when Just sitting.  Some heaviness in my chest and just tired"    Patient endorses heaviness in the chest and shortness of breath. He feels like he is "half-full" Happens at rest and up doing something. His heart feels fluttery every once and a while. He says this has been happening for about a week. He says that his fluid has been picking back up Dry Weight 249 and is up to 262 yesterday!   Was seen in the office yesterday for his diabetes.   Patient advised to be seen in the ED due to increased fluids, chest heaviness and ongoing shortness of breath!   Reviewed [facility] with the patient who verbalizes understanding!

## 2022-01-04 ENCOUNTER — Ambulatory Visit (HOSPITAL_BASED_OUTPATIENT_CLINIC_OR_DEPARTMENT_OTHER): Payer: 59 | Attending: Cardiology | Admitting: Cardiovascular Disease

## 2022-01-04 DIAGNOSIS — G4730 Sleep apnea, unspecified: Secondary | ICD-10-CM | POA: Insufficient documentation

## 2022-01-04 DIAGNOSIS — I483 Typical atrial flutter: Secondary | ICD-10-CM

## 2022-01-04 DIAGNOSIS — G4733 Obstructive sleep apnea (adult) (pediatric): Secondary | ICD-10-CM

## 2022-01-04 DIAGNOSIS — R29818 Other symptoms and signs involving the nervous system: Secondary | ICD-10-CM

## 2022-01-04 DIAGNOSIS — I5033 Acute on chronic diastolic (congestive) heart failure: Secondary | ICD-10-CM

## 2022-01-04 DIAGNOSIS — I10 Essential (primary) hypertension: Secondary | ICD-10-CM

## 2022-01-16 ENCOUNTER — Other Ambulatory Visit: Payer: Self-pay

## 2022-01-16 ENCOUNTER — Telehealth: Payer: Self-pay | Admitting: Internal Medicine

## 2022-01-16 DIAGNOSIS — I5033 Acute on chronic diastolic (congestive) heart failure: Secondary | ICD-10-CM

## 2022-01-16 MED ORDER — METOLAZONE 5 MG PO TABS: 5.0000 mg | ORAL_TABLET | Freq: Every day | ORAL | 0 refills | Status: DC

## 2022-01-16 NOTE — Telephone Encounter (Signed)
°  Per MyChart scheduling message:  My legs and feet are swollen and I feel as if I'm swollen up to my neck.  I  have not used salt but I have continued to steady gain weight.  I have been as low as 245 and I am now approximately 270 this is over the last 4 weeks.  I do check my weight every morning first thing but I don't record it.  I just checked to see if it is up or down.  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 I do wear a fitness tracker and it continues to tell me I have a irregular heart beat. I'm not in pain but I just can't breathe.  I have to rest and get my breath back after going from room to room sometimes. I am currently taking 40 mg. Of torsemide 2 times daily.  Also if I stand or bend over the pressure in my head feels like it could pop.

## 2022-01-16 NOTE — Telephone Encounter (Signed)
Patient reports retaining fluid and has put on 25 pounds over the past month. He is sob over the phone. He weighs daily, but doesn't record the weight. He voiced he understood to call office with weight gain of 2 pounds in a day or 5 pounds in a week, but he never contacted office. Current bp 153/71, p 67. Dr. Harriet Masson ordered zaroxolyn 5 mg daily for five days, bmet and mg this Thursday or Friday and appointment with app. Orders placed and patient informed. He voiced understanding of orders and appointment with Thomasene Mohair on 3/8.

## 2022-01-18 NOTE — Progress Notes (Signed)
Cardiology Office Note:    Date:  01/18/2022   ID:  Richard Barton, DOB Mar 08, 1956, MRN [id number]  PCP:  Haywood Pao, MD   [place] Providers Cardiologist:  Elouise Munroe, MD      Referring MD: Haywood Pao, MD   Follow-up for lower extremity edema and acute on chronic diastolic CHF.  History of Present Illness:    Richard Barton is a 66 y.o. male with a hx of left atrial myxoma, hypertensive heart and kidney disease, chronic diastolic CHF, atrial flutter, CKD stage III, and hyperlipidemia.  He said minute MyChart message which was reviewed on 01/16/2022.  He reported a 25 pound weight increase over 4 weeks.  He reported he was taking his 40 mg torsemide twice daily as prescribed.  He was contacted by nursing staff who recorded a blood pressure of 153/71 and a pulse of 67.  He was prescribed Zaroxolyn 5 mg daily for 5 days.  His BMP showed a creatinine increased to 2.89.  Baseline creatinine around 1.58-1.79.  He was contacted by Dr. Johney Frame on 01/21/2022 to review lab results.  He reported that his weight had decreased.  His renal function was reviewed.  Decreased renal function was expected in the setting of metolazone.  It was recommended that he hold his torsemide until Tuesday.  His benzapril was also placed on hold.  P.o. magnesium was recommended for muscle cramps.  He presents to the clinic today for follow-up evaluation and states he is feeling much better .  He did note some aches in his shoulders and hips during Zaroxolyn treatment.  He also reports that his blood pressure came down to 90s over 60s/70s.  His blood pressure has been better since holding his benazepril.  We reviewed the importance of avoiding salt, fluid restriction, restart his torsemide, restart his benazepril, and plan follow-up for 3 to 4 months.  Today he denies chest pain, shortness of breath, lower extremity edema, fatigue, palpitations, melena, hematuria, hemoptysis, diaphoresis,  weakness, presyncope, syncope, orthopnea, and PND.    Past Medical History:  Diagnosis Date   Atrial flutter ([place])    Atrial myxoma    Left   Benign neoplasm of heart 08/07/2011   Chronic right shoulder pain 02/14/2018   Chronic right-sided low back pain with right-sided sciatica 02/14/2018   Diabetes mellitus type II    DM2 (diabetes mellitus, type 2) (HCC)    HTN (hypertension)    Hyperlipidemia    Renal insufficiency    Unspecified pleural effusion 08/07/2011    Past Surgical History:  Procedure Laterality Date   CARDIAC CATHETERIZATION     CARDIOVERSION N/A 08/01/2021   Procedure: CARDIOVERSION;  Surgeon: Lelon Perla, MD;  Location: [facility] ENDOSCOPY;  Service: Cardiovascular;  Laterality: N/A;   cardioversion of atrial flutter.  07/31/11   Right miniature thoracotomy for resection of left atrial myxoma.  06/09/11   Dr Roxy Manns    Current Medications: No outpatient medications have been marked as taking for the 01/25/22 encounter (Appointment) with Deberah Pelton, NP.     Allergies:   Patient has no known allergies.   Social History   Socioeconomic History   Marital status: Married    Spouse name: Not on file   Number of children: Not on file   Years of education: Not on file   Highest education level: Not on file  Occupational History   Occupation: IT sales professional: SELF-EMPLOYED  Tobacco Use   Smoking  status: Former    Types: Cigarettes    Quit date: 11/20/2006    Years since quitting: 15.1   Smokeless tobacco: Never  Vaping Use   Vaping Use: Never used  Substance and Sexual Activity   Alcohol use: No   Drug use: No   Sexual activity: Not on file  Other Topics Concern   Not on file  Social History Narrative   Not on file   Social Determinants of Health   Financial Resource Strain: Not on file  Food Insecurity: Not on file  Transportation Needs: Not on file  Physical Activity: Not on file  Stress: Not on file  Social Connections: Not on file      Family History: The patient's family history includes CAD in his father.  ROS:   Please see the history of present illness.     All other systems reviewed and are negative.   Risk Assessment/Calculations:           Physical Exam:    VS:  There were no vitals taken for this visit.    Wt Readings from Last 3 Encounters:  01/04/22 259 lb (117.5 kg)  12/01/21 258 lb (117 kg)  11/02/21 261 lb (118.4 kg)     GEN:  Well nourished, well developed in no acute distress HEENT: Normal NECK: No JVD; No carotid bruits LYMPHATICS: No lymphadenopathy CARDIAC: RRR, no murmurs, rubs, gallops, generalized bilateral lower extremity nonpitting edema RESPIRATORY:  Clear to auscultation without rales, wheezing or rhonchi  ABDOMEN: Soft, non-tender, non-distended MUSCULOSKELETAL:  No edema; No deformity  SKIN: Warm and dry NEUROLOGIC:  Alert and oriented x 3 PSYCHIATRIC:  Normal affect    EKGs/Labs/Other Studies Reviewed:    The following studies were reviewed today:  Echocardiogram 07/29/2021  IMPRESSIONS     1. Left ventricular ejection fraction, by estimation, is 50 to 55%. The  left ventricle has low normal function. The left ventricle has no regional  wall motion abnormalities. There is moderate left ventricular hypertrophy.  Left ventricular diastolic  parameters are indeterminate.   2. Right ventricular systolic function is mildly reduced. The right  ventricular size is normal. There is normal pulmonary artery systolic  pressure.   3. The mitral valve is normal in structure. Trivial mitral valve  regurgitation.   4. The aortic valve was not well visualized. Aortic valve regurgitation  is not visualized. No aortic stenosis is present.   5. The inferior vena cava is dilated in size with >50% respiratory  variability, suggesting right atrial pressure of 8 mmHg.  EKG: None today.  Recent Labs: 07/28/2021: B Natriuretic Peptide 111.2 07/29/2021: Hemoglobin 14.9; Platelets 221;  TSH 1.171 12/01/2021: ALT 26; BUN 27; Creatinine, Ser 1.79; Magnesium 2.4; NT-Pro BNP 248; Potassium 3.8; Sodium 142  Recent Lipid Panel    Component Value Date/Time   CHOL 91 07/29/2021 0339   TRIG 86 07/29/2021 0339   HDL 26 (L) 07/29/2021 0339   CHOLHDL 3.5 07/29/2021 0339   VLDL 17 07/29/2021 0339   LDLCALC 48 07/29/2021 0339    ASSESSMENT & PLAN    Chronic diastolic CHF-weight today 260.  Did note some cramping with prescribed Zaroxolyn dosing.  Euvolemic today. Continue torsemide, Jardiance, potassium Heart healthy low-sodium diet-salty 6 given Increase physical activity as tolerated Daily weights-contact office with a weight increase of 2 pounds overnight or 5 pounds in 1 week Elevate lower extremities when not active Lower extremity support stockings-[place] support stocking sheet given. Fluid restriction 48 to 64 ounces Repeat  BMP  Essential hypertension-BP today 138/74.  Better controlled at home with being euvolemic. Continue amlodipine,  nebivolol  Restart Benazepril,torsemide Heart healthy low-sodium diet Increase physical activity as tolerated  Atrial flutter-heart rate today 71.  Cardiac unaware.  No recent episodes of dizziness presyncope or syncope.  Reports compliance with apixaban and denies bleeding issues. Continue nebivolol, apixaban Heart healthy low-sodium diet Increase physical activity as tolerated Avoid triggers caffeine, chocolate, EtOH, dehydration etc.  Hyperlipidemia-07/29/2021: Cholesterol 91; HDL 26; LDL Cholesterol 48; Triglycerides 86; VLDL 17 Continue rosuvastatin Heart healthy low-sodium high-fiber diet Increase physical activity as tolerated  Disposition: Follow-up with Dr. Margaretann Loveless or APP in 3-4 months.       Medication Adjustments/Labs and Tests Ordered: Current medicines are reviewed at length with the patient today.  Concerns regarding medicines are outlined above.  No orders of the defined types were placed in this  encounter.  No orders of the defined types were placed in this encounter.   There are no Patient Instructions on file for this visit.   Signed, Deberah Pelton, NP  01/18/2022 7:11 AM      Notice: This dictation was prepared with Dragon dictation along with smaller phrase technology. Any transcriptional errors that result from this process are unintentional and may not be corrected upon review.  I spent 14 minutes examining this patient, reviewing medications, and using patient centered shared decision making involving her cardiac care.  Prior to her visit I spent greater than 20 minutes reviewing her past medical history,  medications, and prior cardiac tests.

## 2022-01-20 ENCOUNTER — Encounter (HOSPITAL_BASED_OUTPATIENT_CLINIC_OR_DEPARTMENT_OTHER): Payer: Self-pay | Admitting: Cardiovascular Disease

## 2022-01-20 LAB — MAGNESIUM: Magnesium: 2.2 mg/dL (ref 1.6–2.3)

## 2022-01-20 LAB — BASIC METABOLIC PANEL
BUN/Creatinine Ratio: 14 (ref 10–24)
BUN: 41 mg/dL — ABNORMAL HIGH (ref 8–27)
CO2: 34 mmol/L — ABNORMAL HIGH (ref 20–29)
Calcium: 9.8 mg/dL (ref 8.6–10.2)
Chloride: 89 mmol/L — ABNORMAL LOW (ref 96–106)
Creatinine, Ser: 2.89 mg/dL — ABNORMAL HIGH (ref 0.76–1.27)
Glucose: 159 mg/dL — ABNORMAL HIGH (ref 70–99)
Potassium: 3.9 mmol/L (ref 3.5–5.2)
Sodium: 140 mmol/L (ref 134–144)
eGFR: 24 mL/min/{1.73_m2} — ABNORMAL LOW (ref >59)

## 2022-01-20 NOTE — Procedures (Signed)
° ° ° ° ° °  Patient Name: Richard Barton, Richard Barton Date: 01/04/2022 Gender: Male D.O.B: Jul 15, 1956 Age (years): 84 Referring Provider: Godfrey Pick Tobb DO Height (inches): 71 Interpreting Physician: Shelva Majestic MD, ABSM Weight (lbs): 261 RPSGT: Jacolyn Reedy BMI: 36 MRN: [id number] Neck Size: 17.50  CLINICAL INFORMATION Sleep Study Type: HST  Indication for sleep study: snoring, daytime somnolence  Epworth Sleepiness Score: 9  SLEEP STUDY TECHNIQUE A multi-channel overnight portable sleep study was performed. The channels recorded were: nasal airflow, thoracic respiratory movement, and oxygen saturation with a pulse oximetry. Snoring was also monitored.  MEDICATIONS amLODipine (NORVASC) 5 MG tablet apixaban (ELIQUIS) 5 MG TABS tablet benazepril (LOTENSIN) 40 MG tablet Continuous Blood Gluc Sensor (FREESTYLE LIBRE 2 SENSOR) MISC desoximetasone (TOPICORT) 0.05 % cream empagliflozin (JARDIANCE) 10 MG TABS tablet esomeprazole (NEXIUM) 40 MG capsule fluticasone furoate-vilanterol (BREO ELLIPTA) 200-25 MCG/ACT AEPB HUMALOG KWIKPEN 200 UNIT/ML SOPN Insulin Glargine (BASAGLAR KWIKPEN) 100 UNIT/ML SOPN latanoprost (XALATAN) 0.005 % ophthalmic solution metolazone (ZAROXOLYN) 5 MG tablet minoxidil (LONITEN) 10 MG tablet nebivolol (BYSTOLIC) 10 MG tablet potassium chloride SA (KLOR-CON M) 10 MEQ tablet rosuvastatin (CRESTOR) 10 MG tablet sitaGLIPtin (JANUVIA) 100 MG tablet tamsulosin (FLOMAX) 0.4 MG CAPS capsule timolol (BETIMOL) 0.5 % ophthalmic solution Patient self administered medications include: N/A.  SLEEP ARCHITECTURE Patient was studied for 358 minutes. The sleep efficiency was 100.0 % and the patient was supine for 4.7%. The arousal index was 0.0 per hour.  RESPIRATORY PARAMETERS The overall AHI was 40.2 per hour, with a central apnea index of 0 per hour.  The oxygen nadir was 72% during sleep.  CARDIAC DATA Mean heart rate during sleep was 61.4  bpm.  IMPRESSIONS - Severe obstructive sleep apnea occurred during this study (AHI  40.2/h). - Severe oxygen desaturation to a nadir of 72%. - Patient snored 133.0 minutes (37.2%)  during the sleep.  DIAGNOSIS - Obstructive Sleep Apnea (G47.33) - Nocturnal Hypoxemia (G47.36)  RECOMMENDATIONS - In this patient with significant cardiovascular co-morbidities, severe sleep apnea and significant oxygen desaturation, recommend an in-lab CPAP titration evaluation, If unableto schedule initiate Auto-PAP with EPR of 3 at 7 - 20 cm of water. - Effprt should be made to optimize nasal and oropharyngeal patency. - Avoid alcohol, sedatives and other CNS depressants that may worsen sleep apnea and disrupt normal sleep architecture. - Sleep hygiene should be reviewed to assess factors that may improve sleep quality. - Weight management and regular exercise should be initiated or continued. - Recommend a download and sleep clinic evaluation after one month of therapy.   [Electronically signed] 01/20/2022 01:42 PM  Shelva Majestic MD, [facility], [place], American Board of Sleep Medicine   NPI: [id number]  [place] PH: [phone]   FX: [phone] [place]

## 2022-01-21 NOTE — Telephone Encounter (Signed)
Covering for Dr. Harriet Masson and called and spoke to the patient about his lab results. Volume status improved. Renal function worsening after metolazone (has completed 5 day course). Recommended holding the torsemide until Tuesday. Will also hold the benzapril until re-evaluated in clinic on Wednesday this week. Will need repeat labs to reassess renal function and electrolytes. Recommended some PO Mg for muscle cramps.  ? ?Gwyndolyn Kaufman, MD ?

## 2022-01-25 ENCOUNTER — Ambulatory Visit (HOSPITAL_BASED_OUTPATIENT_CLINIC_OR_DEPARTMENT_OTHER): Payer: 59 | Admitting: General Practice

## 2022-01-25 ENCOUNTER — Encounter (HOSPITAL_BASED_OUTPATIENT_CLINIC_OR_DEPARTMENT_OTHER): Payer: Self-pay | Admitting: General Practice

## 2022-01-25 ENCOUNTER — Other Ambulatory Visit: Payer: Self-pay

## 2022-01-25 VITALS — BP 138/74 | HR 71 | Ht 71.0 in | Wt 260.2 lb

## 2022-01-25 DIAGNOSIS — I5033 Acute on chronic diastolic (congestive) heart failure: Secondary | ICD-10-CM

## 2022-01-25 DIAGNOSIS — E785 Hyperlipidemia, unspecified: Secondary | ICD-10-CM

## 2022-01-25 DIAGNOSIS — I483 Typical atrial flutter: Secondary | ICD-10-CM | POA: Diagnosis not present

## 2022-01-25 DIAGNOSIS — I10 Essential (primary) hypertension: Secondary | ICD-10-CM | POA: Diagnosis not present

## 2022-01-25 MED ORDER — BENAZEPRIL HCL 40 MG PO TABS: 40.0000 mg | ORAL_TABLET | Freq: Every day | ORAL | 1 refills | Status: DC

## 2022-01-25 MED ORDER — TORSEMIDE 40 MG PO TABS: 40.0000 mg | ORAL_TABLET | Freq: Two times a day (BID) | ORAL | 1 refills | Status: DC

## 2022-01-25 NOTE — Telephone Encounter (Signed)
Patient seen today 01/25/22- by Coletta Memos, NP  ?

## 2022-01-25 NOTE — Patient Instructions (Signed)
Medication Instructions:  ?Your physician recommends that you continue on your current medications as directed. Please refer to the Current Medication list given to you today. ? ?*If you need a refill on your cardiac medications before your next appointment, please call your pharmacy* ? ? ?Lab Work: ?Your physician recommends that you return for lab work today: BMET ? ?If you have labs (blood work) drawn today and your tests are completely normal, you will receive your results only by: ?MyChart Message (if you have MyChart) OR ?A paper copy in the mail ?If you have any lab test that is abnormal or we need to change your treatment, we will call you to review the results. ? ?Follow-Up: ?At [facility], you and your health needs are our priority.  As part of our continuing mission to provide you with exceptional heart care, we have created designated Provider Care Teams.  These Care Teams include your primary Cardiologist (physician) and Advanced Practice Providers (APPs -  Physician Assistants and Nurse Practitioners) who all work together to provide you with the care you need, when you need it. ? ?We recommend signing up for the patient portal called "MyChart".  Sign up information is provided on this After Visit Summary.  MyChart is used to connect with patients for Virtual Visits (Telemedicine).  Patients are able to view lab/test results, encounter notes, upcoming appointments, etc.  Non-urgent messages can be sent to your provider as well.   ?To learn more about what you can do with MyChart, go to [email or web address].   ? ?Your next appointment:   ?3-4 month(s) ? ?The format for your next appointment:   ?In Person ? ?Provider:   ?Elouise Munroe, MD ? ? ?Other Instructions ?Coletta Memos, NP has recommended continuing to weigh yourself daily. Please call our office if you have weight gain of 2 lbs overnight or 5 lbs in one week. Please use the weight log given to you today to record your weights and  bring this back with you to your follow-up appointments. ? ?

## 2022-01-26 LAB — BASIC METABOLIC PANEL
BUN/Creatinine Ratio: 15 (ref 10–24)
BUN: 25 mg/dL (ref 8–27)
CO2: 26 mmol/L (ref 20–29)
Calcium: 10.4 mg/dL — ABNORMAL HIGH (ref 8.6–10.2)
Chloride: 100 mmol/L (ref 96–106)
Creatinine, Ser: 1.62 mg/dL — ABNORMAL HIGH (ref 0.76–1.27)
Glucose: 97 mg/dL (ref 70–99)
Potassium: 3.9 mmol/L (ref 3.5–5.2)
Sodium: 142 mmol/L (ref 134–144)
eGFR: 47 mL/min/{1.73_m2} — ABNORMAL LOW (ref >59)

## 2022-01-27 ENCOUNTER — Other Ambulatory Visit: Payer: Self-pay | Admitting: Cardiovascular Disease

## 2022-01-27 ENCOUNTER — Telehealth: Payer: Self-pay | Admitting: *Deleted

## 2022-01-27 DIAGNOSIS — I10 Essential (primary) hypertension: Secondary | ICD-10-CM

## 2022-01-27 DIAGNOSIS — G4733 Obstructive sleep apnea (adult) (pediatric): Secondary | ICD-10-CM

## 2022-01-27 DIAGNOSIS — I5033 Acute on chronic diastolic (congestive) heart failure: Secondary | ICD-10-CM

## 2022-01-27 DIAGNOSIS — G4736 Sleep related hypoventilation in conditions classified elsewhere: Secondary | ICD-10-CM

## 2022-01-27 NOTE — Telephone Encounter (Signed)
Patient notified of HST results and recommendations. He agrees to proceed with CPAP titration if insurance allows. ?

## 2022-01-27 NOTE — Telephone Encounter (Signed)
-----   Message from Troy Sine, MD sent at 01/20/2022  1:48 PM EST ----- ?Mariann Laster, please notify pt; try for in-lab CPAP titration, if unable then Auto-PAP. ?

## 2022-02-08 ENCOUNTER — Telehealth: Payer: Self-pay | Admitting: *Deleted

## 2022-02-08 NOTE — Telephone Encounter (Signed)
Patient notified CPAP titration was denied by [facility]. Order has been sent to [facility] for [facility]. ?

## 2022-02-20 ENCOUNTER — Ambulatory Visit (HOSPITAL_BASED_OUTPATIENT_CLINIC_OR_DEPARTMENT_OTHER): Payer: 59 | Admitting: Family

## 2022-02-25 ENCOUNTER — Other Ambulatory Visit (HOSPITAL_BASED_OUTPATIENT_CLINIC_OR_DEPARTMENT_OTHER): Payer: Self-pay | Admitting: Family

## 2022-03-13 ENCOUNTER — Other Ambulatory Visit: Payer: Self-pay

## 2022-03-13 ENCOUNTER — Telehealth: Payer: Self-pay | Admitting: Internal Medicine

## 2022-03-13 DIAGNOSIS — I130 Hypertensive heart and chronic kidney disease with heart failure and stage 1 through stage 4 chronic kidney disease, or unspecified chronic kidney disease: Secondary | ICD-10-CM

## 2022-03-13 DIAGNOSIS — I5033 Acute on chronic diastolic (congestive) heart failure: Secondary | ICD-10-CM

## 2022-03-13 MED ORDER — POTASSIUM CHLORIDE CRYS ER 20 MEQ PO TBCR: 20.0000 meq | EXTENDED_RELEASE_TABLET | Freq: Every day | ORAL | 3 refills | Status: DC

## 2022-03-13 NOTE — Telephone Encounter (Signed)
Spouse concerned that patient didn't have enough potassium. Reordered K+ (20 mEq daily). Cautioned wife to check what the pharmacy is giving patient (changed tablets from 10 mEq BID to 20 mEq daily). Spouse said she would check the label when she picks up the medication. ?

## 2022-03-13 NOTE — Telephone Encounter (Signed)
Pt c/o medication issue: ? ?1. Name of Medication: potassium chloride SA (KLOR-CON M) 10 MEQ tablet  ? ?2. How are you currently taking this medication (dosage and times per day)? Patient taking 20 mEq (2 tablets daily) ? ?3. Are you having a reaction (difficulty breathing--STAT)?  ? ?4. What is your medication issue? The way the rx is written the patient only has enough medication to last 15 days. The patient's wife would like clarification as to how the patient is supposed to take the medication. Please advise  ?

## 2022-03-30 ENCOUNTER — Telehealth: Payer: Self-pay | Admitting: Internal Medicine

## 2022-03-30 NOTE — Telephone Encounter (Signed)
?*  STAT* If patient is at the pharmacy, call can be transferred to refill team. ? ? ?1. Which medications need to be refilled? (please list name of each medication and dose if known) Torsemide 40 MG TABS ? ?2. Which pharmacy/location (including street and city if local pharmacy) is medication to be sent to?  ?[facility] by [place], [place] ?3. Do they need a 30 day or 90 day supply?  ?90 day ? ?Pt's wife states that refill needs to go to this pharmacy to be put in his [facility]pack. Please advise ? ?

## 2022-03-31 MED ORDER — TORSEMIDE 40 MG PO TABS: 40.0000 mg | ORAL_TABLET | Freq: Two times a day (BID) | ORAL | 1 refills | Status: DC

## 2022-03-31 NOTE — Telephone Encounter (Signed)
Refill sent to pharmacy.   

## 2022-04-18 ENCOUNTER — Other Ambulatory Visit: Payer: Self-pay

## 2022-04-18 MED ORDER — TORSEMIDE 40 MG PO TABS: 40.0000 mg | ORAL_TABLET | Freq: Two times a day (BID) | ORAL | 1 refills | Status: DC

## 2022-04-19 ENCOUNTER — Other Ambulatory Visit: Payer: Self-pay

## 2022-04-19 MED ORDER — TORSEMIDE 40 MG PO TABS: 40.0000 mg | ORAL_TABLET | Freq: Two times a day (BID) | ORAL | 1 refills | Status: DC

## 2022-04-19 NOTE — Telephone Encounter (Signed)
Late entry: Received a prior auth request for [facility](?) after checking this a a alternate medication for pt's Torsemide. Per EPIC this was entered a torsemide '40mg'$  Tab BID, I do not know what happened.No prior auth needed.  Tried to call pharmacy they do not open until :830 am will call back then.

## 2022-04-19 NOTE — Telephone Encounter (Signed)
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 Informed per our computer EPIC system this rx was sent as Torsemide '40mg'$  BID. He will fill as Torsemide '40mg'$  BID. Verbalized understanding. New rx sent as well. Rx yesterday was sent to the incorrect pharmacy. Called [facility]. They will cancel this rx.

## 2022-04-21 ENCOUNTER — Telehealth: Payer: Self-pay | Admitting: Internal Medicine

## 2022-04-21 DIAGNOSIS — Z79899 Other long term (current) drug therapy: Secondary | ICD-10-CM

## 2022-04-21 NOTE — Telephone Encounter (Signed)
Pt c/o medication issue:  1. Name of Medication: Torsemide 40 MG TABS  2. How are you currently taking this medication (dosage and times per day)? Take 40 mg by mouth in the morning and at bedtime  3. Are you having a reaction (difficulty breathing--STAT)? no  4. What is your medication issue? Wife calling to say that the pharmacy cant filled the prescription at '40mg'$ . Its needs to be '20mg'$  taken four pills once a day. needed to be two pills in the morning and two at bedtime. A new prescription would need to be put in.please advise

## 2022-04-21 NOTE — Telephone Encounter (Signed)
Hey Dr.Acharya, Can you take a look at this? Patient was last seen by Coletta Memos, NP in March- the did BMET at this visit, and restarted Torsemide 40 mg BID. Metolazone was stopped previously due to kidney function- would you like to recheck a BMET before updating RX? Of Torsemide?  Just checking, thanks!

## 2022-04-24 MED ORDER — TORSEMIDE 20 MG PO TABS: 40.0000 mg | ORAL_TABLET | Freq: Two times a day (BID) | ORAL | 3 refills | Status: DC

## 2022-04-24 NOTE — Telephone Encounter (Signed)
Richard Munroe, MD  You; Kathreen Devoid, RN; [facility]; Caprice Beaver, LPN 1 hour ago (3:50 PM)   Please fill his script today. Cr looks like it's at his baseline at last check. Have him come in for a BMET this week but do not hold up torsemide refill for this. Please ensure it is written the way the wife had indicated in her initial message.  [place]    Spoke with patients wife and made her aware that new prescription has been sent to patient preferred pharmacy. Advised her to have patient come in this week for repeat BMET- order placed. Patients wife aware of all instructions and verbalized understanding.

## 2022-04-24 NOTE — Telephone Encounter (Signed)
Follow Up:    Wife is calling to find out the status of Patient's Torsemide. She said he will run out after today.Marland Kitchen

## 2022-04-24 NOTE — Telephone Encounter (Signed)
Please review and advise if patient should have a refill on torsemide or have BMET first.

## 2022-04-26 ENCOUNTER — Ambulatory Visit (INDEPENDENT_AMBULATORY_CARE_PROVIDER_SITE_OTHER): Payer: 59 | Admitting: Cardiovascular Disease

## 2022-04-26 ENCOUNTER — Encounter: Payer: Self-pay | Admitting: Cardiovascular Disease

## 2022-04-26 DIAGNOSIS — G4733 Obstructive sleep apnea (adult) (pediatric): Secondary | ICD-10-CM | POA: Diagnosis not present

## 2022-04-26 DIAGNOSIS — I483 Typical atrial flutter: Secondary | ICD-10-CM | POA: Diagnosis not present

## 2022-04-26 DIAGNOSIS — G4736 Sleep related hypoventilation in conditions classified elsewhere: Secondary | ICD-10-CM

## 2022-04-26 DIAGNOSIS — R29818 Other symptoms and signs involving the nervous system: Secondary | ICD-10-CM

## 2022-04-26 DIAGNOSIS — I517 Cardiomegaly: Secondary | ICD-10-CM

## 2022-04-26 DIAGNOSIS — I10 Essential (primary) hypertension: Secondary | ICD-10-CM

## 2022-04-26 DIAGNOSIS — E785 Hyperlipidemia, unspecified: Secondary | ICD-10-CM

## 2022-04-26 DIAGNOSIS — I5032 Chronic diastolic (congestive) heart failure: Secondary | ICD-10-CM

## 2022-04-26 MED ORDER — NEBIVOLOL HCL 10 MG PO TABS: 15.0000 mg | ORAL_TABLET | Freq: Every day | ORAL | 3 refills | Status: DC

## 2022-04-26 MED ORDER — AMLODIPINE BESYLATE 2.5 MG PO TABS: 2.5000 mg | ORAL_TABLET | Freq: Every day | ORAL | 3 refills | Status: AC

## 2022-04-26 NOTE — Progress Notes (Unsigned)
Cardiology Office Note    Date:  04/26/2022   ID:  Richard Barton, DOB 08-May-1956, MRN [id number]  PCP:  Haywood Pao, MD  Cardiologist:  Shelva Majestic, MD   New sleep consult   History of Present Illness:  Richard Barton is a 66 y.o. male ***    Past Medical History:  Diagnosis Date   Atrial flutter ([place])    Atrial myxoma    Left   Benign neoplasm of heart 08/07/2011   Chronic right shoulder pain 02/14/2018   Chronic right-sided low back pain with right-sided sciatica 02/14/2018   Diabetes mellitus type II    DM2 (diabetes mellitus, type 2) (HCC)    HTN (hypertension)    Hyperlipidemia    Renal insufficiency    Unspecified pleural effusion 08/07/2011    Past Surgical History:  Procedure Laterality Date   CARDIAC CATHETERIZATION     CARDIOVERSION N/A 08/01/2021   Procedure: CARDIOVERSION;  Surgeon: Lelon Perla, MD;  Location: [facility] ENDOSCOPY;  Service: Cardiovascular;  Laterality: N/A;   cardioversion of atrial flutter.  07/31/11   Right miniature thoracotomy for resection of left atrial myxoma.  06/09/11   Dr Roxy Manns    Current Medications: Outpatient Medications Prior to Visit  Medication Sig Dispense Refill   amLODipine (NORVASC) 5 MG tablet Take 5 mg by mouth daily.     apixaban (ELIQUIS) 5 MG TABS tablet Take 1 tablet (5 mg total) by mouth 2 (two) times daily. 60 tablet 6   benazepril (LOTENSIN) 40 MG tablet Take 1 tablet (40 mg total) by mouth daily. 90 tablet 1   Continuous Blood Gluc Sensor (FREESTYLE LIBRE 2 SENSOR) MISC      desoximetasone (TOPICORT) 0.05 % cream Apply topically 2 (two) times daily.       empagliflozin (JARDIANCE) 10 MG TABS tablet Take 1 tablet (10 mg total) by mouth daily. 30 tablet 6   esomeprazole (NEXIUM) 40 MG capsule Take 40 mg by mouth daily before breakfast.       fluticasone furoate-vilanterol (BREO ELLIPTA) 200-25 MCG/ACT AEPB Inhale 1 puff into the lungs as needed.     HUMALOG KWIKPEN 200 UNIT/ML SOPN Inject 25 Units as  directed 2 (two) times daily.     Insulin Glargine (BASAGLAR KWIKPEN) 100 UNIT/ML SOPN Inject 45 Units into the skin 2 (two) times daily.     latanoprost (XALATAN) 0.005 % ophthalmic solution Place 1 drop into both eyes at bedtime.     metolazone (ZAROXOLYN) 2.5 MG tablet Take 2.5 mg by mouth daily.     minoxidil (LONITEN) 10 MG tablet Take 2 tablets by mouth twice daily. 120 tablet 3   nebivolol (BYSTOLIC) 10 MG tablet Take 1 tablet (10 mg total) by mouth daily. 90 tablet 3   potassium chloride SA (KLOR-CON M20) 20 MEQ tablet Take 1 tablet (20 mEq total) by mouth daily. 90 tablet 3   rosuvastatin (CRESTOR) 10 MG tablet Take 10 mg by mouth daily.     sitaGLIPtin (JANUVIA) 100 MG tablet Take 100 mg by mouth daily.       tamsulosin (FLOMAX) 0.4 MG CAPS capsule Take 0.4 mg by mouth daily.     timolol (BETIMOL) 0.5 % ophthalmic solution Place 1 drop into both eyes 2 (two) times daily.     torsemide (DEMADEX) 20 MG tablet Take 2 tablets (40 mg total) by mouth 2 (two) times daily. 180 tablet 3   metolazone (ZAROXOLYN) 5 MG tablet Take 1 tablet (5 mg total)  by mouth daily for 5 days. (Patient not taking: Reported on 01/25/2022) 5 tablet 0   No facility-administered medications prior to visit.     Allergies:   Patient has no known allergies.   Social History   Socioeconomic History   Marital status: Married    Spouse name: Not on file   Number of children: Not on file   Years of education: Not on file   Highest education level: Not on file  Occupational History   Occupation: IT sales professional: SELF-EMPLOYED  Tobacco Use   Smoking status: Former    Types: Cigarettes    Quit date: 11/20/2006    Years since quitting: 15.4   Smokeless tobacco: Never  Vaping Use   Vaping Use: Never used  Substance and Sexual Activity   Alcohol use: No   Drug use: No   Sexual activity: Not on file  Other Topics Concern   Not on file  Social History Narrative   Not on file   Social Determinants of Health    Financial Resource Strain: Not on file  Food Insecurity: Not on file  Transportation Needs: Not on file  Physical Activity: Not on file  Stress: Not on file  Social Connections: Not on file     Family History:  The patient's ***family history includes CAD in his father.   ROS General: Negative; No fevers, chills, or night sweats;  HEENT: Negative; No changes in vision or hearing, sinus congestion, difficulty swallowing Pulmonary: Negative; No cough, wheezing, shortness of breath, hemoptysis Cardiovascular: Negative; No chest pain, presyncope, syncope, palpitations GI: Negative; No nausea, vomiting, diarrhea, or abdominal pain GU: Negative; No dysuria, hematuria, or difficulty voiding Musculoskeletal: Negative; no myalgias, joint pain, or weakness Hematologic/Oncology: Negative; no easy bruising, bleeding Endocrine: Negative; no heat/cold intolerance; no diabetes Neuro: Negative; no changes in balance, headaches Skin: Negative; No rashes or skin lesions Psychiatric: Negative; No behavioral problems, depression Sleep: Negative; No snoring, daytime sleepiness, hypersomnolence, bruxism, restless legs, hypnogognic hallucinations, no cataplexy Other comprehensive 14 point system review is negative.   PHYSICAL EXAM:   VS:  BP 120/70 (BP Location: Left Arm)   Pulse 65   Ht _0  (1.803 m)   Wt 262 lb 6.4 oz (119 kg)   SpO2 92%   BMI 36.60 kg/m    Wt Readings from Last 3 Encounters:  04/26/22 262 lb 6.4 oz (119 kg)  01/25/22 260 lb 3.2 oz (118 kg)  01/04/22 259 lb (117.5 kg)    General: Alert, oriented, no distress.  Skin: normal turgor, no rashes, warm and dry HEENT: Normocephalic, atraumatic. Pupils equal round and reactive to light; sclera anicteric; extraocular muscles intact; Fundi ** Nose without nasal septal hypertrophy Mouth/Parynx benign; Mallinpatti scale Neck: No JVD, no carotid bruits; normal carotid upstroke Lungs: clear to ausculatation and percussion; no  wheezing or rales Chest wall: without tenderness to palpitation Heart: PMI not displaced, RRR, s1 s2 normal, 1/6 systolic murmur, no diastolic murmur, no rubs, gallops, thrills, or heaves Abdomen: soft, nontender; no hepatosplenomehaly, BS+; abdominal aorta nontender and not dilated by palpation. Back: no CVA tenderness Pulses 2+ Musculoskeletal: full range of motion, normal strength, no joint deformities Extremities: no clubbing cyanosis or edema, Homan's sign negative  Neurologic: grossly nonfocal; Cranial nerves grossly wnl Psychologic: Normal mood and affect   Studies/Labs Reviewed:   ECG (independently read by me): Atrial flutter with variable block at 65; QTc 428 msc, Inferolateral ST changes  Recent Labs:    Latest Ref  Rng & Units 01/25/2022   11:45 AM 01/20/2022   10:51 AM 12/01/2021    3:20 PM  BMP  Glucose 70 - 99 mg/dL 97   159   121    BUN 8 - 27 mg/dL 25   41   27    Creatinine 0.76 - 1.27 mg/dL 1.62   2.89   1.79    BUN/Creat Ratio 10 - _0 Sodium 134 - 144 mmol/L 142   140   142    Potassium 3.5 - 5.2 mmol/L 3.9   3.9   3.8    Chloride 96 - 106 mmol/L 100   89   99    CO2 20 - 29 mmol/L 26   34   30    Calcium 8.6 - 10.2 mg/dL 10.4   9.8   9.9          Latest Ref Rng & Units 12/01/2021    3:20 PM 06/07/2011    2:00 PM  Hepatic Function  Total Protein 6.0 - 8.5 g/dL 7.6   7.8    Albumin 3.8 - 4.8 g/dL 4.8   4.5    AST 0 - 40 IU/L 29   30    ALT 0 - 44 IU/L 26   35    Alk Phosphatase 44 - 121 IU/L 65   50    Total Bilirubin 0.0 - 1.2 mg/dL 0.8   0.3         Latest Ref Rng & Units 07/29/2021    3:39 AM 07/28/2021    5:21 PM 07/31/2011   12:18 PM  CBC  WBC 4.0 - 10.5 K/uL 4.2   4.9   6.0    Hemoglobin 13.0 - 17.0 g/dL 14.9   15.5   12.9    Hematocrit 39.0 - 52.0 % 43.7   45.6   37.6    Platelets 150 - 400 K/uL 221   218   542     Lab Results  Component Value Date   MCV 78.3 (L) 07/29/2021   MCV 78.8 (L) 07/28/2021   MCV 73.0 (L) 07/31/2011    Lab Results  Component Value Date   TSH 1.171 07/29/2021   Lab Results  Component Value Date   HGBA1C 6.7 (H) 07/28/2021     BNP    Component Value Date/Time   BNP 111.2 (H) 07/28/2021 1721    ProBNP    Component Value Date/Time   PROBNP 248 (H) 12/01/2021 1520     Lipid Panel     Component Value Date/Time   CHOL 91 07/29/2021 0339   TRIG 86 07/29/2021 0339   HDL 26 (L) 07/29/2021 0339   CHOLHDL 3.5 07/29/2021 0339   VLDL 17 07/29/2021 0339   LDLCALC 48 07/29/2021 0339     RADIOLOGY: No results found.   Additional studies/ records that were reviewed today include:  ***    ASSESSMENT:    No diagnosis found.   PLAN:  ***   Medication Adjustments/Labs and Tests Ordered: Current medicines are reviewed at length with the patient today.  Concerns regarding medicines are outlined above.  Medication changes, Labs and Tests ordered today are listed in the Patient Instructions below. There are no Patient Instructions on file for this visit.   Signed, Shelva Majestic, MD, [facility], [place], American Board of Sleep Medicine  04/26/2022 2:38 PM    [facility] [address]  [address], [place], [place]  [address] Phone: [phone]

## 2022-04-26 NOTE — Patient Instructions (Addendum)
Medication Instructions:    Increase dose of Bystolic to 1.5 mg daily  ( 1 and 1/2 tablet)    Decrease Amlodipine 2.5 mg  one tablet daily     *If you need a refill on your cardiac medications before your next appointment, please call your pharmacy*   Lab Work:  Not needed    Testing/Procedures: Your physician has requested that you have an echocardiogram. Echocardiography is a painless test that uses sound waves to create images of your heart. It provides your doctor with information about the size and shape of your heart and how well your heart's chambers and valves are working. This procedure takes approximately one hour. There are no restrictions for this procedure.    Follow-Up: At [facility], you and your health needs are our priority.  As part of our continuing mission to provide you with exceptional heart care, we have created designated Provider Care Teams.  These Care Teams include your primary Cardiologist (physician) and Advanced Practice Providers (APPs -  Physician Assistants and Nurse Practitioners) who all work together to provide you with the care you need, when you need it.     Your next appointment:   2 week(s)  The format for your next appointment:   In Person  Provider:   You will follow up in the [facility] located at [facility]. Your provider will be: Roderic Palau, NP or Clint R. Fenton, PA-C   Your physician recommends that you schedule a follow-up appointment in: 12 months with Dr Claiborne Billings - sleep clinic  Other Instructions    pressure changes were done to your C-PAP

## 2022-04-27 LAB — BASIC METABOLIC PANEL
BUN/Creatinine Ratio: 11 (ref 10–24)
BUN: 20 mg/dL (ref 8–27)
CO2: 26 mmol/L (ref 20–29)
Calcium: 9.4 mg/dL (ref 8.6–10.2)
Chloride: 99 mmol/L (ref 96–106)
Creatinine, Ser: 1.74 mg/dL — ABNORMAL HIGH (ref 0.76–1.27)
Glucose: 134 mg/dL — ABNORMAL HIGH (ref 70–99)
Potassium: 4 mmol/L (ref 3.5–5.2)
Sodium: 141 mmol/L (ref 134–144)
eGFR: 44 mL/min/{1.73_m2} — ABNORMAL LOW (ref >59)

## 2022-05-01 ENCOUNTER — Ambulatory Visit (INDEPENDENT_AMBULATORY_CARE_PROVIDER_SITE_OTHER): Payer: 59

## 2022-05-01 DIAGNOSIS — I483 Typical atrial flutter: Secondary | ICD-10-CM

## 2022-05-01 DIAGNOSIS — R29818 Other symptoms and signs involving the nervous system: Secondary | ICD-10-CM

## 2022-05-01 DIAGNOSIS — G4733 Obstructive sleep apnea (adult) (pediatric): Secondary | ICD-10-CM | POA: Diagnosis not present

## 2022-05-01 LAB — ECHOCARDIOGRAM COMPLETE
AR max vel: 2.06 cm2
AV Area VTI: 2.01 cm2
AV Area mean vel: 2.1 cm2
AV Mean grad: 6 mmHg
AV Peak grad: 11 mmHg
Ao pk vel: 1.66 m/s
Area-P 1/2: 3.74 cm2
Calc EF: 49.7 %
S' Lateral: 3.19 cm
Single Plane A2C EF: 41.7 %
Single Plane A4C EF: 56.4 %

## 2022-05-09 ENCOUNTER — Telehealth: Payer: Self-pay | Admitting: Internal Medicine

## 2022-05-09 NOTE — Telephone Encounter (Signed)
Pt c/o medication issue:  1. Name of Medication: amLODipine (NORVASC) 2.5 MG tablet  2. How are you currently taking this medication (dosage and times per day)? Take 1 tablet (2.5 mg total) by mouth daily  3. Are you having a reaction (difficulty breathing--STAT)? no  4. What is your medication issue?   Wife calling to say that [facility] need a letter to say that the dosage is patient medication has been change to 2.'5mg'$  instead of '10mg'$ . Please advise

## 2022-05-09 NOTE — Telephone Encounter (Signed)
Called spoke with pt's wife. She is made aware I called [facility] and spoke with the staff. They state the medication change has been confirmed by the pt. No further information needed at this time.

## 2022-05-10 ENCOUNTER — Ambulatory Visit (HOSPITAL_COMMUNITY)
Admission: RE | Admit: 2022-05-10 | Discharge: 2022-05-10 | Disposition: A | Discharge status: Home / Self Care | Payer: 59 | Source: Ambulatory Visit | Attending: Nurse Practitioner | Admitting: Nurse Practitioner

## 2022-05-10 VITALS — BP 114/56 | HR 67 | Ht 71.0 in | Wt 262.4 lb

## 2022-05-10 DIAGNOSIS — Z794 Long term (current) use of insulin: Secondary | ICD-10-CM | POA: Insufficient documentation

## 2022-05-10 DIAGNOSIS — I110 Hypertensive heart disease with heart failure: Secondary | ICD-10-CM | POA: Diagnosis not present

## 2022-05-10 DIAGNOSIS — E785 Hyperlipidemia, unspecified: Secondary | ICD-10-CM | POA: Insufficient documentation

## 2022-05-10 DIAGNOSIS — E118 Type 2 diabetes mellitus with unspecified complications: Secondary | ICD-10-CM | POA: Insufficient documentation

## 2022-05-10 DIAGNOSIS — I483 Typical atrial flutter: Secondary | ICD-10-CM | POA: Diagnosis not present

## 2022-05-10 DIAGNOSIS — I4892 Unspecified atrial flutter: Secondary | ICD-10-CM | POA: Diagnosis not present

## 2022-05-10 DIAGNOSIS — D6869 Other thrombophilia: Secondary | ICD-10-CM | POA: Diagnosis not present

## 2022-05-10 DIAGNOSIS — Z7901 Long term (current) use of anticoagulants: Secondary | ICD-10-CM | POA: Insufficient documentation

## 2022-05-10 DIAGNOSIS — I5032 Chronic diastolic (congestive) heart failure: Secondary | ICD-10-CM | POA: Insufficient documentation

## 2022-05-10 NOTE — Progress Notes (Incomplete)
Primary Care Physician: Haywood Pao, MD Referring Physician: Dr. Tylene Fantasia Richard Barton is a 66 y.o. male with a h/o atrial flutter,CHF, atrial myxoma, DMT2, HTN, HLD, that has been referred to the afib clinic for atrial flutter. Pt states that he had a cardioversion last September but feels he may have been out of rhythm for a couple of months. He also reports having a cardioversion around the time that he had a left atrail myoma removed by Dr. Roxy Manns.he does have h/o of HF and chronic LLE. Wers support knee high socks.    Today, he denies symptoms of palpitations, chest pain, shortness of breath, orthopnea, PND, lower extremity edema, dizziness, presyncope, syncope, or neurologic sequela. The patient is tolerating medications without difficulties and is otherwise without complaint today.   Past Medical History:  Diagnosis Date   Atrial flutter ([place])    Atrial myxoma    Left   Benign neoplasm of heart 08/07/2011   Chronic right shoulder pain 02/14/2018   Chronic right-sided low back pain with right-sided sciatica 02/14/2018   Diabetes mellitus type II    DM2 (diabetes mellitus, type 2) (HCC)    HTN (hypertension)    Hyperlipidemia    Renal insufficiency    Unspecified pleural effusion 08/07/2011   Past Surgical History:  Procedure Laterality Date   CARDIAC CATHETERIZATION     CARDIOVERSION N/A 08/01/2021   Procedure: CARDIOVERSION;  Surgeon: Lelon Perla, MD;  Location: [facility] ENDOSCOPY;  Service: Cardiovascular;  Laterality: N/A;   cardioversion of atrial flutter.  07/31/11   Right miniature thoracotomy for resection of left atrial myxoma.  06/09/11   Dr Roxy Manns    Current Outpatient Medications  Medication Sig Dispense Refill   amLODipine (NORVASC) 2.5 MG tablet Take 1 tablet (2.5 mg total) by mouth daily. 90 tablet 3   apixaban (ELIQUIS) 5 MG TABS tablet Take 1 tablet (5 mg total) by mouth 2 (two) times daily. 60 tablet 6   benazepril (LOTENSIN) 40 MG tablet Take 1 tablet  (40 mg total) by mouth daily. 90 tablet 1   Continuous Blood Gluc Sensor (FREESTYLE LIBRE 2 SENSOR) MISC      desoximetasone (TOPICORT) 0.05 % cream Apply topically 2 (two) times daily.       empagliflozin (JARDIANCE) 10 MG TABS tablet Take 1 tablet (10 mg total) by mouth daily. 30 tablet 6   esomeprazole (NEXIUM) 40 MG capsule Take 40 mg by mouth daily before breakfast.       fluticasone furoate-vilanterol (BREO ELLIPTA) 200-25 MCG/ACT AEPB Inhale 1 puff into the lungs as needed.     HUMALOG KWIKPEN 200 UNIT/ML SOPN Inject 25 Units as directed 2 (two) times daily.     Insulin Glargine (BASAGLAR KWIKPEN) 100 UNIT/ML SOPN Inject 45 Units into the skin 2 (two) times daily.     latanoprost (XALATAN) 0.005 % ophthalmic solution Place 1 drop into both eyes at bedtime.     metolazone (ZAROXOLYN) 2.5 MG tablet Take 2.5 mg by mouth daily.     minoxidil (LONITEN) 10 MG tablet Take 2 tablets by mouth twice daily. 120 tablet 3   nebivolol (BYSTOLIC) 10 MG tablet Take 1.5 tablets (15 mg total) by mouth daily. 135 tablet 3   potassium chloride SA (KLOR-CON M20) 20 MEQ tablet Take 1 tablet (20 mEq total) by mouth daily. 90 tablet 3   rosuvastatin (CRESTOR) 10 MG tablet Take 10 mg by mouth daily.     sitaGLIPtin (JANUVIA) 100 MG tablet  Take 100 mg by mouth daily.       tamsulosin (FLOMAX) 0.4 MG CAPS capsule Take 0.4 mg by mouth daily.     timolol (BETIMOL) 0.5 % ophthalmic solution Place 1 drop into both eyes 2 (two) times daily.     torsemide (DEMADEX) 20 MG tablet Take 2 tablets (40 mg total) by mouth 2 (two) times daily. 180 tablet 3   No current facility-administered medications for this encounter.    No Known Allergies  Social History   Socioeconomic History   Marital status: Married    Spouse name: Not on file   Number of children: Not on file   Years of education: Not on file   Highest education level: Not on file  Occupational History   Occupation: IT sales professional: SELF-EMPLOYED   Tobacco Use   Smoking status: Former    Types: Cigarettes    Quit date: 11/20/2006    Years since quitting: 15.4   Smokeless tobacco: Never  Vaping Use   Vaping Use: Never used  Substance and Sexual Activity   Alcohol use: No   Drug use: No   Sexual activity: Not on file  Other Topics Concern   Not on file  Social History Narrative   Not on file   Social Determinants of Health   Financial Resource Strain: Not on file  Food Insecurity: Not on file  Transportation Needs: Not on file  Physical Activity: Not on file  Stress: Not on file  Social Connections: Not on file  Intimate Partner Violence: Not on file    Family History  Problem Relation Age of Onset   CAD Father     ROS- All systems are reviewed and negative except as per the HPI above  Physical Exam: There were no vitals filed for this visit. Wt Readings from Last 3 Encounters:  04/26/22 119 kg  01/25/22 118 kg  01/04/22 117.5 kg    Labs: Lab Results  Component Value Date   NA 141 04/26/2022   K 4.0 04/26/2022   CL 99 04/26/2022   CO2 26 04/26/2022   GLUCOSE 134 (H) 04/26/2022   BUN 20 04/26/2022   CREATININE 1.74 (H) 04/26/2022   CALCIUM 9.4 04/26/2022   MG 2.2 01/20/2022   Lab Results  Component Value Date   INR 1.3 (H) 08/01/2021   Lab Results  Component Value Date   CHOL 91 07/29/2021   HDL 26 (L) 07/29/2021   LDLCALC 48 07/29/2021   TRIG 86 07/29/2021     GEN- The patient is well appearing, alert and oriented x 3 today.   Head- normocephalic, atraumatic Eyes-  Sclera clear, conjunctiva pink Ears- hearing intact Oropharynx- clear Neck- supple, no JVP Lymph- no cervical lymphadenopathy Lungs- Clear to ausculation bilaterally, normal work of breathing Heart- Regular rate and rhythm, no murmurs, rubs or gallops, PMI not laterally displaced GI- soft, NT, ND, + BS Extremities- no clubbing, cyanosis, or edema MS- no significant deformity or atrophy Skin- no rash or lesion Psych-  euthymic mood, full affect Neuro- strength and sensation are intact  EKG-    Assessment and Plan:

## 2022-05-11 ENCOUNTER — Encounter (HOSPITAL_COMMUNITY): Payer: Self-pay | Admitting: Nurse Practitioner

## 2022-05-11 ENCOUNTER — Other Ambulatory Visit (HOSPITAL_COMMUNITY): Payer: Self-pay | Admitting: *Deleted

## 2022-05-11 DIAGNOSIS — I483 Typical atrial flutter: Secondary | ICD-10-CM

## 2022-05-13 ENCOUNTER — Encounter: Payer: Self-pay | Admitting: Cardiovascular Disease

## 2022-06-01 ENCOUNTER — Encounter: Payer: Self-pay | Admitting: Internal Medicine

## 2022-06-01 ENCOUNTER — Ambulatory Visit: Payer: 59 | Admitting: Internal Medicine

## 2022-06-01 VITALS — BP 114/66 | HR 77 | Ht 71.0 in | Wt 257.8 lb

## 2022-06-01 DIAGNOSIS — I483 Typical atrial flutter: Secondary | ICD-10-CM | POA: Diagnosis not present

## 2022-06-01 DIAGNOSIS — I10 Essential (primary) hypertension: Secondary | ICD-10-CM | POA: Diagnosis not present

## 2022-06-01 NOTE — Patient Instructions (Addendum)
Medication Instructions:  Your physician recommends that you continue on your current medications as directed. Please refer to the Current Medication list given to you today.  *If you need a refill on your cardiac medications before your next appointment, please call your pharmacy*  Lab Work: You are scheduled to have labs drawn on 06/26/2022;  CBC, BMP SEE INSTRUCTION LETTER:  Follow-Up: At [facility], you and your health needs are our priority.  As part of our continuing mission to provide you with exceptional heart care, we have created designated Provider Care Teams.  These Care Teams include your primary Cardiologist (physician) and Advanced Practice Providers (APPs -  Physician Assistants and Nurse Practitioners) who all work together to provide you with the care you need, when you need it.  We recommend signing up for the patient portal called "MyChart".  Sign up information is provided on this After Visit Summary.  MyChart is used to connect with patients for Virtual Visits (Telemedicine).  Patients are able to view lab/test results, encounter notes, upcoming appointments, etc.  Non-urgent messages can be sent to your provider as well.   To learn more about what you can do with MyChart, go to [email or web address].       Important Information About Sugar      Cardiac Ablation Cardiac ablation is a procedure to destroy, or ablate, a small amount of heart tissue in very specific places. The heart has many electrical connections. Sometimes these connections are abnormal and can cause the heart to beat very fast or irregularly. Ablating some of the areas that cause problems can improve the heart's rhythm or return it to normal. Ablation may be done for people who: Have Wolff-Parkinson-White syndrome. Have fast heart rhythms (tachycardia). Have taken medicines for an abnormal heart rhythm (arrhythmia) that were not effective or caused side effects. Have a high-risk heartbeat that  may be life-threatening. During the procedure, a small incision is made in the neck or the groin, and a long, thin tube (catheter) is inserted into the incision and moved to the heart. Small devices (electrodes) on the tip of the catheter will send out electrical currents. A type of X-ray (fluoroscopy) will be used to help guide the catheter and to provide images of the heart. Tell a health care provider about: Any allergies you have. All medicines you are taking, including vitamins, herbs, eye drops, creams, and over-the-counter medicines. Any problems you or family members have had with anesthetic medicines. Any blood disorders you have. Any surgeries you have had. Any medical conditions you have, such as kidney failure. Whether you are pregnant or may be pregnant. What are the risks? Generally, this is a safe procedure. However, problems may occur, including: Infection. Bruising and bleeding at the catheter insertion site. Bleeding into the chest, especially into the sac that surrounds the heart. This is a serious complication. Stroke or blood clots. Damage to nearby structures or organs. Allergic reaction to medicines or dyes. Need for a permanent pacemaker if the normal electrical system is damaged. A pacemaker is a small computer that sends electrical signals to the heart and helps your heart beat normally. The procedure not being fully effective. This may not be recognized until months later. Repeat ablation procedures are sometimes done. What happens before the procedure? Medicines Ask your health care provider about: Changing or stopping your regular medicines. This is especially important if you are taking diabetes medicines or blood thinners. Taking medicines such as aspirin and ibuprofen. These medicines can thin your  blood. Do not take these medicines unless your health care provider tells you to take them. Taking over-the-counter medicines, vitamins, herbs, and  supplements. General instructions Follow instructions from your health care provider about eating or drinking restrictions. Plan to have someone take you home from the hospital or clinic. If you will be going home right after the procedure, plan to have someone with you for 24 hours. Ask your health care provider what steps will be taken to prevent infection. What happens during the procedure?  An IV will be inserted into one of your veins. You will be given a medicine to help you relax (sedative). The skin on your neck or groin will be numbed. An incision will be made in your neck or your groin. A needle will be inserted through the incision and into a large vein in your neck or groin. A catheter will be inserted into the needle and moved to your heart. Dye may be injected through the catheter to help your surgeon see the area of the heart that needs treatment. Electrical currents will be sent from the catheter to ablate heart tissue in desired areas. There are three types of energy that may be used to do this: Heat (radiofrequency energy). Laser energy. Extreme cold (cryoablation). When the tissue has been ablated, the catheter will be removed. Pressure will be held on the insertion area to prevent a lot of bleeding. A bandage (dressing) will be placed over the insertion area. The exact procedure may vary among health care providers and hospitals. What happens after the procedure? Your blood pressure, heart rate, breathing rate, and blood oxygen level will be monitored until you leave the hospital or clinic. Your insertion area will be monitored for bleeding. You will need to lie still for a few hours to ensure that you do not bleed from the insertion area. Do not drive for 24 hours or as long as told by your health care provider. Summary Cardiac ablation is a procedure to destroy, or ablate, a small amount of heart tissue using an electrical current. This procedure can improve the  heart rhythm or return it to normal. Tell your health care provider about any medical conditions you may have and all medicines you are taking to treat them. This is a safe procedure, but problems may occur. Problems may include infection, bruising, damage to nearby organs or structures, or allergic reactions to medicines. Follow your health care provider's instructions about eating and drinking before the procedure. You may also be told to change or stop some of your medicines. After the procedure, do not drive for 24 hours or as long as told by your health care provider. This information is not intended to replace advice given to you by your health care provider. Make sure you discuss any questions you have with your health care provider. Document Revised: 09/15/2019 Document Reviewed: 09/15/2019 Elsevier Patient Education  [place].

## 2022-06-01 NOTE — Progress Notes (Signed)
HPI Richard Barton is referred today by [name] for evaluation of atrial flutter. He has a h/o left atrial myxoma s/p resection, who has developed atrial flutter 11 years later. He has had DCCV but had return of the atrial flutter. He has class 2 dyspnea and has preserved LV function. He has no chest pain. He has been bothered by peripheral edema.   No Known Allergies   Current Outpatient Medications  Medication Sig Dispense Refill   amLODipine (NORVASC) 2.5 MG tablet Take 1 tablet (2.5 mg total) by mouth daily. 90 tablet 3   apixaban (ELIQUIS) 5 MG TABS tablet Take 1 tablet (5 mg total) by mouth 2 (two) times daily. 60 tablet 6   benazepril (LOTENSIN) 40 MG tablet Take 1 tablet (40 mg total) by mouth daily. 90 tablet 1   Continuous Blood Gluc Sensor (FREESTYLE LIBRE 2 SENSOR) MISC      desoximetasone (TOPICORT) 0.05 % cream Apply topically 2 (two) times daily.     empagliflozin (JARDIANCE) 10 MG TABS tablet Take 1 tablet (10 mg total) by mouth daily. 30 tablet 6   esomeprazole (NEXIUM) 40 MG capsule Take 40 mg by mouth daily before breakfast.       fluticasone furoate-vilanterol (BREO ELLIPTA) 200-25 MCG/ACT AEPB Inhale 1 puff into the lungs as needed.     HUMALOG KWIKPEN 200 UNIT/ML SOPN Inject 25 Units as directed 2 (two) times daily.     Insulin Glargine (BASAGLAR KWIKPEN) 100 UNIT/ML SOPN Inject 45 Units into the skin 2 (two) times daily.     latanoprost (XALATAN) 0.005 % ophthalmic solution Place 1 drop into both eyes at bedtime.     metolazone (ZAROXOLYN) 2.5 MG tablet Take 2.5 mg by mouth once a week.     minoxidil (LONITEN) 10 MG tablet Take 2 tablets by mouth twice daily. 120 tablet 3   nebivolol (BYSTOLIC) 10 MG tablet Take 1.5 tablets (15 mg total) by mouth daily. 135 tablet 3   potassium chloride SA (KLOR-CON M20) 20 MEQ tablet Take 1 tablet (20 mEq total) by mouth daily. 90 tablet 3   rosuvastatin (CRESTOR) 10 MG tablet Take 10 mg by mouth daily.     sitaGLIPtin  (JANUVIA) 100 MG tablet Take 100 mg by mouth daily.       tamsulosin (FLOMAX) 0.4 MG CAPS capsule Take 0.4 mg by mouth daily.     timolol (BETIMOL) 0.5 % ophthalmic solution Place 1 drop into both eyes 2 (two) times daily.     torsemide (DEMADEX) 20 MG tablet Take 2 tablets (40 mg total) by mouth 2 (two) times daily. 180 tablet 3   No current facility-administered medications for this visit.     Past Medical History:  Diagnosis Date   Atrial flutter ([place])    Atrial myxoma    Left   Benign neoplasm of heart 08/07/2011   Chronic right shoulder pain 02/14/2018   Chronic right-sided low back pain with right-sided sciatica 02/14/2018   Diabetes mellitus type II    DM2 (diabetes mellitus, type 2) (HCC)    HTN (hypertension)    Hyperlipidemia    Renal insufficiency    Unspecified pleural effusion 08/07/2011    ROS:   All systems reviewed and negative except as noted in the HPI.   Past Surgical History:  Procedure Laterality Date   CARDIAC CATHETERIZATION     CARDIOVERSION N/A 08/01/2021   Procedure: CARDIOVERSION;  Surgeon: Lelon Perla, MD;  Location: [facility] ENDOSCOPY;  Service: Cardiovascular;  Laterality: N/A;   cardioversion of atrial flutter.  07/31/11   Right miniature thoracotomy for resection of left atrial myxoma.  06/09/11   Dr Roxy Manns     Family History  Problem Relation Age of Onset   CAD Father      Social History   Socioeconomic History   Marital status: Married    Spouse name: Not on file   Number of children: Not on file   Years of education: Not on file   Highest education level: Not on file  Occupational History   Occupation: IT sales professional: SELF-EMPLOYED  Tobacco Use   Smoking status: Former    Types: Cigarettes    Quit date: 11/20/2006    Years since quitting: 15.5   Smokeless tobacco: Never  Vaping Use   Vaping Use: Never used  Substance and Sexual Activity   Alcohol use: No   Drug use: No   Sexual activity: Not on file  Other Topics  Concern   Not on file  Social History Narrative   Not on file   Social Determinants of Health   Financial Resource Strain: Not on file  Food Insecurity: Not on file  Transportation Needs: Not on file  Physical Activity: Not on file  Stress: Not on file  Social Connections: Not on file  Intimate Partner Violence: Not on file     BP 114/66   Pulse 77   Ht '5\' 11"'$  (1.803 m)   Wt 257 lb 12.8 oz (116.9 kg)   BMI 35.96 kg/m   Physical Exam:  Well appearing obese 66 yo man, NAD HEENT: Unremarkable Neck:  No JVD, no thyromegally Lymphatics:  No adenopathy Back:  No CVA tenderness Lungs:  Clear with no wheezes HEART:  Regular rate rhythm, no murmurs, no rubs, no clicks Abd:  soft, positive bowel sounds, no organomegally, no rebound, no guarding Ext:  2 plus pulses, no edema, no cyanosis, no clubbing Skin:  No rashes no nodules Neuro:  CN II through XII intact, motor grossly intact  EKG - atrial flutter with a controlled VR   Assess/Plan:  Atrial flutter - I have discussed the treatment options with the patient and the risks/benefits/goals/expectations of catheter ablation were reviewed and he wishes to proceed. LA myxoma - he is s/p resection and doing well.  Diastolic heart failure - his symptoms are class 3. We will follow.  Carleene Overlie Richard Massing,MD

## 2022-06-13 ENCOUNTER — Ambulatory Visit: Payer: 59 | Admitting: Internal Medicine

## 2022-06-24 ENCOUNTER — Other Ambulatory Visit (HOSPITAL_BASED_OUTPATIENT_CLINIC_OR_DEPARTMENT_OTHER): Payer: Self-pay | Admitting: Family

## 2022-06-26 ENCOUNTER — Other Ambulatory Visit: Payer: 59

## 2022-06-26 DIAGNOSIS — I10 Essential (primary) hypertension: Secondary | ICD-10-CM

## 2022-06-26 DIAGNOSIS — I483 Typical atrial flutter: Secondary | ICD-10-CM

## 2022-06-26 LAB — CBC WITH DIFFERENTIAL/PLATELET
Basophils Absolute: 0 10*3/uL (ref 0.0–0.2)
Basos: 1 %
EOS (ABSOLUTE): 0.1 10*3/uL (ref 0.0–0.4)
Eos: 2 %
Hematocrit: 44.1 % (ref 37.5–51.0)
Hemoglobin: 15.1 g/dL (ref 13.0–17.7)
Lymphocytes Absolute: 1.3 10*3/uL (ref 0.7–3.1)
Lymphs: 27 %
MCH: 27.3 pg (ref 26.6–33.0)
MCHC: 34.2 g/dL (ref 31.5–35.7)
MCV: 80 fL (ref 79–97)
Monocytes Absolute: 0.6 10*3/uL (ref 0.1–0.9)
Monocytes: 13 %
Neutrophils Absolute: 2.8 10*3/uL (ref 1.4–7.0)
Neutrophils: 57 %
Platelets: 188 10*3/uL (ref 150–450)
RBC: 5.53 x10E6/uL (ref 4.14–5.80)
RDW: 17.6 % — ABNORMAL HIGH (ref 11.6–15.4)
WBC: 4.9 10*3/uL (ref 3.4–10.8)

## 2022-06-26 LAB — BASIC METABOLIC PANEL
BUN/Creatinine Ratio: 20 (ref 10–24)
BUN: 54 mg/dL — ABNORMAL HIGH (ref 8–27)
CO2: 31 mmol/L — ABNORMAL HIGH (ref 20–29)
Calcium: 10.3 mg/dL — ABNORMAL HIGH (ref 8.6–10.2)
Chloride: 93 mmol/L — ABNORMAL LOW (ref 96–106)
Creatinine, Ser: 2.64 mg/dL — ABNORMAL HIGH (ref 0.76–1.27)
Glucose: 231 mg/dL — ABNORMAL HIGH (ref 70–99)
Potassium: 3.8 mmol/L (ref 3.5–5.2)
Sodium: 137 mmol/L (ref 134–144)
eGFR: 26 mL/min/{1.73_m2} — ABNORMAL LOW (ref >59)

## 2022-06-26 NOTE — Telephone Encounter (Signed)
Rx(s) sent to pharmacy electronically.  

## 2022-06-27 ENCOUNTER — Other Ambulatory Visit (HOSPITAL_BASED_OUTPATIENT_CLINIC_OR_DEPARTMENT_OTHER): Payer: Self-pay

## 2022-06-27 NOTE — Telephone Encounter (Signed)
Received fax from [facility] by [place] requesting refills for Minoxidil.   Pt of Dr. Margaretann Loveless. Routing to [facility] office for review.

## 2022-07-13 NOTE — Pre-Procedure Instructions (Signed)
Attempted to call patient regarding procedure instructions for tomorrow.  Left voice mail on the following items: Arrival time 0530 Nothing to eat or drink after midnight No meds AM of procedure Responsible person to drive you home and stay with you for 24 hrs  Have you missed any doses of anti-coagulant Eliquis- take both doses today, don't take dose in am.  If you have missed any doses please let us know if you have missed any doses

## 2022-07-14 ENCOUNTER — Ambulatory Visit (HOSPITAL_BASED_OUTPATIENT_CLINIC_OR_DEPARTMENT_OTHER): Payer: 59 | Admitting: Anesthesiology

## 2022-07-14 ENCOUNTER — Ambulatory Visit (HOSPITAL_COMMUNITY): Payer: 59 | Admitting: Anesthesiology

## 2022-07-14 ENCOUNTER — Ambulatory Visit (HOSPITAL_COMMUNITY)
Admission: RE | Admit: 2022-07-14 | Discharge: 2022-07-14 | Disposition: A | Discharge status: Home / Self Care | Payer: 59 | Attending: Internal Medicine | Admitting: Internal Medicine

## 2022-07-14 ENCOUNTER — Other Ambulatory Visit: Payer: Self-pay

## 2022-07-14 ENCOUNTER — Encounter (HOSPITAL_COMMUNITY)
Admission: RE | Disposition: A | Discharge status: Home / Self Care | Payer: 59 | Source: Home / Self Care | Attending: Internal Medicine

## 2022-07-14 DIAGNOSIS — E119 Type 2 diabetes mellitus without complications: Secondary | ICD-10-CM | POA: Insufficient documentation

## 2022-07-14 DIAGNOSIS — I4892 Unspecified atrial flutter: Secondary | ICD-10-CM

## 2022-07-14 DIAGNOSIS — Z794 Long term (current) use of insulin: Secondary | ICD-10-CM | POA: Insufficient documentation

## 2022-07-14 DIAGNOSIS — I483 Typical atrial flutter: Secondary | ICD-10-CM | POA: Diagnosis not present

## 2022-07-14 DIAGNOSIS — Z87891 Personal history of nicotine dependence: Secondary | ICD-10-CM | POA: Diagnosis not present

## 2022-07-14 DIAGNOSIS — I509 Heart failure, unspecified: Secondary | ICD-10-CM | POA: Diagnosis not present

## 2022-07-14 DIAGNOSIS — I5030 Unspecified diastolic (congestive) heart failure: Secondary | ICD-10-CM | POA: Insufficient documentation

## 2022-07-14 DIAGNOSIS — I110 Hypertensive heart disease with heart failure: Secondary | ICD-10-CM

## 2022-07-14 DIAGNOSIS — Z7984 Long term (current) use of oral hypoglycemic drugs: Secondary | ICD-10-CM | POA: Insufficient documentation

## 2022-07-14 HISTORY — PX: A-FLUTTER ABLATION: EP1230

## 2022-07-14 LAB — GLUCOSE, CAPILLARY
Glucose-Capillary: 159 mg/dL — ABNORMAL HIGH (ref 70–99)
Glucose-Capillary: 139 mg/dL — ABNORMAL HIGH (ref 70–99)

## 2022-07-14 SURGERY — A-FLUTTER ABLATION
Anesthesia: General

## 2022-07-14 MED ORDER — SODIUM CHLORIDE 0.9% FLUSH: 3.0000 mL | INTRAVENOUS | Status: DC | PRN

## 2022-07-14 MED ORDER — SODIUM CHLORIDE 0.9% FLUSH: 3.0000 mL | Freq: Two times a day (BID) | INTRAVENOUS | Status: DC

## 2022-07-14 MED ORDER — ROCURONIUM BROMIDE 10 MG/ML (PF) SYRINGE
PREFILLED_SYRINGE | INTRAVENOUS | Status: DC | PRN
  Administered 2022-07-14: 60 mg via INTRAVENOUS

## 2022-07-14 MED ORDER — SODIUM CHLORIDE 0.9 % IV SOLN: INTRAVENOUS | Status: DC

## 2022-07-14 MED ORDER — PHENYLEPHRINE 80 MCG/ML (10ML) SYRINGE FOR IV PUSH (FOR BLOOD PRESSURE SUPPORT)
PREFILLED_SYRINGE | INTRAVENOUS | Status: DC | PRN
  Administered 2022-07-14 (×2): 80 ug via INTRAVENOUS

## 2022-07-14 MED ORDER — LIDOCAINE 2% (20 MG/ML) 5 ML SYRINGE
INTRAMUSCULAR | Status: DC | PRN
  Administered 2022-07-14: 100 mg via INTRAVENOUS

## 2022-07-14 MED ORDER — ONDANSETRON HCL 4 MG/2ML IJ SOLN
INTRAMUSCULAR | Status: DC | PRN
  Administered 2022-07-14: 4 mg via INTRAVENOUS

## 2022-07-14 MED ORDER — FENTANYL CITRATE (PF) 250 MCG/5ML IJ SOLN
INTRAMUSCULAR | Status: DC | PRN
  Administered 2022-07-14 (×2): 50 ug via INTRAVENOUS

## 2022-07-14 MED ORDER — HEPARIN SODIUM (PORCINE) 1000 UNIT/ML IJ SOLN
INTRAMUSCULAR | Status: AC
  Filled 2022-07-14: qty 10

## 2022-07-14 MED ORDER — SODIUM CHLORIDE 0.9 % IV SOLN: 250.0000 mL | INTRAVENOUS | Status: DC | PRN

## 2022-07-14 MED ORDER — ACETAMINOPHEN 325 MG PO TABS: 650.0000 mg | ORAL_TABLET | ORAL | Status: DC | PRN

## 2022-07-14 MED ORDER — PHENYLEPHRINE HCL-NACL 20-0.9 MG/250ML-% IV SOLN
INTRAVENOUS | Status: DC | PRN
  Administered 2022-07-14: 40 ug/min via INTRAVENOUS

## 2022-07-14 MED ORDER — EPHEDRINE SULFATE-NACL 50-0.9 MG/10ML-% IV SOSY
PREFILLED_SYRINGE | INTRAVENOUS | Status: DC | PRN
  Administered 2022-07-14: 5 mg via INTRAVENOUS

## 2022-07-14 MED ORDER — PROPOFOL 10 MG/ML IV BOLUS
INTRAVENOUS | Status: DC | PRN
  Administered 2022-07-14: 150 mg via INTRAVENOUS

## 2022-07-14 MED ORDER — MIDAZOLAM HCL 2 MG/2ML IJ SOLN
INTRAMUSCULAR | Status: DC | PRN
  Administered 2022-07-14: 1 mg via INTRAVENOUS

## 2022-07-14 MED ORDER — DEXAMETHASONE SODIUM PHOSPHATE 10 MG/ML IJ SOLN
INTRAMUSCULAR | Status: DC | PRN
  Administered 2022-07-14: 5 mg via INTRAVENOUS

## 2022-07-14 MED ORDER — SUGAMMADEX SODIUM 200 MG/2ML IV SOLN
INTRAVENOUS | Status: DC | PRN
  Administered 2022-07-14: 230 mg via INTRAVENOUS

## 2022-07-14 MED ORDER — HEPARIN SODIUM (PORCINE) 1000 UNIT/ML IJ SOLN
INTRAMUSCULAR | Status: DC | PRN
  Administered 2022-07-14: 1000 [IU] via INTRAVENOUS

## 2022-07-14 MED ORDER — ONDANSETRON HCL 4 MG/2ML IJ SOLN: 4.0000 mg | Freq: Four times a day (QID) | INTRAMUSCULAR | Status: DC | PRN

## 2022-07-14 MED ORDER — SUCCINYLCHOLINE CHLORIDE 200 MG/10ML IV SOSY
PREFILLED_SYRINGE | INTRAVENOUS | Status: DC | PRN
  Administered 2022-07-14: 120 mg via INTRAVENOUS

## 2022-07-14 MED ORDER — HEPARIN (PORCINE) IN NACL 1000-0.9 UT/500ML-% IV SOLN
INTRAVENOUS | Status: DC | PRN
  Administered 2022-07-14: 500 mL

## 2022-07-14 SURGICAL SUPPLY — 11 items
CATH SMTCH THERMOCOOL SF FJ (CATHETERS) IMPLANT
CATH WEB BI DIR CSDF CRV REPRO (CATHETERS) IMPLANT
MAT PREVALON FULL STRYKER (MISCELLANEOUS) IMPLANT
PACK EP LATEX FREE (CUSTOM PROCEDURE TRAY) ×1
PACK EP LF (CUSTOM PROCEDURE TRAY) ×1 IMPLANT
PAD DEFIB RADIO PHYSIO CONN (PAD) ×1 IMPLANT
PATCH CARTO3 (PAD) IMPLANT
SHEATH PINNACLE 6F 10CM (SHEATH) IMPLANT
SHEATH PINNACLE 7F 10CM (SHEATH) IMPLANT
SHEATH PINNACLE 8F 10CM (SHEATH) IMPLANT
TUBING SMART ABLATE COOLFLOW (TUBING) IMPLANT

## 2022-07-14 NOTE — Anesthesia Preprocedure Evaluation (Signed)
Anesthesia Evaluation  Patient identified by MRN, date of birth, ID band Patient awake    Reviewed: Allergy & Precautions, NPO status , Patient's Chart, lab work & pertinent test results  Airway Mallampati: II       Dental   Pulmonary former smoker,    breath sounds clear to auscultation       Cardiovascular hypertension, +CHF   Rhythm:Regular Rate:Normal     Neuro/Psych  Neuromuscular disease    GI/Hepatic negative GI ROS, Neg liver ROS,   Endo/Other  diabetes  Renal/GU Renal disease     Musculoskeletal   Abdominal   Peds  Hematology   Anesthesia Other Findings   Reproductive/Obstetrics                             Anesthesia Physical Anesthesia Plan  ASA: 3  Anesthesia Plan:    Post-op Pain Management:    Induction: Intravenous  PONV Risk Score and Plan: 3 and Ondansetron and Dexamethasone  Airway Management Planned: Oral ETT  Additional Equipment:   Intra-op Plan:   Post-operative Plan: Extubation in OR  Informed Consent: I have reviewed the patients History and Physical, chart, labs and discussed the procedure including the risks, benefits and alternatives for the proposed anesthesia with the patient or authorized representative who has indicated his/her understanding and acceptance.     Dental advisory given  Plan Discussed with: Anesthesiologist and CRNA  Anesthesia Plan Comments:         Anesthesia Quick Evaluation

## 2022-07-14 NOTE — Anesthesia Postprocedure Evaluation (Signed)
Anesthesia Post Note  Patient: Richard Barton  Procedure(s) Performed: A-FLUTTER ABLATION     Patient location during evaluation: PACU Anesthesia Type: MAC Level of consciousness: awake Pain management: pain level controlled Vital Signs Assessment: post-procedure vital signs reviewed and stable Respiratory status: spontaneous breathing Cardiovascular status: stable Postop Assessment: no apparent nausea or vomiting Anesthetic complications: no   There were no known notable events for this encounter.  Last Vitals:  Vitals:   07/14/22 0525  BP: 137/82  Pulse: 65  Resp: 16  Temp: 36.8 C  SpO2: 92%    Last Pain:  Vitals:   07/14/22 0612  TempSrc:   PainSc: 0-No pain                 Mitchelle Goerner

## 2022-07-14 NOTE — Progress Notes (Signed)
Site area: right groin  Site Prior to Removal:  Level 0  Pressure Applied For 15 MINUTES    Minutes Beginning at 0945  Manual:   Yes.    Patient Status During Pull:  Stable  Post Pull Groin Site:  Level 0  Post Pull Instructions Given:  Yes.    Post Pull Pulses Present:  Yes.    Dressing Applied:  Yes.    Comments:  Bed rest started at 1000 X 6 hr.

## 2022-07-14 NOTE — Transfer of Care (Signed)
Immediate Anesthesia Transfer of Care Note  Patient: Richard Barton  Procedure(s) Performed: A-FLUTTER ABLATION  Patient Location: Cath Lab  Anesthesia Type:General  Level of Consciousness: awake, alert  and oriented  Airway & Oxygen Therapy: Patient Spontanous Breathing and Patient connected to nasal cannula oxygen  Post-op Assessment: Report given to RN, Post -op Vital signs reviewed and stable and Patient moving all extremities  Post vital signs: Reviewed and stable  Last Vitals:  Vitals Value Taken Time  BP    Temp    Pulse    Resp    SpO2      Last Pain:  Vitals:   07/14/22 0612  TempSrc:   PainSc: 0-No pain         Complications: There were no known notable events for this encounter.

## 2022-07-14 NOTE — Addendum Note (Signed)
Addendum  created 07/14/22 1257 by Amadeo Garnet, CRNA   Intraprocedure Meds edited

## 2022-07-14 NOTE — Anesthesia Procedure Notes (Signed)
Procedure Name: Intubation Date/Time: 07/14/2022 7:55 AM  Performed by: Amadeo Garnet, CRNAPre-anesthesia Checklist: Patient identified, Emergency Drugs available, Suction available and Patient being monitored Patient Re-evaluated:Patient Re-evaluated prior to induction Oxygen Delivery Method: Circle system utilized Preoxygenation: Pre-oxygenation with 100% oxygen Induction Type: IV induction Ventilation: Mask ventilation without difficulty Laryngoscope Size: Mac and 4 Grade View: Grade II Tube type: Oral Tube size: 7.5 mm Number of attempts: 1 Airway Equipment and Method: Stylet and Oral airway Placement Confirmation: ETT inserted through vocal cords under direct vision, positive ETCO2 and breath sounds checked- equal and bilateral Secured at: 23 cm Tube secured with: Tape Dental Injury: Teeth and Oropharynx as per pre-operative assessment

## 2022-07-14 NOTE — H&P (Signed)
HPI [name] is referred today by Richard Barton for evaluation of atrial flutter. He has a h/o left atrial myxoma s/p resection, who has developed atrial flutter 11 years later. He has had DCCV but had return of the atrial flutter. He has class 2 dyspnea and has preserved LV function. He has no chest pain. He has been bothered by peripheral edema.   No Known Allergies           Current Outpatient Medications  Medication Sig Dispense Refill   amLODipine (NORVASC) 2.5 MG tablet Take 1 tablet (2.5 mg total) by mouth daily. 90 tablet 3   apixaban (ELIQUIS) 5 MG TABS tablet Take 1 tablet (5 mg total) by mouth 2 (two) times daily. 60 tablet 6   benazepril (LOTENSIN) 40 MG tablet Take 1 tablet (40 mg total) by mouth daily. 90 tablet 1   Continuous Blood Gluc Sensor (FREESTYLE LIBRE 2 SENSOR) MISC         desoximetasone (TOPICORT) 0.05 % cream Apply topically 2 (two) times daily.       empagliflozin (JARDIANCE) 10 MG TABS tablet Take 1 tablet (10 mg total) by mouth daily. 30 tablet 6   esomeprazole (NEXIUM) 40 MG capsule Take 40 mg by mouth daily before breakfast.         fluticasone furoate-vilanterol (BREO ELLIPTA) 200-25 MCG/ACT AEPB Inhale 1 puff into the lungs as needed.       HUMALOG KWIKPEN 200 UNIT/ML SOPN Inject 25 Units as directed 2 (two) times daily.       Insulin Glargine (BASAGLAR KWIKPEN) 100 UNIT/ML SOPN Inject 45 Units into the skin 2 (two) times daily.       latanoprost (XALATAN) 0.005 % ophthalmic solution Place 1 drop into both eyes at bedtime.       metolazone (ZAROXOLYN) 2.5 MG tablet Take 2.5 mg by mouth once a week.       minoxidil (LONITEN) 10 MG tablet Take 2 tablets by mouth twice daily. 120 tablet 3   nebivolol (BYSTOLIC) 10 MG tablet Take 1.5 tablets (15 mg total) by mouth daily. 135 tablet 3   potassium chloride SA (KLOR-CON M20) 20 MEQ tablet Take 1 tablet (20 mEq total) by mouth daily. 90 tablet 3   rosuvastatin (CRESTOR) 10 MG tablet Take 10 mg by mouth  daily.       sitaGLIPtin (JANUVIA) 100 MG tablet Take 100 mg by mouth daily.         tamsulosin (FLOMAX) 0.4 MG CAPS capsule Take 0.4 mg by mouth daily.       timolol (BETIMOL) 0.5 % ophthalmic solution Place 1 drop into both eyes 2 (two) times daily.       torsemide (DEMADEX) 20 MG tablet Take 2 tablets (40 mg total) by mouth 2 (two) times daily. 180 tablet 3    No current facility-administered medications for this visit.            Past Medical History:  Diagnosis Date   Atrial flutter ([place])     Atrial myxoma      Left   Benign neoplasm of heart 08/07/2011   Chronic right shoulder pain 02/14/2018   Chronic right-sided low back pain with right-sided sciatica 02/14/2018   Diabetes mellitus type II     DM2 (diabetes mellitus, type 2) (HCC)     HTN (hypertension)     Hyperlipidemia     Renal insufficiency     Unspecified pleural effusion  08/07/2011      ROS:    All systems reviewed and negative except as noted in the HPI.          Past Surgical History:  Procedure Laterality Date   CARDIAC CATHETERIZATION       CARDIOVERSION N/A 08/01/2021    Procedure: CARDIOVERSION;  Surgeon: Richard Perla, MD;  Location: [facility] ENDOSCOPY;  Service: Cardiovascular;  Laterality: N/A;   cardioversion of atrial flutter.   07/31/11   Right miniature thoracotomy for resection of left atrial myxoma.   06/09/11    Dr Richard Barton             Family History  Problem Relation Age of Onset   CAD Father          Social History         Socioeconomic History   Marital status: Married      Spouse name: Richard Barton   Number of children: Richard Barton   Years of education: Richard Barton   Highest education level: Richard Barton  Occupational History   Occupation: Richard Barton: Richard Barton  Tobacco Use   Smoking status: Former      Types: Cigarettes      Quit date: 11/20/2006      Years since quitting: 15.5   Smokeless tobacco: Never  Vaping Use   Vaping Use: Never used  Substance and Sexual  Activity   Alcohol use: No   Drug use: No   Sexual activity: Richard Barton  Other Topics Concern   Richard Barton  Social History Narrative   Richard Barton    Social Determinants of Health    Financial Resource Strain: Richard Barton  Food Insecurity: Richard Barton  Transportation Needs: Richard Barton  Physical Activity: Richard Barton  Stress: Richard Barton  Social Connections: Richard Barton  Intimate Partner Violence: Richard Barton        BP 114/66   Pulse 77   Ht '5\' 11"'$  (1.803 m)   Wt 257 lb 12.8 oz (116.9 kg)   BMI 35.96 kg/m    Physical Exam:   Well appearing obese 66 yo man, NAD HEENT: Unremarkable Neck:  No JVD, no thyromegally Lymphatics:  No adenopathy Back:  No CVA tenderness Lungs:  Clear with no wheezes HEART:  Regular rate rhythm, no murmurs, no rubs, no clicks Abd:  soft, positive bowel sounds, no organomegally, no rebound, no guarding Ext:  2 plus pulses, no edema, no cyanosis, no clubbing Skin:  No rashes no nodules Neuro:  CN II through XII intact, motor grossly intact   EKG - atrial flutter with a controlled VR     Assess/Plan:  Atrial flutter - I have discussed the treatment options with the patient and the risks/benefits/goals/expectations of catheter ablation were reviewed and he wishes to proceed. LA myxoma - he is s/p resection and doing well.  Diastolic heart failure - his symptoms are class 3. We will follow.   Richard Barton  EP Attending  Since prior clinic visit, no change. I have reviewed the indications/risks/benefits/goals/expectations and he wishes to proceed.  Richard Barton Richard Ishii,MD

## 2022-07-17 ENCOUNTER — Encounter (HOSPITAL_COMMUNITY): Payer: Self-pay | Admitting: Internal Medicine

## 2022-08-25 ENCOUNTER — Ambulatory Visit: Payer: 59 | Attending: Internal Medicine | Admitting: Internal Medicine

## 2022-08-25 ENCOUNTER — Encounter: Payer: Self-pay | Admitting: Internal Medicine

## 2022-08-25 VITALS — BP 148/70 | HR 64 | Ht 71.5 in | Wt 264.0 lb

## 2022-08-25 DIAGNOSIS — I483 Typical atrial flutter: Secondary | ICD-10-CM | POA: Diagnosis not present

## 2022-08-25 MED ORDER — ASPIRIN 81 MG PO TBEC: 81.0000 mg | DELAYED_RELEASE_TABLET | Freq: Every day | ORAL | 5 refills | Status: DC

## 2022-08-25 NOTE — Patient Instructions (Addendum)
Medication Instructions:  Your physician has recommended you make the following change in your medication: STOP TAKING ELIQUIS:   ( TODAY )   You will STOP TAKING your Eliquis today per Dr. Tanna Furry order.   START Taking-  Aspirin 81 mg.   You will take one ( 81 mg ) tablet by mouth daily.    Lab Work: None ordered.  If you have labs (blood work) drawn today and your tests are completely normal, you will receive your results only by: [facility] (if you have MyChart) OR A paper copy in the mail If you have any lab test that is abnormal or we need to change your treatment, we will call you to review the results.  Testing/Procedures: None ordered.  Follow-Up: At [facility], you and your health needs are our priority.  As part of our continuing mission to provide you with exceptional heart care, we have created designated Provider Care Teams.  These Care Teams include your primary Cardiologist (physician) and Advanced Practice Providers (APPs -  Physician Assistants and Nurse Practitioners) who all work together to provide you with the care you need, when you need it.  We recommend signing up for the patient portal called "MyChart".  Sign up information is provided on this After Visit Summary.  MyChart is used to connect with patients for Virtual Visits (Telemedicine).  Patients are able to view lab/test results, encounter notes, upcoming appointments, etc.  Non-urgent messages can be sent to your provider as well.   To learn more about what you can do with MyChart, go to [email or web address].    Your next appointment:   AS NEEDED   The format for your next appointment:   In Person  Provider:   Cristopher Peru, MD{or one of the following Advanced Practice Providers on your designated Care Team:   Tommye Standard, [place] Legrand Como "Jonni Sanger" Chalmers Cater, [place]   Important Information About Sugar

## 2022-08-25 NOTE — Progress Notes (Signed)
HPI Richard Barton returns today after catheter ablation of atrial flutter. He has a h/o left atrial myxoma s/p resection, who has developed atrial flutter 11 years later. He has had DCCV but had return of the atrial flutter. He has class 2 dyspnea and has preserved LV function. He has no chest pain. He has been bothered by peripheral edema.  He has done well since his ablation with no recurrent symptomatic arrhythmias.  No Known Allergies   Current Outpatient Medications  Medication Sig Dispense Refill   amLODipine (NORVASC) 2.5 MG tablet Take 1 tablet (2.5 mg total) by mouth daily. 90 tablet 3   apixaban (ELIQUIS) 5 MG TABS tablet Take 1 tablet (5 mg total) by mouth 2 (two) times daily. 60 tablet 6   benazepril (LOTENSIN) 40 MG tablet Take 1 tablet (40 mg total) by mouth daily. 90 tablet 1   Camphor-Eucalyptus-Menthol (VICKS VAPORUB EX) Apply 1 Application topically daily as needed (Legs and feet).     Continuous Blood Gluc Sensor (FREESTYLE LIBRE 2 SENSOR) MISC      desoximetasone (TOPICORT) 0.05 % cream Apply 1 Application topically daily as needed (Dry skin).     empagliflozin (JARDIANCE) 10 MG TABS tablet Take 1 tablet (10 mg total) by mouth daily. 30 tablet 6   esomeprazole (NEXIUM) 40 MG capsule Take 40 mg by mouth daily before breakfast.       fluticasone (FLONASE) 50 MCG/ACT nasal spray Place 1 spray into both nostrils daily.     fluticasone furoate-vilanterol (BREO ELLIPTA) 100-25 MCG/ACT AEPB Inhale 1 puff into the lungs daily.     HUMALOG KWIKPEN 200 UNIT/ML SOPN Inject 40 Units as directed 2 (two) times daily.     Insulin Glargine (BASAGLAR KWIKPEN) 100 UNIT/ML SOPN Inject 60 Units into the skin 2 (two) times daily.     MAGNESIUM PO Take 1 tablet by mouth daily.     metolazone (ZAROXOLYN) 2.5 MG tablet Take 2.5 mg by mouth daily as needed (Fluid).     minoxidil (LONITEN) 10 MG tablet Take 2 tablets by mouth twice daily. 180 tablet 2   nebivolol (BYSTOLIC) 10 MG tablet Take  1.5 tablets (15 mg total) by mouth daily. 135 tablet 3   Oxymetazoline HCl (VICKS SINEX 12 HOUR NA) Place 1 spray into the nose daily as needed (Congestion).     potassium chloride SA (KLOR-CON M20) 20 MEQ tablet Take 1 tablet (20 mEq total) by mouth daily. 90 tablet 3   rosuvastatin (CRESTOR) 10 MG tablet Take 10 mg by mouth daily.     sitaGLIPtin (JANUVIA) 100 MG tablet Take 100 mg by mouth daily.       tamsulosin (FLOMAX) 0.4 MG CAPS capsule Take 0.4 mg by mouth at bedtime.     torsemide (DEMADEX) 20 MG tablet Take 2 tablets (40 mg total) by mouth 2 (two) times daily. 180 tablet 3   No current facility-administered medications for this visit.     Past Medical History:  Diagnosis Date   Atrial flutter ([place])    Atrial myxoma    Left   Benign neoplasm of heart 08/07/2011   Chronic right shoulder pain 02/14/2018   Chronic right-sided low back pain with right-sided sciatica 02/14/2018   Diabetes mellitus type II    DM2 (diabetes mellitus, type 2) (HCC)    HTN (hypertension)    Hyperlipidemia    Renal insufficiency    Unspecified pleural effusion 08/07/2011    ROS:   All systems reviewed  and negative except as noted in the HPI.   Past Surgical History:  Procedure Laterality Date   A-FLUTTER ABLATION N/A 07/14/2022   Procedure: A-FLUTTER ABLATION;  Surgeon: Evans Lance, MD;  Location: [facility];  Service: Cardiovascular;  Laterality: N/A;   CARDIAC CATHETERIZATION     CARDIOVERSION N/A 08/01/2021   Procedure: CARDIOVERSION;  Surgeon: Lelon Perla, MD;  Location: [facility] ENDOSCOPY;  Service: Cardiovascular;  Laterality: N/A;   cardioversion of atrial flutter.  07/31/11   Right miniature thoracotomy for resection of left atrial myxoma.  06/09/11   Dr Roxy Manns     Family History  Problem Relation Age of Onset   CAD Father      Social History   Socioeconomic History   Marital status: Married    Spouse name: Not on file   Number of children: Not on file   Years of  education: Not on file   Highest education level: Not on file  Occupational History   Occupation: IT sales professional: SELF-EMPLOYED  Tobacco Use   Smoking status: Former    Types: Cigarettes    Quit date: 11/20/2006    Years since quitting: 15.7   Smokeless tobacco: Never  Vaping Use   Vaping Use: Never used  Substance and Sexual Activity   Alcohol use: No   Drug use: No   Sexual activity: Not on file  Other Topics Concern   Not on file  Social History Narrative   Not on file   Social Determinants of Health   Financial Resource Strain: Not on file  Food Insecurity: Not on file  Transportation Needs: Not on file  Physical Activity: Not on file  Stress: Not on file  Social Connections: Not on file  Intimate Partner Violence: Not on file     BP (!) 148/70   Pulse 64   Ht 5' 11.5" (1.816 m)   Wt 264 lb (119.7 kg)   BMI 36.31 kg/m   Physical Exam:  Well appearing middle aged man, NAD HEENT: Unremarkable Neck:  No JVD, no thyromegally Lymphatics:  No adenopathy Back:  No CVA tenderness Lungs:  Clear with no wheezes HEART:  Regular rate rhythm, no murmurs, no rubs, no clicks Abd:  soft, positive bowel sounds, no organomegally, no rebound, no guarding Ext:  2 plus pulses, no edema, no cyanosis, no clubbing Skin:  No rashes no nodules Neuro:  CN II through XII intact, motor grossly intact  EKG - nsr  Assess/Plan:  Atrial flutter - he is doing well after EP study and catheter ablation and is maintaining NSR.  LA myxoma - he is s/p resection. No evidence of recurrence Diastolic heart failure - he is well compensated. No change in his meds. Coags - he is maintaining NSR and will stop the eliquis.  Richard Overlie Philopateer Strine,MD

## 2022-10-24 ENCOUNTER — Telehealth (HOSPITAL_BASED_OUTPATIENT_CLINIC_OR_DEPARTMENT_OTHER): Payer: Self-pay

## 2022-10-24 MED ORDER — MINOXIDIL 10 MG PO TABS: 20.0000 mg | ORAL_TABLET | Freq: Two times a day (BID) | ORAL | 2 refills | Status: DC

## 2022-10-24 NOTE — Telephone Encounter (Signed)
Refill request sent to pharm on file

## 2022-12-28 ENCOUNTER — Encounter (HOSPITAL_COMMUNITY): Payer: Self-pay | Admitting: *Deleted

## 2023-01-25 LAB — LAB REPORT - SCANNED: EGFR: 49

## 2023-03-16 ENCOUNTER — Ambulatory Visit: Payer: 59 | Attending: Internal Medicine | Admitting: Internal Medicine

## 2023-03-16 ENCOUNTER — Encounter: Payer: Self-pay | Admitting: Internal Medicine

## 2023-03-16 VITALS — BP 138/86 | HR 83 | Ht 71.5 in | Wt 249.6 lb

## 2023-03-16 DIAGNOSIS — I10 Essential (primary) hypertension: Secondary | ICD-10-CM

## 2023-03-16 DIAGNOSIS — I483 Typical atrial flutter: Secondary | ICD-10-CM

## 2023-03-16 MED ORDER — NEBIVOLOL HCL 10 MG PO TABS: 10.0000 mg | ORAL_TABLET | Freq: Every day | ORAL | 3 refills | Status: DC

## 2023-03-16 MED ORDER — TORSEMIDE 20 MG PO TABS: 40.0000 mg | ORAL_TABLET | Freq: Two times a day (BID) | ORAL | 3 refills | Status: DC

## 2023-03-16 NOTE — Progress Notes (Signed)
Cardiology Office Note:    Date:  03/16/2023  ID:  Richard Barton, DOB 1956/05/04, MRN [id number]  PCP:  Gaspar Garbe, MD  Cardiologist:  Parke Poisson, MD  Electrophysiologist:  None   Referring MD: Gaspar Garbe, MD   Chief Complaint: follow up resected LA myxoma, HTN  History of Present Illness:    Richard Barton is a 67 y.o. male with a history of LA myxoma with resection 2012, hypertension, HLD with severe LVH and CKD, seen 03/19/2019 by my partner Dr. Dulce Sellar. Echo in 2018 showed severe LVH and no recurrence of myxoma. He has a history of atrial flutter with cardioversion in 2012 with Dr. Donnie Aho.  At his 02/2020 visit he was doing well aside from some surgical site discomfort from left atrial myxoma resection but no chest pain or syncope. He previously had dyspnea on exertion but took lasix and this improved. His most bothersome symptom was behind the left knee, mostly with trying to flex the knee. No claudication, no rest pain. Upon thorough chart review, unclear why not on anticoagulation though likely due to lack of recurrence.    He most recently followed up with Dr. Ladona Ridgel 08/25/2022. He had a prior cardioversion 08/01/2021. He had underwent catheter ablation of atrial flutter on 07/14/2022. He was maintaining sinus rhythm. Eliquis was discontinued and he was started on 81 mg ASA.  Today, he is feeling a lot better and breathing well. He attributes this to his last ablation; he remains in sinus rhythm at this time. A few months ago he began to feel like he was returning to baseline. Since the ablation, he has only needed to use metolazone once.   Very rarely, he would start retaining fluid so he pays attention to his sodium intake. He has been on Ozempic for about 2 months; Jardiance was discontinued. Both his blood sugar and appetite have decreased. Yesterday he decreased his basaglar by 10 units.  He endorses some med changes per his nephrologist. His BP was low and  Bystolic had been reduced from 1.5 tablets to 1 tablet. He monitors his home readings nearly every day.  Occasionally he takes his potassium supplement.  He has also been using his CPAP which he is tolerating.  He thinks this makes him feel better as well.  He denies any palpitations, chest pain, shortness of breath, or peripheral edema. No lightheadedness, headaches, syncope, orthopnea, or PND.   Past Medical History:  Diagnosis Date   Atrial flutter (HCC)    Atrial myxoma    Left   Benign neoplasm of heart 08/07/2011   Chronic right shoulder pain 02/14/2018   Chronic right-sided low back pain with right-sided sciatica 02/14/2018   Diabetes mellitus type II    DM2 (diabetes mellitus, type 2) (HCC)    HTN (hypertension)    Hyperlipidemia    Renal insufficiency    Unspecified pleural effusion 08/07/2011    Past Surgical History:  Procedure Laterality Date   A-FLUTTER ABLATION N/A 07/14/2022   Procedure: A-FLUTTER ABLATION;  Surgeon: Marinus Maw, MD;  Location: [facility]AS[facility];  Service: Cardiovascular;  Laterality: N/A;   CARDIAC CATHETERIZATION     CARDIOVERSION N/A 08/01/2021   Procedure: CARDIOVERSION;  Surgeon: Lewayne Bunting, MD;  Location: [facility] ENDOSCOPY;  Service: Cardiovascular;  Laterality: N/A;   cardioversion of atrial flutter.  07/31/11   Right miniature thoracotomy for resection of left atrial myxoma.  06/09/11   Dr Cornelius Moras    Current Medications: Current  Meds  Medication Sig   amLODipine (NORVASC) 2.5 MG tablet Take 1 tablet (2.5 mg total) by mouth daily.   aspirin EC 81 MG tablet Take 1 tablet (81 mg total) by mouth daily. Swallow whole.   benazepril (LOTENSIN) 40 MG tablet Take 1 tablet (40 mg total) by mouth daily.   Camphor-Eucalyptus-Menthol (VICKS VAPORUB EX) Apply 1 Application topically daily as needed (Legs and feet).   Continuous Blood Gluc Sensor (FREESTYLE LIBRE 2 SENSOR) MISC    desoximetasone (TOPICORT) 0.05 % cream Apply 1 Application topically  daily as needed (Dry skin).   esomeprazole (NEXIUM) 40 MG capsule Take 40 mg by mouth daily before breakfast.     fluticasone (FLONASE) 50 MCG/ACT nasal spray Place 1 spray into both nostrils daily.   fluticasone furoate-vilanterol (BREO ELLIPTA) 100-25 MCG/ACT AEPB Inhale 1 puff into the lungs daily.   HUMALOG KWIKPEN 200 UNIT/ML SOPN Inject 40 Units as directed 2 (two) times daily.   Insulin Glargine (BASAGLAR KWIKPEN) 100 UNIT/ML SOPN Inject 60 Units into the skin 2 (two) times daily.   MAGNESIUM PO Take 1 tablet by mouth daily.   metolazone (ZAROXOLYN) 2.5 MG tablet Take 2.5 mg by mouth daily as needed (Fluid).   minoxidil (LONITEN) 10 MG tablet Take 2 tablets (20 mg total) by mouth 2 (two) times daily.   Oxymetazoline HCl (VICKS SINEX 12 HOUR NA) Place 1 spray into the nose daily as needed (Congestion).   OZEMPIC, 1 MG/DOSE, 4 MG/3ML SOPN Inject 1 mg into the skin once a week.   potassium chloride SA (KLOR-CON M20) 20 MEQ tablet Take 1 tablet (20 mEq total) by mouth daily.   rosuvastatin (CRESTOR) 10 MG tablet Take 10 mg by mouth daily.   sitaGLIPtin (JANUVIA) 100 MG tablet Take 100 mg by mouth daily.     tamsulosin (FLOMAX) 0.4 MG CAPS capsule Take 0.4 mg by mouth at bedtime.   [DISCONTINUED] empagliflozin (JARDIANCE) 10 MG TABS tablet Take 1 tablet (10 mg total) by mouth daily.   [DISCONTINUED] nebivolol (BYSTOLIC) 10 MG tablet Take 1.5 tablets (15 mg total) by mouth daily.   [DISCONTINUED] torsemide (DEMADEX) 20 MG tablet Take 2 tablets (40 mg total) by mouth 2 (two) times daily.     Allergies:   Patient has no known allergies.   Social History   Socioeconomic History   Marital status: Married    Spouse name: Not on file   Number of children: Not on file   Years of education: Not on file   Highest education level: Not on file  Occupational History   Occupation: Office manager: SELF-EMPLOYED  Tobacco Use   Smoking status: Former    Types: Cigarettes    Quit date:  11/20/2006    Years since quitting: 16.3   Smokeless tobacco: Never  Vaping Use   Vaping Use: Never used  Substance and Sexual Activity   Alcohol use: No   Drug use: No   Sexual activity: Not on file  Other Topics Concern   Not on file  Social History Narrative   Not on file   Social Determinants of Health   Financial Resource Strain: Not on file  Food Insecurity: Not on file  Transportation Needs: Not on file  Physical Activity: Not on file  Stress: Not on file  Social Connections: Not on file     Family History: The patient's family history includes CAD in his father.  ROS:   Please see the history of present illness.  All other systems reviewed and are negative.  EKGs/Labs/Other Studies Reviewed:    The following studies were reviewed today:  A-Flutter Ablation  07/14/2022: CONCLUSIONS:  1. Isthmus-dependent right atrial flutter upon presentation.  2. Successful radiofrequency ablation of atrial flutter along the cavotricuspid isthmus with complete bidirectional isthmus block achieved utilizing 3D electroanatomic mapping and ablation.  3. No inducible arrhythmias following ablation.  4. No early apparent complications.   Echo  05/01/2022:  1. Left ventricular ejection fraction, by estimation, is 50 to 55%. The  left ventricle has low normal function. The left ventricle has no regional  wall motion abnormalities. There is mild concentric left ventricular  hypertrophy. Left ventricular  diastolic parameters are indeterminate. The average left ventricular  global longitudinal strain is -11.2 %. The global longitudinal strain is  abnormal.   2. Right ventricular systolic function is normal. The right ventricular  size is normal. There is mildly elevated pulmonary artery systolic  pressure.   3. The mitral valve is normal in structure. No evidence of mitral valve  regurgitation. No evidence of mitral stenosis.   4. The aortic valve is tricuspid. There is mild  calcification of the  aortic valve. There is mild thickening of the aortic valve. Aortic valve  regurgitation is not visualized. No aortic stenosis is present.   5. The inferior vena cava is normal in size with greater than 50%  respiratory variability, suggesting right atrial pressure of 3 mmHg.    EKG:  EKG is personally reviewed. 03/16/2023:  Sinus rhythm. First degree AV block. LVH.  03/18/2020: Sinus rhythm with PACs, LVH, IVCD ST-T wave abnormalities inferolateral, may represent repolarization change.  Recent Labs: 06/26/2022: BUN 54; Creatinine, Ser 2.64; Hemoglobin 15.1; Platelets 188; Potassium 3.8; Sodium 137   Recent Lipid Panel    Component Value Date/Time   CHOL 91 07/29/2021 0339   TRIG 86 07/29/2021 0339   HDL 26 (L) 07/29/2021 0339   CHOLHDL 3.5 07/29/2021 0339   VLDL 17 07/29/2021 0339   LDLCALC 48 07/29/2021 0339    Physical Exam:    VS:  BP 138/86   Pulse 83   Ht 5' 11.5" (1.816 m)   Wt 249 lb 9.6 oz (113.2 kg)   SpO2 95%   BMI 34.33 kg/m     Wt Readings from Last 5 Encounters:  03/16/23 249 lb 9.6 oz (113.2 kg)  08/25/22 264 lb (119.7 kg)  07/14/22 255 lb (115.7 kg)  06/01/22 257 lb 12.8 oz (116.9 kg)  05/10/22 262 lb 6.4 oz (119 kg)    Constitutional: No acute distress Eyes: sclera non-icteric, normal conjunctiva and lids ENMT: normal dentition, moist mucous membranes Cardiovascular: regular rhythm, normal rate, no murmurs. S1 and S2 normal. Radial pulses normal bilaterally. No jugular venous distention.  Respiratory: clear to auscultation bilaterally GI : normal bowel sounds, soft and nontender. No distention.   MSK: extremities warm, well perfused. No edema.  NEURO: grossly nonfocal exam, moves all extremities. PSYCH: alert and oriented x 3, normal mood and affect.   ASSESSMENT:    1. Essential hypertension   2. Typical atrial flutter (HCC)     PLAN:    Left Atrial myxoma - Plan: EKG 12-Lead -No recurrence of myxoma on most recent  echocardiogram.  Essential hypertension - Plan: EKG 12-Lead - continue amlodipine, benazepril, nebivolol, minoxidil, torsemide at current doses  DOE -  Continue torsemide 60 mg a.m. 40 mg p.m. per nephrology, symptoms well-controlled, class I-II NYHA dyspnea.  HLD - continue crestor  Afib requiring cardioversion -recently underwent atrial flutter ablation, he notes that after ablation he feels so much better.  Particularly in the last 2 months, symptoms have nearly resolved of shortness of breath and "he feels like a new man".  Per discussions with electrophysiology, continued on aspirin 81 mg daily without oral anticoagulation. -Echo prior to 1 year follow-up.  Follow-up:  1 year.  Total time of encounter: 30 minutes total time of encounter, including 20 minutes spent in face-to-face patient care on the date of this encounter. This time includes coordination of care and counseling regarding above mentioned problem list. Remainder of non-face-to-face time involved reviewing chart documents/testing relevant to the patient encounter and documentation in the medical record. I have independently reviewed documentation from referring provider.   Weston Brass, MD, [facility] [place]  [facility]   Medication Adjustments/Labs and Tests Ordered: Current medicines are reviewed at length with the patient today.  Concerns regarding medicines are outlined above.   Orders Placed This Encounter  Procedures   EKG 12-Lead   ECHOCARDIOGRAM COMPLETE   Meds ordered this encounter  Medications   nebivolol (BYSTOLIC) 10 MG tablet    Sig: Take 1 tablet (10 mg total) by mouth daily.    Dispense:  90 tablet    Refill:  3    Direction changed   torsemide (DEMADEX) 20 MG tablet    Sig: Take 2 tablets (40 mg total) by mouth 2 (two) times daily. TAKE 3 TABLETS IN THE MORNING AND 2 TABLET AT NIGHT    Dispense:  180 tablet    Refill:  3   Patient Instructions  Medication Instructions:  No Changes In  Medications at this time.  *If you need a refill on your cardiac medications before your next appointment, please call your pharmacy*  Testing/Procedures: Your physician has requested that you have an echocardiogram IN ONE YEAR. Echocardiography is a painless test that uses sound waves to create images of your heart. It provides your doctor with information about the size and shape of your heart and how well your heart's chambers and valves are working. You may receive an ultrasound enhancing agent through an IV if needed to better visualize your heart during the echo.This procedure takes approximately one hour. There are no restrictions for this procedure. This will take place at the [address], [address].   Follow-Up: At [facility], you and your health needs are our priority.  As part of our continuing mission to provide you with exceptional heart care, we have created designated Provider Care Teams.  These Care Teams include your primary Cardiologist (physician) and Advanced Practice Providers (APPs -  Physician Assistants and Nurse Practitioners) who all work together to provide you with the care you need, when you need it.  Your next appointment:   1 year(s)  Provider:   Parke Poisson, MD       [facility],acting as a scribe for Parke Poisson, MD.,have documented all relevant documentation on the behalf of Parke Poisson, MD,as directed by  Parke Poisson, MD while in the presence of Parke Poisson, MD.  I, Parke Poisson, MD, have reviewed all documentation for the visit on 03/16/2023. The documentation on today's date of service for the exam, diagnosis, procedures, and orders are all accurate and complete.

## 2023-03-16 NOTE — Patient Instructions (Signed)
Medication Instructions:  No Changes In Medications at this time.  *If you need a refill on your cardiac medications before your next appointment, please call your pharmacy*  Testing/Procedures: Your physician has requested that you have an echocardiogram IN ONE YEAR. Echocardiography is a painless test that uses sound waves to create images of your heart. It provides your doctor with information about the size and shape of your heart and how well your heart's chambers and valves are working. You may receive an ultrasound enhancing agent through an IV if needed to better visualize your heart during the echo.This procedure takes approximately one hour. There are no restrictions for this procedure. This will take place at the [address], [address].   Follow-Up: At [facility], you and your health needs are our priority.  As part of our continuing mission to provide you with exceptional heart care, we have created designated Provider Care Teams.  These Care Teams include your primary Cardiologist (physician) and Advanced Practice Providers (APPs -  Physician Assistants and Nurse Practitioners) who all work together to provide you with the care you need, when you need it.  Your next appointment:   1 year(s)  Provider:   Parke Poisson, MD

## 2023-05-02 ENCOUNTER — Other Ambulatory Visit: Payer: Self-pay

## 2023-05-02 MED ORDER — TORSEMIDE 20 MG PO TABS: 40.0000 mg | ORAL_TABLET | Freq: Two times a day (BID) | ORAL | 5 refills | Status: DC

## 2023-05-02 NOTE — Telephone Encounter (Signed)
Continue torsemide 60 mg a.m. 40 mg p.m. per nephrology, symptoms well-controlled, class I-II NYHA dyspnea.   Per Dr. Lupe Carney last note- refill sent in.

## 2023-05-02 NOTE — Telephone Encounter (Signed)
Can you please resend this to the pharmacy, there was not enough medication prescribed. Please address

## 2023-05-02 NOTE — Telephone Encounter (Signed)
[facility] mail order pharmacy is requesting clarification on medication Torsemide 20 mg tablets, how is pt supposed to be taking this medication and to send in enough tablets for a 3 month supply.  Ph# [phone]   Order# [id number]   please address

## 2023-07-21 ENCOUNTER — Encounter: Payer: Self-pay | Admitting: Cardiovascular Disease

## 2023-07-24 ENCOUNTER — Telehealth: Payer: Self-pay | Admitting: Internal Medicine

## 2023-07-24 NOTE — Telephone Encounter (Signed)
Patient states states SOB at times with minimal exertion. Pulse range 45-55.  Some times when SOB will feel  "Flustered" feels hot, but no chest pain States surgery approx 10 years ago and when he has the SOB the scar (right side ) feels tight but this has been ongoing for years. States when he eats, he feels like indigestion but not and as "if I'm going to have the hiccups", but does not.  This has been for approx a month. Appt set for Monday.  ED precautions given as well. He states understanding

## 2023-07-24 NOTE — Telephone Encounter (Signed)
  Per MyChart Scheduling message:  Initial Complaint:  I feel as though my heart is back out of rhythm again   Patient c/o Palpitations:  STAT if patient reporting lightheadedness, shortness of breath, or chest pain  How long have you had palpitations/irregular HR/ Afib? Are you having the symptoms now?   Are you currently experiencing lightheadedness, SOB or CP?   Do you have a history of afib (atrial fibrillation) or irregular heart rhythm?   Have you checked your BP or HR? (document readings if available):   Are you experiencing any other symptoms?      I don't really know I just realized one day that I felt short of breath about a month ago and my smart watch notified me several times. I do check it regularly but I'm never sure about it.  I don't have chest pain but shortness of breath comes and goes. I do have a history of a flutter  not afib and I had a procedure for a flutter last year.  I do check my BP fairly regularly but not everyday and I don't record it.  The monitor has a memory function.  Occasionally I will retain fluid for several days but I seem to get rid of it and feel better and a Occasional headache.  Other than that I feel pretty good.

## 2023-07-25 NOTE — Progress Notes (Unsigned)
Cardiology Office Note:    Date:  07/30/2023   ID:  Richard Barton, DOB 1956-01-30, MRN [id number]  PCP:  Gaspar Garbe, MD   [facility] Providers Cardiologist:  Parke Poisson, MD Sleep Medicine:  Nicki Guadalajara, MD      Referring MD: Gaspar Garbe, MD   Follow-up for lower extremity edema and acute on chronic diastolic CHF.  History of Present Illness:    Richard Barton is a 67 y.o. male with a hx of left atrial myxoma, hypertensive heart and kidney disease, chronic diastolic CHF, atrial flutter, CKD stage III, and hyperlipidemia.  He sent a  MyChart message which was reviewed on 01/16/2022.  He reported a 25 pound weight increase over 4 weeks.  He reported he was taking his 40 mg torsemide twice daily as prescribed.  He was contacted by nursing staff who recorded a blood pressure of 153/71 and a pulse of 67.  He was prescribed Zaroxolyn 5 mg daily for 5 days.  His BMP showed a creatinine increased to 2.89.  Baseline creatinine around 1.58-1.79.  He was contacted by Dr. Shari Prows on 01/21/2022 to review lab results.  He reported that his weight had decreased.  His renal function was reviewed.  Decreased renal function was expected in the setting of metolazone.  It was recommended that he hold his torsemide until Tuesday.  His benzapril was also placed on hold.  P.o. magnesium was recommended for muscle cramps.  He presented to the clinic 01/25/22 for follow-up evaluation and stated he was feeling much better .  He did note some aches in his shoulders and hips during Zaroxolyn treatment.  He also reported that his blood pressure came down to 90s over 60s/70s.  His blood pressure had been better since holding his benazepril.  We reviewed the importance of avoiding salt, fluid restriction, restarted his torsemide, restarted his benazepril, and planned follow-up for 3 to 4 months.  He was last seen in follow-up by Dr. Jacques Navy on 03/16/2023.  He had also been seen in follow-up by Dr.  Ladona Ridgel 08/25/2022.  He underwent cardioversion 08/01/2021.  He underwent ablation procedure 07/14/2022.  He continued to maintain sinus rhythm.  His Eliquis was discontinued and he was started on ASA.  During his follow-up with Dr. Jacques Navy he reported that he was breathing well and feeling much better.  He felt that his ablation was helping with this.  He felt like he had returned to his baseline a few months prior.  He noted that he had only used metolazone 1 time post ablation.  He was monitoring his sodium intake and fluid retention.  He had been taking Ozempic for about 2 months.  His Jardiance was discontinued.  He noted that his blood pressure and appetite have both decreased.  He continued to use his CPAP and was tolerating it well.  He denied chest pain and palpitations.  He presents to the clinic today for follow-up evaluation and states he had noticed increased weight and abdominal area distention/swelling.  He took 1/4 tablet of metolazone and lost around 10 pounds.  His weight today is 245.6 pounds.  His blood pressure is 136/66.  I reviewed his most recent echocardiogram.  He expressed understanding.  He reports episodes of palpitations that he associates with chest pressure.  I will order a BMP have him continue to weigh himself daily, and order 14-day cardiac event monitor.  Will plan follow-up after cardiac event monitor.  Today he denies chest  pain, shortness of breath, lower extremity edema, fatigue, palpitations, melena, hematuria, hemoptysis, diaphoresis, weakness, presyncope, syncope, orthopnea, and PND.   Past Medical History:  Diagnosis Date   Atrial flutter (HCC)    Atrial myxoma    Left   Benign neoplasm of heart 08/07/2011   Chronic right shoulder pain 02/14/2018   Chronic right-sided low back pain with right-sided sciatica 02/14/2018   Diabetes mellitus type II    DM2 (diabetes mellitus, type 2) (HCC)    HTN (hypertension)    Hyperlipidemia    Renal insufficiency     Unspecified pleural effusion 08/07/2011    Past Surgical History:  Procedure Laterality Date   A-FLUTTER ABLATION N/A 07/14/2022   Procedure: A-FLUTTER ABLATION;  Surgeon: Marinus Maw, MD;  Location: [facility] INVASIVE CV LAB;  Service: Cardiovascular;  Laterality: N/A;   CARDIAC CATHETERIZATION     CARDIOVERSION N/A 08/01/2021   Procedure: CARDIOVERSION;  Surgeon: Lewayne Bunting, MD;  Location: [facility] ENDOSCOPY;  Service: Cardiovascular;  Laterality: N/A;   cardioversion of atrial flutter.  07/31/11   Right miniature thoracotomy for resection of left atrial myxoma.  06/09/11   Dr Cornelius Moras    Current Medications: Current Meds  Medication Sig   aspirin EC 81 MG tablet Take 1 tablet (81 mg total) by mouth daily. Swallow whole.   benazepril (LOTENSIN) 40 MG tablet Take 1 tablet (40 mg total) by mouth daily.   Continuous Blood Gluc Sensor (FREESTYLE LIBRE 2 SENSOR) MISC    desoximetasone (TOPICORT) 0.05 % cream Apply 1 Application topically daily as needed (Dry skin).   esomeprazole (NEXIUM) 40 MG capsule Take 40 mg by mouth daily before breakfast.     fluticasone (FLONASE) 50 MCG/ACT nasal spray Place 1 spray into both nostrils daily.   fluticasone furoate-vilanterol (BREO ELLIPTA) 100-25 MCG/ACT AEPB Inhale 1 puff into the lungs daily.   HUMALOG KWIKPEN 200 UNIT/ML SOPN Inject 40 Units as directed 2 (two) times daily.   Insulin Glargine (BASAGLAR KWIKPEN) 100 UNIT/ML SOPN Inject 60 Units into the skin 2 (two) times daily.   Insulin Pen Needle (BD PEN NEEDLE NANO U/F) 32G X 4 MM MISC Use 7 needles daily with Basaglar and Humalog. for 100 days   JARDIANCE 10 MG TABS tablet Take 10 mg by mouth daily.   metolazone (ZAROXOLYN) 2.5 MG tablet Take 2.5 mg by mouth daily as needed (Fluid).   minoxidil (LONITEN) 10 MG tablet Take 2 tablets (20 mg total) by mouth 2 (two) times daily.   nebivolol (BYSTOLIC) 10 MG tablet Take 1 tablet (10 mg total) by mouth daily.   OZEMPIC, 1 MG/DOSE, 4 MG/3ML SOPN Inject 1 mg  into the skin once a week.   potassium chloride SA (KLOR-CON M20) 20 MEQ tablet Take 1 tablet (20 mEq total) by mouth daily.   rosuvastatin (CRESTOR) 10 MG tablet Take 10 mg by mouth daily.   sildenafil (VIAGRA) 100 MG tablet Take 100 mg by mouth as needed.   tamsulosin (FLOMAX) 0.4 MG CAPS capsule Take 0.4 mg by mouth at bedtime.   torsemide (DEMADEX) 20 MG tablet Take 2 tablets (40 mg total) by mouth 2 (two) times daily. TAKE 3 TABLETS IN THE MORNING AND 2 TABLET AT NIGHT     Allergies:   Patient has no known allergies.   Social History   Socioeconomic History   Marital status: Married    Spouse name: Not on file   Number of children: Not on file   Years of education: Not on  file   Highest education level: Not on file  Occupational History   Occupation: Office manager: SELF-EMPLOYED  Tobacco Use   Smoking status: Former    Current packs/day: 0.00    Types: Cigarettes    Quit date: 11/20/2006    Years since quitting: 16.7   Smokeless tobacco: Never  Vaping Use   Vaping status: Never Used  Substance and Sexual Activity   Alcohol use: No   Drug use: No   Sexual activity: Not on file  Other Topics Concern   Not on file  Social History Narrative   Not on file   Social Determinants of Health   Financial Resource Strain: Not on file  Food Insecurity: Not on file  Transportation Needs: Not on file  Physical Activity: Not on file  Stress: Not on file  Social Connections: Not on file     Family History: The patient's family history includes CAD in his father.  ROS:   Please see the history of present illness.     All other systems reviewed and are negative.   Risk Assessment/Calculations:           Physical Exam:    VS:  BP 136/66 (BP Location: Left Arm, Patient Position: Sitting, Cuff Size: Normal)   Pulse 72   Ht 5\' 11"  (1.803 m)   Wt 245 lb 9.6 oz (111.4 kg)   SpO2 93%   BMI 34.25 kg/m     Wt Readings from Last 3 Encounters:  07/30/23 245 lb 9.6  oz (111.4 kg)  03/16/23 249 lb 9.6 oz (113.2 kg)  08/25/22 264 lb (119.7 kg)     GEN:  Well nourished, well developed in no acute distress HEENT: Normal NECK: No JVD; No carotid bruits LYMPHATICS: No lymphadenopathy CARDIAC: RRR, no murmurs, rubs, gallops, generalized bilateral lower extremity nonpitting edema RESPIRATORY:  Clear to auscultation without rales, wheezing or rhonchi  ABDOMEN: Soft, non-tender, non-distended MUSCULOSKELETAL:  No edema; No deformity  SKIN: Warm and dry NEUROLOGIC:  Alert and oriented x 3 PSYCHIATRIC:  Normal affect    EKGs/Labs/Other Studies Reviewed:    The following studies were reviewed today:  Echocardiogram 07/29/2021  IMPRESSIONS     1. Left ventricular ejection fraction, by estimation, is 50 to 55%. The  left ventricle has low normal function. The left ventricle has no regional  wall motion abnormalities. There is moderate left ventricular hypertrophy.  Left ventricular diastolic  parameters are indeterminate.   2. Right ventricular systolic function is mildly reduced. The right  ventricular size is normal. There is normal pulmonary artery systolic  pressure.   3. The mitral valve is normal in structure. Trivial mitral valve  regurgitation.   4. The aortic valve was not well visualized. Aortic valve regurgitation  is not visualized. No aortic stenosis is present.   5. The inferior vena cava is dilated in size with >50% respiratory  variability, suggesting right atrial pressure of 8 mmHg.  Echocardiogram 05/01/2022   1. Left ventricular ejection fraction, by estimation, is 50 to 55%. The  left ventricle has low normal function. The left ventricle has no regional  wall motion abnormalities. There is mild concentric left ventricular  hypertrophy. Left ventricular  diastolic parameters are indeterminate. The average left ventricular  global longitudinal strain is -11.2 %. The global longitudinal strain is  abnormal.   2. Right  ventricular systolic function is normal. The right ventricular  size is normal. There is mildly elevated pulmonary artery systolic  pressure.   3. The mitral valve is normal in structure. No evidence of mitral valve  regurgitation. No evidence of mitral stenosis.   4. The aortic valve is tricuspid. There is mild calcification of the  aortic valve. There is mild thickening of the aortic valve. Aortic valve  regurgitation is not visualized. No aortic stenosis is present.   5. The inferior vena cava is normal in size with greater than 50%  respiratory variability, suggesting right atrial pressure of 3 mmHg.    EKG: None today.  Recent Labs: No results found for requested labs within last 365 days.  Recent Lipid Panel    Component Value Date/Time   CHOL 91 07/29/2021 0339   TRIG 86 07/29/2021 0339   HDL 26 (L) 07/29/2021 0339   CHOLHDL 3.5 07/29/2021 0339   VLDL 17 07/29/2021 0339   LDLCALC 48 07/29/2021 0339    ASSESSMENT & PLAN    Chronic diastolic CHF-weight today 245.  No lower extremity edema.  NYHA class II. Continue torsemide,  potassium Heart healthy low-sodium diet-salty 6 given Increase physical activity as tolerated Daily weights-contact office with a weight increase of 2 pounds overnight or 5 pounds in 1 week Elevate lower extremities when not active Lower extremity support stockings-Riggins support stocking sheet given. Fluid restriction 48 to 64 ounces Order BMP  Essential hypertension-BP today 136/66.  Better controlled at home with being euvolemic. Continue amlodipine,  nebivolol  Restart Benazepril,torsemide Heart healthy low-sodium diet Increase physical activity as tolerated  Atrial flutter, atrial fibrillation-heart rate today 72.  Status post ablation.  Cardiac unaware.  No recent episodes of dizziness presyncope or syncope.  Reports compliance with apixaban and denies bleeding issues. Continue nebivolol, aspirin Heart healthy low-sodium  diet Increase physical activity as tolerated Avoid triggers caffeine, chocolate, EtOH, dehydration etc. Order 14 day event monitor Order BMP  Hyperlipidemia-35 on 03/21/2023 Continue rosuvastatin Heart healthy low-sodium high-fiber diet Increase physical activity as tolerated  History of left atrial myxoma-no recurrence noted on follow-up echocardiogram.  Disposition: Follow-up with Dr. Jacques Navy or APP in 8 weeks.       Medication Adjustments/Labs and Tests Ordered: Current medicines are reviewed at length with the patient today.  Concerns regarding medicines are outlined above.  No orders of the defined types were placed in this encounter.  No orders of the defined types were placed in this encounter.   There are no Patient Instructions on file for this visit.   Signed, Ronney Asters, NP  07/30/2023 3:18 PM      Notice: This dictation was prepared with Dragon dictation along with smaller phrase technology. Any transcriptional errors that result from this process are unintentional and may not be corrected upon review.  I spent 14 minutes examining this patient, reviewing medications, and using patient centered shared decision making involving her cardiac care.  Prior to her visit I spent greater than 20 minutes reviewing her past medical history,  medications, and prior cardiac tests.

## 2023-07-30 ENCOUNTER — Encounter: Payer: Self-pay | Admitting: General Practice

## 2023-07-30 ENCOUNTER — Ambulatory Visit: Payer: Medicare Other | Attending: General Practice | Admitting: General Practice

## 2023-07-30 ENCOUNTER — Ambulatory Visit (INDEPENDENT_AMBULATORY_CARE_PROVIDER_SITE_OTHER): Payer: Medicare Other

## 2023-07-30 VITALS — BP 136/66 | HR 72 | Ht 71.0 in | Wt 245.6 lb

## 2023-07-30 DIAGNOSIS — I4892 Unspecified atrial flutter: Secondary | ICD-10-CM

## 2023-07-30 DIAGNOSIS — E785 Hyperlipidemia, unspecified: Secondary | ICD-10-CM

## 2023-07-30 DIAGNOSIS — I130 Hypertensive heart and chronic kidney disease with heart failure and stage 1 through stage 4 chronic kidney disease, or unspecified chronic kidney disease: Secondary | ICD-10-CM

## 2023-07-30 DIAGNOSIS — I10 Essential (primary) hypertension: Secondary | ICD-10-CM | POA: Diagnosis not present

## 2023-07-30 DIAGNOSIS — I5020 Unspecified systolic (congestive) heart failure: Secondary | ICD-10-CM

## 2023-07-30 DIAGNOSIS — I5032 Chronic diastolic (congestive) heart failure: Secondary | ICD-10-CM

## 2023-07-30 DIAGNOSIS — I5033 Acute on chronic diastolic (congestive) heart failure: Secondary | ICD-10-CM

## 2023-07-30 MED ORDER — POTASSIUM CHLORIDE CRYS ER 20 MEQ PO TBCR: 20.0000 meq | EXTENDED_RELEASE_TABLET | Freq: Every day | ORAL | 3 refills | Status: DC

## 2023-07-30 NOTE — Progress Notes (Unsigned)
Enrolled for [employer] to mail a ZIO XT long term holter monitor to the patients address on file.   Dr. Acharya to read. 

## 2023-07-30 NOTE — Patient Instructions (Signed)
Medication Instructions:  The current medical regimen is effective;  continue present plan and medications as directed. Please refer to the Current Medication list given to you today.  *If you need a refill on your cardiac medications before your next appointment, please call your pharmacy*  Lab Work: Bmet today If you have labs (blood work) drawn today and your tests are completely normal, you will receive your results only by:  MyChart Message (if you have MyChart) OR  A paper copy in the mail If you have any lab test that is abnormal or we need to change your treatment, we will call you to review the results.  Testing/Procedures: Your physician has requested you wear a ZIO patch monitor for 14 days.-see detailed instructions below  Other Instructions Take and log your weight daily and bring log with you to your follow up appointment  Follow-Up: At [facility], you and your health needs are our priority.  As part of our continuing mission to provide you with exceptional heart care, we have created designated Provider Care Teams.  These Care Teams include your primary Cardiologist (physician) and Advanced Practice Providers (APPs -  Physician Assistants and Nurse Practitioners) who all work together to provide you with the care you need, when you need it.  Your next appointment:   8 week(s)  Provider:   Parke Poisson, MD  or Edd Fabian, FNP        Christena Deem- Long Term Monitor Instructions  Your physician has requested you wear a ZIO patch monitor for 14 days.  This is a single patch monitor. [employer] supplies one patch monitor per enrollment. Additional stickers are not available. Please do not apply patch if you will be having a Nuclear Stress Test,  Echocardiogram, Cardiac CT, MRI, or Chest Xray during the period you would be wearing the  monitor. The patch cannot be worn during these tests. You cannot remove and re-apply the  ZIO XT patch monitor.  Your ZIO patch  monitor will be mailed 3 day USPS to your address on file. It may take 3-5 days  to receive your monitor after you have been enrolled.  Once you have received your monitor, please review the enclosed instructions. Your monitor  has already been registered assigning a specific monitor serial # to you.  Billing and Patient Assistance Program Information  We have supplied [employer] with any of your insurance information on file for billing purposes. [employer] offers a sliding scale Patient Assistance Program for patients that do not have  insurance, or whose insurance does not completely cover the cost of the ZIO monitor.  You must apply for the Patient Assistance Program to qualify for this discounted rate.  To apply, please call [employer] at [phone], select option 4, select option 2, ask to apply for  Patient Assistance Program. Meredeth Ide will ask your household income, and how many people  are in your household. They will quote your out-of-pocket cost based on that information.  [employer] will also be able to set up a 72-month, interest-free payment plan if needed.  Applying the monitor   Shave hair from upper left chest.  Hold abrader disc by orange tab. Rub abrader in 40 strokes over the upper left chest as  indicated in your monitor instructions.  Clean area with 4 enclosed alcohol pads. Let dry.  Apply patch as indicated in monitor instructions. Patch will be placed under collarbone on left  side of chest with arrow pointing upward.  Rub patch  adhesive wings for 2 minutes. Remove white label marked "1". Remove the white  label marked "2". Rub patch adhesive wings for 2 additional minutes.  While looking in a mirror, press and release button in center of patch. A small green light will  flash 3-4 times. This will be your only indicator that the monitor has been turned on.  Do not shower for the first 24 hours. You may shower after the first 24 hours.  Press the button if you feel a  symptom. You will hear a small click. Record Date, Time and  Symptom in the Patient Logbook.  When you are ready to remove the patch, follow instructions on the last 2 pages of Patient  Logbook. Stick patch monitor onto the last page of Patient Logbook.  Place Patient Logbook in the blue and white box. Use locking tab on box and tape box closed  securely. The blue and white box has prepaid postage on it. Please place it in the mailbox as  soon as possible. Your physician should have your test results approximately 7 days after the  monitor has been mailed back to [facility].  Call [facility] Customer Care at [phone] if you have questions regarding  your ZIO XT patch monitor. Call them immediately if you see an orange light blinking on your  monitor.  If your monitor falls off in less than 4 days, contact our Monitor department at [phone].  If your monitor becomes loose or falls off after 4 days call [employer] at [phone] for  suggestions on securing your monitor

## 2023-07-31 LAB — BASIC METABOLIC PANEL
BUN/Creatinine Ratio: 15 (ref 10–24)
BUN: 28 mg/dL — ABNORMAL HIGH (ref 8–27)
CO2: 28 mmol/L (ref 20–29)
Calcium: 9.5 mg/dL (ref 8.6–10.2)
Chloride: 95 mmol/L — ABNORMAL LOW (ref 96–106)
Creatinine, Ser: 1.9 mg/dL — ABNORMAL HIGH (ref 0.76–1.27)
Glucose: 133 mg/dL — ABNORMAL HIGH (ref 70–99)
Potassium: 3.3 mmol/L — ABNORMAL LOW (ref 3.5–5.2)
Sodium: 141 mmol/L (ref 134–144)
eGFR: 39 mL/min/{1.73_m2} — ABNORMAL LOW (ref >59)

## 2023-08-01 DIAGNOSIS — I4892 Unspecified atrial flutter: Secondary | ICD-10-CM | POA: Diagnosis not present

## 2023-08-03 ENCOUNTER — Other Ambulatory Visit: Payer: Self-pay

## 2023-10-02 NOTE — Progress Notes (Unsigned)
Cardiology Office Note:    Date:  10/04/2023   ID:  Richard Barton, DOB [birth year], MRN [id number]  PCP:  Gaspar Garbe, MD   [facility] Providers Cardiologist:  Parke Poisson, MD Sleep Medicine:  Nicki Guadalajara, MD      Referring MD: Gaspar Garbe, MD   Follow-up for lower extremity edema and acute on chronic diastolic CHF.  History of Present Illness:    Richard Barton is a 67 y.o. male with a hx of left atrial myxoma, hypertensive heart and kidney disease, chronic diastolic CHF, atrial flutter, CKD stage III, and hyperlipidemia.  He sent a  MyChart message which was reviewed on 01/16/2022.  He reported a 25 pound weight increase over 4 weeks.  He reported he was taking his 40 mg torsemide twice daily as prescribed.  He was contacted by nursing staff who recorded a blood pressure of 153/71 and a pulse of 67.  He was prescribed Zaroxolyn 5 mg daily for 5 days.  His BMP showed a creatinine increased to 2.89.  Baseline creatinine around 1.58-1.79.  He was contacted by Dr. Shari Prows on 01/21/2022 to review lab results.  He reported that his weight had decreased.  His renal function was reviewed.  Decreased renal function was expected in the setting of metolazone.  It was recommended that he hold his torsemide until Tuesday.  His benzapril was also placed on hold.  P.o. magnesium was recommended for muscle cramps.  He presented to the clinic 01/25/22 for follow-up evaluation and stated he was feeling much better .  He did note some aches in his shoulders and hips during Zaroxolyn treatment.  He also reported that his blood pressure came down to 90s over 60s/70s.  His blood pressure had been better since holding his benazepril.  We reviewed the importance of avoiding salt, fluid restriction, restarted his torsemide, restarted his benazepril, and planned follow-up for 3 to 4 months.  He was last seen in follow-up by Dr. Jacques Navy on 03/16/2023.  He had also been seen in follow-up by  Dr. Ladona Ridgel 08/25/2022.  He underwent cardioversion 08/01/2021.  He underwent ablation procedure 07/14/2022.  He continued to maintain sinus rhythm.  His Eliquis was discontinued and he was started on ASA.  During his follow-up with Dr. Jacques Navy he reported that he was breathing well and feeling much better.  He felt that his ablation was helping with this.  He felt like he had returned to his baseline a few months prior.  He noted that he had only used metolazone 1 time post ablation.  He was monitoring his sodium intake and fluid retention.  He had been taking Ozempic for about 2 months.  His Jardiance was discontinued.  He noted that his blood pressure and appetite have both decreased.  He continued to use his CPAP and was tolerating it well.  He denied chest pain and palpitations.  He presented to the clinic 07/30/23 for follow-up evaluation and stated he had noticed increased weight and abdominal area distention/swelling.  He took 1/4 tablet of metolazone and lost around 10 pounds.  His weight was 245.6 pounds.  His blood pressure was 136/66.  I reviewed his most recent echocardiogram.  He expressed understanding.  He reported episodes of palpitations that he associated with chest pressure.  I  ordered a BMP had him continue to weigh himself daily, and ordered 14-day cardiac event monitor.   Cardiac event monitor results have not yet resulted.  He presents to  the clinic today and reports that he has been having trouble sleeping due to difficulty with his CPAP mask.  He has been trying to contact the supplier and has not had any luck.  He occasionally feels lightheaded.  He feels occasional heart flutters.  His fluid status has been well-controlled.  He also notices occasional right sided chest discomfort.  He reports that this is sometimes exertional.  He continues to walk regularly.  We reviewed preliminary results of his cardiac event monitor.  He reports heart rates in the 30s.  He presented to his PCP and  his Bystolic was reduced to 5 mg daily.  I will refer back to EP for further recommendations and evaluation.  We will plan follow-up with general cardiology in 6 months.  Today he denies chest pain, shortness of breath, lower extremity edema,  palpitations, melena, hematuria, hemoptysis, diaphoresis, weakness, presyncope, syncope, orthopnea, and PND.   Past Medical History:  Diagnosis Date   Atrial flutter (HCC)    Atrial myxoma    Left   Benign neoplasm of heart 08/07/2011   Chronic right shoulder pain 02/14/2018   Chronic right-sided low back pain with right-sided sciatica 02/14/2018   Diabetes mellitus type II    DM2 (diabetes mellitus, type 2) (HCC)    HTN (hypertension)    Hyperlipidemia    Renal insufficiency    Unspecified pleural effusion 08/07/2011    Past Surgical History:  Procedure Laterality Date   A-FLUTTER ABLATION N/A 07/14/2022   Procedure: A-FLUTTER ABLATION;  Surgeon: Marinus Maw, MD;  Location: [facility] INVASIVE CV LAB;  Service: Cardiovascular;  Laterality: N/A;   CARDIAC CATHETERIZATION     CARDIOVERSION N/A 08/01/2021   Procedure: CARDIOVERSION;  Surgeon: Lewayne Bunting, MD;  Location: [facility] ENDOSCOPY;  Service: Cardiovascular;  Laterality: N/A;   cardioversion of atrial flutter.  07/31/11   Right miniature thoracotomy for resection of left atrial myxoma.  06/09/11   Dr Cornelius Moras    Current Medications: Current Meds  Medication Sig   amLODipine (NORVASC) 2.5 MG tablet Take 1 tablet (2.5 mg total) by mouth daily.   aspirin EC 81 MG tablet Take 1 tablet (81 mg total) by mouth daily. Swallow whole.   benazepril (LOTENSIN) 40 MG tablet Take 1 tablet (40 mg total) by mouth daily.   Camphor-Eucalyptus-Menthol (VICKS VAPORUB EX) Apply 1 Application topically daily as needed (Legs and feet).   Continuous Blood Gluc Sensor (FREESTYLE LIBRE 2 SENSOR) MISC    desoximetasone (TOPICORT) 0.05 % cream Apply 1 Application topically daily as needed (Dry skin).   esomeprazole (NEXIUM)  40 MG capsule Take 40 mg by mouth daily before breakfast.     fluticasone (FLONASE) 50 MCG/ACT nasal spray Place 1 spray into both nostrils daily.   fluticasone furoate-vilanterol (BREO ELLIPTA) 100-25 MCG/ACT AEPB Inhale 1 puff into the lungs daily.   HUMALOG KWIKPEN 200 UNIT/ML SOPN Inject 40 Units as directed 2 (two) times daily.   insulin glargine (LANTUS) 100 UNIT/ML injection Inject 60 Units into the skin 2 (two) times daily.   Insulin Pen Needle (BD PEN NEEDLE NANO U/F) 32G X 4 MM MISC Use 7 needles daily with Basaglar and Humalog. for 100 days   JARDIANCE 10 MG TABS tablet Take 10 mg by mouth daily.   MAGNESIUM PO Take 1 tablet by mouth daily.   metolazone (ZAROXOLYN) 2.5 MG tablet Take 2.5 mg by mouth daily as needed (Fluid).   minoxidil (LONITEN) 10 MG tablet Take 2 tablets (20 mg total) by  mouth 2 (two) times daily.   nebivolol (BYSTOLIC) 10 MG tablet Take 1 tablet (10 mg total) by mouth daily. (Patient taking differently: Take 5 mg by mouth daily.)   Oxymetazoline HCl (VICKS SINEX 12 HOUR NA) Place 1 spray into the nose daily as needed (Congestion).   OZEMPIC, 1 MG/DOSE, 4 MG/3ML SOPN Inject 1 mg into the skin once a week.   potassium chloride SA (KLOR-CON M20) 20 MEQ tablet Take 1 tablet (20 mEq total) by mouth daily.   rosuvastatin (CRESTOR) 10 MG tablet Take 10 mg by mouth daily.   sildenafil (VIAGRA) 100 MG tablet Take 100 mg by mouth as needed.   sitaGLIPtin (JANUVIA) 100 MG tablet Take 100 mg by mouth daily.   tamsulosin (FLOMAX) 0.4 MG CAPS capsule Take 0.4 mg by mouth at bedtime.   torsemide (DEMADEX) 20 MG tablet Take 2 tablets (40 mg total) by mouth 2 (two) times daily. TAKE 3 TABLETS IN THE MORNING AND 2 TABLET AT NIGHT   [DISCONTINUED] Insulin Glargine (BASAGLAR KWIKPEN) 100 UNIT/ML SOPN Inject 60 Units into the skin 2 (two) times daily.     Allergies:   Patient has no known allergies.   Social History   Socioeconomic History   Marital status: Married    Spouse  name: Not on file   Number of children: Not on file   Years of education: Not on file   Highest education level: Not on file  Occupational History   Occupation: Office manager: SELF-EMPLOYED  Tobacco Use   Smoking status: Former    Current packs/day: 0.00    Types: Cigarettes    Quit date: 11/20/2006    Years since quitting: 16.8   Smokeless tobacco: Never  Vaping Use   Vaping status: Never Used  Substance and Sexual Activity   Alcohol use: No   Drug use: No   Sexual activity: Not on file  Other Topics Concern   Not on file  Social History Narrative   Not on file   Social Determinants of Health   Financial Resource Strain: Not on file  Food Insecurity: Not on file  Transportation Needs: Not on file  Physical Activity: Not on file  Stress: Not on file  Social Connections: Not on file     Family History: The patient's family history includes CAD in his father.  ROS:   Please see the history of present illness.     All other systems reviewed and are negative.   Risk Assessment/Calculations:           Physical Exam:    VS:  BP (!) 118/52 (BP Location: Left Arm, Patient Position: Sitting, Cuff Size: Large)   Ht 5' 11.5" (1.816 m)   Wt 231 lb (104.8 kg)   BMI 31.77 kg/m     Wt Readings from Last 3 Encounters:  10/04/23 231 lb (104.8 kg)  07/30/23 245 lb 9.6 oz (111.4 kg)  03/16/23 249 lb 9.6 oz (113.2 kg)     GEN:  Well nourished, well developed in no acute distress HEENT: Normal NECK: No JVD; No carotid bruits LYMPHATICS: No lymphadenopathy CARDIAC: RRR, no murmurs, rubs, gallops, no edema RESPIRATORY:  Clear to auscultation without rales, wheezing or rhonchi  ABDOMEN: Soft, non-tender, non-distended MUSCULOSKELETAL:  No edema; No deformity  SKIN: Warm and dry NEUROLOGIC:  Alert and oriented x 3 PSYCHIATRIC:  Normal affect    EKGs/Labs/Other Studies Reviewed:    The following studies were reviewed today:  Echocardiogram  07/29/2021  IMPRESSIONS     1. Left ventricular ejection fraction, by estimation, is 50 to 55%. The  left ventricle has low normal function. The left ventricle has no regional  wall motion abnormalities. There is moderate left ventricular hypertrophy.  Left ventricular diastolic  parameters are indeterminate.   2. Right ventricular systolic function is mildly reduced. The right  ventricular size is normal. There is normal pulmonary artery systolic  pressure.   3. The mitral valve is normal in structure. Trivial mitral valve  regurgitation.   4. The aortic valve was not well visualized. Aortic valve regurgitation  is not visualized. No aortic stenosis is present.   5. The inferior vena cava is dilated in size with >50% respiratory  variability, suggesting right atrial pressure of 8 mmHg.  Echocardiogram 05/01/2022   1. Left ventricular ejection fraction, by estimation, is 50 to 55%. The  left ventricle has low normal function. The left ventricle has no regional  wall motion abnormalities. There is mild concentric left ventricular  hypertrophy. Left ventricular  diastolic parameters are indeterminate. The average left ventricular  global longitudinal strain is -11.2 %. The global longitudinal strain is  abnormal.   2. Right ventricular systolic function is normal. The right ventricular  size is normal. There is mildly elevated pulmonary artery systolic  pressure.   3. The mitral valve is normal in structure. No evidence of mitral valve  regurgitation. No evidence of mitral stenosis.   4. The aortic valve is tricuspid. There is mild calcification of the  aortic valve. There is mild thickening of the aortic valve. Aortic valve  regurgitation is not visualized. No aortic stenosis is present.   5. The inferior vena cava is normal in size with greater than 50%  respiratory variability, suggesting right atrial pressure of 3 mmHg.    EKG: EKG Interpretation Date/Time:  Thursday October 04 2023 14:04:34 EST Ventricular Rate:  83 PR Interval:    QRS Duration:  100 QT Interval:  386 QTC Calculation: 453 R Axis:   42  Text Interpretation: Sinus rhythm with Premature ventricular complexes When compared with ECG of 10-May-2022 14:42, Current undetermined rhythm precludes rhythm comparison, needs review ST no longer elevated in Anterior leads Confirmed by Edd Fabian [phone]) on 10/04/2023 2:13:26 PM   Recent Labs: 07/30/2023: BUN 28; Creatinine, Ser 1.90; Potassium 3.3; Sodium 141  Recent Lipid Panel    Component Value Date/Time   CHOL 91 07/29/2021 0339   TRIG 86 07/29/2021 0339   HDL 26 (L) 07/29/2021 0339   CHOLHDL 3.5 07/29/2021 0339   VLDL 17 07/29/2021 0339   LDLCALC 48 07/29/2021 0339    ASSESSMENT & PLAN    Atrial flutter, atrial fibrillation-heart rate today 83 bpm.  He is status post ablation.  Continues to be cardiac unaware.  He denies recent episodes of dizziness presyncope or syncope.  Continues to be with apixaban and denies bleeding issues.  BMP showed improving glucose and improving renal function. Continue  aspirin Now taking 5 mg nebivolol daily due to bradycardia Heart healthy low-sodium diet Increase physical activity as tolerated Avoid triggers caffeine, chocolate, EtOH, dehydration etc. Await results of cardiac event monitor-reviewed preliminary results Follow-up with EP   Chronic diastolic CHF-weight today 231 lbs.  No lower extremity edema.  NYHA class II. Continue torsemide,  potassium Heart healthy low-sodium diet-salty 6 reviewed Increase physical activity as tolerated Daily weights-contact office with a weight increase of 2 pounds overnight or 5 pounds in 1 week Elevate lower extremities when  not active Lower extremity support stockings-Datto support stocking sheet given. Fluid restriction 48 to 64 ounces   Essential hypertension-BP today 118/52.  Better controlled at home with being euvolemic. Continue amlodipine,   Now  taking 5 mg nebivolol daily due to bradycardia Restart Benazepril,torsemide Heart healthy low-sodium diet Increase physical activity as tolerated  OSA-reports compliance with CPAP.  Not resting well due to CPAP mask. Will reach out to sleep team to help with supplies.  Disposition: Follow-up with Dr. Jacques Navy or me in 6 months.      Medication Adjustments/Labs and Tests Ordered: Current medicines are reviewed at length with the patient today.  Concerns regarding medicines are outlined above.  Orders Placed This Encounter  Procedures   EKG 12-Lead   No orders of the defined types were placed in this encounter.      Signed, Ronney Asters, NP  10/04/2023 2:34 PM      Notice: This dictation was prepared with Dragon dictation along with smaller phrase technology. Any transcriptional errors that result from this process are unintentional and may not be corrected upon review.  I spent 14 minutes examining this patient, reviewing medications, and using patient centered shared decision making involving her cardiac care.  Prior to her visit I spent greater than 20 minutes reviewing her past medical history,  medications, and prior cardiac tests.

## 2023-10-04 ENCOUNTER — Ambulatory Visit: Payer: Medicare Other | Attending: General Practice | Admitting: General Practice

## 2023-10-04 ENCOUNTER — Other Ambulatory Visit: Payer: Self-pay

## 2023-10-04 ENCOUNTER — Encounter: Payer: Self-pay | Admitting: General Practice

## 2023-10-04 VITALS — BP 118/52 | Ht 71.5 in | Wt 231.0 lb

## 2023-10-04 DIAGNOSIS — I4892 Unspecified atrial flutter: Secondary | ICD-10-CM

## 2023-10-04 DIAGNOSIS — R9431 Abnormal electrocardiogram [ECG] [EKG]: Secondary | ICD-10-CM | POA: Diagnosis not present

## 2023-10-04 DIAGNOSIS — I10 Essential (primary) hypertension: Secondary | ICD-10-CM | POA: Diagnosis not present

## 2023-10-04 DIAGNOSIS — I5032 Chronic diastolic (congestive) heart failure: Secondary | ICD-10-CM | POA: Diagnosis not present

## 2023-10-04 DIAGNOSIS — G4733 Obstructive sleep apnea (adult) (pediatric): Secondary | ICD-10-CM

## 2023-10-04 NOTE — Patient Instructions (Signed)
Medication Instructions:  The current medical regimen is effective;  continue present plan and medications as directed. Please refer to the Current Medication list given to you today.  *If you need a refill on your cardiac medications before your next appointment, please call your pharmacy*  Lab Work: NONE If you have labs (blood work) drawn today and your tests are completely normal, you will receive your results only by:  MyChart Message (if you have MyChart) OR  A paper copy in the mail If you have any lab test that is abnormal or we need to change your treatment, we will call you to review the results.  Other Instructions CONTINUE BLOOD PRESSURE AND WEIGHT LOG  Follow-Up: At [facility], you and your health needs are our priority.  As part of our continuing mission to provide you with exceptional heart care, we have created designated Provider Care Teams.  These Care Teams include your primary Cardiologist (physician) and Advanced Practice Providers (APPs -  Physician Assistants and Nurse Practitioners) who all work together to provide you with the care you need, when you need it.  Your next appointment:   6 month(s);   SCHEDULE APPOINTMENT WITH DR TAYLOR/AFIB  Provider:   Parke Poisson, MD  or Edd Fabian, FNP

## 2023-10-04 NOTE — Progress Notes (Signed)
Patient's previous DME company is no longer accepting his insurance. I am sending his CPAP machine and supppoly prescription to [facility] today for supplies.

## 2023-10-10 ENCOUNTER — Telehealth: Payer: Self-pay | Admitting: *Deleted

## 2023-10-10 NOTE — Telephone Encounter (Signed)
Called left message for patient to call back for results.   Initially  called home number - spoke to wife Corrie Dandy), she stated she would prefer patient received information.   Place message to schedule for patient to see Dr Ladona Ridgel ( EP)in follow up ( patient saw Dr Ladona Ridgel in 2023) per Verdon Cummins NP  note .

## 2023-10-10 NOTE — Telephone Encounter (Signed)
Patient is aware and has an appointment on 10/23/23 with Dr Ladona Ridgel

## 2023-10-10 NOTE — Telephone Encounter (Signed)
-----   Message from Ronney Asters sent at 10/07/2023 10:27 AM EST ----- Please contact Richard Barton and let him know that his cardiac event monitor has been officially reviewed.  He was noted to have frequent PVCs, and pauses with the longest being 3.2 seconds.  No atrial fibrillation or flutter was noted.  We will continue with plan for referral to EP.  Thank you.

## 2023-10-23 ENCOUNTER — Ambulatory Visit: Payer: Medicare Other | Attending: Internal Medicine | Admitting: Internal Medicine

## 2023-10-23 ENCOUNTER — Encounter: Payer: Self-pay | Admitting: Internal Medicine

## 2023-10-23 VITALS — BP 116/62 | HR 74 | Ht 71.5 in | Wt 231.0 lb

## 2023-10-23 DIAGNOSIS — I493 Ventricular premature depolarization: Secondary | ICD-10-CM

## 2023-10-23 MED ORDER — MEXILETINE HCL 150 MG PO CAPS: 150.0000 mg | ORAL_CAPSULE | Freq: Two times a day (BID) | ORAL | 3 refills | Status: DC

## 2023-10-23 NOTE — Progress Notes (Signed)
HPI [name] returns today after catheter ablation of atrial flutter. He has a h/o left atrial myxoma s/p resection, who has developed atrial flutter 11 years later. He has had DCCV but had return of the atrial flutter. He has class 2 dyspnea and has preserved LV function. He has no chest pain. He has been bothered by peripheral edema.   No Known Allergies   Current Outpatient Medications  Medication Sig Dispense Refill   amLODipine (NORVASC) 2.5 MG tablet Take 1 tablet (2.5 mg total) by mouth daily. 90 tablet 3   aspirin EC 81 MG tablet Take 1 tablet (81 mg total) by mouth daily. Swallow whole. 60 tablet 5   benazepril (LOTENSIN) 40 MG tablet Take 1 tablet (40 mg total) by mouth daily. 90 tablet 1   Camphor-Eucalyptus-Menthol (VICKS VAPORUB EX) Apply 1 Application topically daily as needed (Legs and feet).     Continuous Blood Gluc Sensor (FREESTYLE LIBRE 2 SENSOR) MISC      desoximetasone (TOPICORT) 0.05 % cream Apply 1 Application topically daily as needed (Dry skin).     esomeprazole (NEXIUM) 40 MG capsule Take 40 mg by mouth daily before breakfast.       fluticasone (FLONASE) 50 MCG/ACT nasal spray Place 1 spray into both nostrils daily.     fluticasone furoate-vilanterol (BREO ELLIPTA) 100-25 MCG/ACT AEPB Inhale 1 puff into the lungs daily.     HUMALOG KWIKPEN 200 UNIT/ML SOPN Inject 40 Units as directed 2 (two) times daily.     insulin glargine (LANTUS) 100 UNIT/ML injection Inject 60 Units into the skin 2 (two) times daily.     Insulin Pen Needle (BD PEN NEEDLE NANO U/F) 32G X 4 MM MISC Use 7 needles daily with Basaglar and Humalog. for 100 days     JARDIANCE 10 MG TABS tablet Take 10 mg by mouth daily.     MAGNESIUM PO Take 1 tablet by mouth daily.     metolazone (ZAROXOLYN) 2.5 MG tablet Take 2.5 mg by mouth daily as needed (Fluid).     minoxidil (LONITEN) 10 MG tablet Take 2 tablets (20 mg total) by mouth 2 (two) times daily. 180 tablet 2   nebivolol (BYSTOLIC) 10 MG  tablet Take 1 tablet (10 mg total) by mouth daily. (Patient taking differently: Take 5 mg by mouth daily.) 90 tablet 3   Oxymetazoline HCl (VICKS SINEX 12 HOUR NA) Place 1 spray into the nose daily as needed (Congestion).     OZEMPIC, 1 MG/DOSE, 4 MG/3ML SOPN Inject 1 mg into the skin once a week.     potassium chloride SA (KLOR-CON M20) 20 MEQ tablet Take 1 tablet (20 mEq total) by mouth daily. 90 tablet 3   rosuvastatin (CRESTOR) 10 MG tablet Take 10 mg by mouth daily.     sildenafil (VIAGRA) 100 MG tablet Take 100 mg by mouth as needed.     tamsulosin (FLOMAX) 0.4 MG CAPS capsule Take 0.4 mg by mouth at bedtime.     torsemide (DEMADEX) 20 MG tablet Take 2 tablets (40 mg total) by mouth 2 (two) times daily. TAKE 3 TABLETS IN THE MORNING AND 2 TABLET AT NIGHT 150 tablet 5   No current facility-administered medications for this visit.     Past Medical History:  Diagnosis Date   Atrial flutter [facility])    Atrial myxoma    Left   Benign neoplasm of heart 08/07/2011   Chronic right shoulder pain 02/14/2018   Chronic right-sided low  back pain with right-sided sciatica 02/14/2018   Diabetes mellitus type II    DM2 (diabetes mellitus, type 2) (HCC)    HTN (hypertension)    Hyperlipidemia    Renal insufficiency    Unspecified pleural effusion 08/07/2011    ROS:   All systems reviewed and negative except as noted in the HPI.   Past Surgical History:  Procedure Laterality Date   A-FLUTTER ABLATION N/A 07/14/2022   Procedure: A-FLUTTER ABLATION;  Surgeon: Marinus Maw, MD;  Location: [facility] INVASIVE CV LAB;  Service: Cardiovascular;  Laterality: N/A;   CARDIAC CATHETERIZATION     CARDIOVERSION N/A 08/01/2021   Procedure: CARDIOVERSION;  Surgeon: Lewayne Bunting, MD;  Location: [facility] ENDOSCOPY;  Service: Cardiovascular;  Laterality: N/A;   cardioversion of atrial flutter.  07/31/11   Right miniature thoracotomy for resection of left atrial myxoma.  06/09/11   Dr Cornelius Moras     Family History   Problem Relation Age of Onset   CAD Father      Social History   Socioeconomic History   Marital status: Married    Spouse name: Not on file   Number of children: Not on file   Years of education: Not on file   Highest education level: Not on file  Occupational History   Occupation: Office manager: SELF-EMPLOYED  Tobacco Use   Smoking status: Former    Current packs/day: 0.00    Types: Cigarettes    Quit date: 11/20/2006    Years since quitting: 16.9   Smokeless tobacco: Never  Vaping Use   Vaping status: Never Used  Substance and Sexual Activity   Alcohol use: No   Drug use: No   Sexual activity: Not on file  Other Topics Concern   Not on file  Social History Narrative   Not on file   Social Determinants of Health   Financial Resource Strain: Not on file  Food Insecurity: Not on file  Transportation Needs: Not on file  Physical Activity: Not on file  Stress: Not on file  Social Connections: Not on file  Intimate Partner Violence: Not on file     BP 116/62   Pulse 74   Ht 5' 11.5" (1.816 m)   Wt 231 lb (104.8 kg)   SpO2 97%   BMI 31.77 kg/m   Physical Exam:  Well appearing NAD HEENT: Unremarkable Neck:  No JVD, no thyromegally Lymphatics:  No adenopathy Back:  No CVA tenderness Lungs:  Clear HEART:  Regular rate rhythm, no murmurs, no rubs, no clicks Abd:  soft, positive bowel sounds, no organomegally, no rebound, no guarding Ext:  2 plus pulses, no edema, no cyanosis, no clubbing Skin:  No rashes no nodules Neuro:  CN II through XII intact, motor grossly intact  EKG  DEVICE  Normal device function.  See PaceArt for details.   Assess/Plan:  Atrial flutter - he is doing well after EP study and catheter ablation and is maintaining NSR.  LA myxoma - he is s/p resection. No evidence of recurrence Diastolic heart failure - he is well compensated. No change in his meds. Multiple PVC's - while there was one dominant morphology, he had  multiple PVC's. I have discussed the treatment options. Mexitil, amio and a likely PPM and PVC ablation were all considered. The patient would like to pursue mexitil before considering more invasive options.  Pauses - he has not had symptoms.If we undergo amio initiation, then we would likely need a PPM.  Sharlot Gowda Xoie Kreuser,MD

## 2023-10-23 NOTE — Patient Instructions (Addendum)
Medication Instructions:  Your physician has recommended you make the following change in your medication:  Start mexiletine 150 mg twice daily for 2 weeks. After 2 weeks start 300 mg twice daily.  Lab Work: None ordered.  If you have labs (blood work) drawn today and your tests are completely normal, you will receive your results only by: MyChart Message (if you have MyChart) OR A paper copy in the mail If you have any lab test that is abnormal or we need to change your treatment, we will call you to review the results.  Testing/Procedures: None ordered.  Follow-Up: At [facility], you and your health needs are our priority.  As part of our continuing mission to provide you with exceptional heart care, we have created designated Provider Care Teams.  These Care Teams include your primary Cardiologist (physician) and Advanced Practice Providers (APPs -  Physician Assistants and Nurse Practitioners) who all work together to provide you with the care you need, when you need it.   Your next appointment:   4 weeks for EKG  The format for your next appointment:   In Person  Provider:   Lewayne Bunting, MD{or one of the following Advanced Practice Providers on your designated Care Team:   Francis Dowse, [place] Casimiro Needle "Mardelle Matte" Craig, [place] Earnest Rosier, NP  Important Information About Sugar

## 2023-11-20 ENCOUNTER — Ambulatory Visit: Payer: Medicare Other | Attending: Cardiology

## 2023-11-20 VITALS — Wt 233.4 lb

## 2023-11-20 DIAGNOSIS — Z79899 Other long term (current) drug therapy: Secondary | ICD-10-CM | POA: Diagnosis not present

## 2023-11-20 DIAGNOSIS — I493 Ventricular premature depolarization: Secondary | ICD-10-CM | POA: Diagnosis not present

## 2023-11-20 NOTE — Progress Notes (Signed)
   Nurse Visit   Date of Encounter: 11/20/2023 ID: Richard Barton, DOB 1956-03-22, MRN [id number]  PCP:  Tisovec, Richard W, MD    HeartCare Providers Cardiologist:  Soyla DELENA Merck, MD Sleep Medicine:  Debby Sor, MD      Visit Details   VS:  Wt 233 lb 6.4 oz (105.9 kg)   BMI 32.10 kg/m  , BMI Body mass index is 32.1 kg/m.  Wt Readings from Last 3 Encounters:  11/20/23 233 lb 6.4 oz (105.9 kg)  10/23/23 231 lb (104.8 kg)  10/04/23 231 lb (104.8 kg)     Reason for visit: EKG Performed today:   , Vitals, EKG, Provider consulted:Dr. Cindie, and Education Changes (medications, testing, etc.) : Dr. Cindie stated patient can decrease Mexitil to 150 mg BID if 300 mg is causing indigestion and heartburn. Length of Visit: 10 minutes    Medications Adjustments/Labs and Tests Ordered: Orders Placed This Encounter  Procedures   EKG 12-Lead   EKG 12-Lead     Signed, Deseri Loss JONELLE Kleine, LPN  87/68/7975 5:43 PM

## 2023-11-20 NOTE — Patient Instructions (Addendum)
 Patient states since increasing Mexitil to 300 mg  BID it has been causing heartburn and indigestion.  I discussed with Dr. Cindie and he suggested he decrease  from 300 mg BID to 150 mg BID.  Patient stated he will continue to try 300 mg BID and  see if the heartburn/indigestion goes away.

## 2023-11-22 DIAGNOSIS — K080 Exfoliation of teeth due to systemic causes: Secondary | ICD-10-CM | POA: Diagnosis not present

## 2023-11-23 ENCOUNTER — Telehealth: Payer: Self-pay | Admitting: Internal Medicine

## 2023-11-23 DIAGNOSIS — I5033 Acute on chronic diastolic (congestive) heart failure: Secondary | ICD-10-CM

## 2023-11-23 DIAGNOSIS — I130 Hypertensive heart and chronic kidney disease with heart failure and stage 1 through stage 4 chronic kidney disease, or unspecified chronic kidney disease: Secondary | ICD-10-CM

## 2023-11-23 NOTE — Telephone Encounter (Signed)
*  STAT* If patient is at the pharmacy, call can be transferred to refill team.   1. Which medications need to be refilled? (please list name of each medication and dose if known) aspirin  EC 81 MG tablet ; benazepril  (LOTENSIN ) 40 MG tablet ; mexiletine (MEXITIL) 150 MG capsule ; minoxidil  (LONITEN ) 10 MG tablet ; nebivolol  (BYSTOLIC ) 10 MG tablet ; potassium chloride  SA (KLOR-CON  M20) 20 MEQ tablet ; torsemide  (DEMADEX ) 20 MG tablet    2. Would you like to learn more about the convenience, safety, & potential cost savings by using the [facility]?     3. Are you open to using the [facility] (Type [facility]. ).   4. Which pharmacy/location (including street and city if local pharmacy) is medication to be sent to? [facility] - [place], [place] - [address]    5. Do they need a 30 day or 90 day supply? 90

## 2023-11-26 MED ORDER — TORSEMIDE 20 MG PO TABS: 40.0000 mg | ORAL_TABLET | Freq: Two times a day (BID) | ORAL | 3 refills | Status: DC

## 2023-11-26 MED ORDER — POTASSIUM CHLORIDE CRYS ER 20 MEQ PO TBCR: 20.0000 meq | EXTENDED_RELEASE_TABLET | Freq: Every day | ORAL | 3 refills | Status: AC

## 2023-11-26 MED ORDER — MEXILETINE HCL 150 MG PO CAPS: 150.0000 mg | ORAL_CAPSULE | Freq: Two times a day (BID) | ORAL | 3 refills | Status: DC

## 2023-11-26 MED ORDER — MINOXIDIL 10 MG PO TABS: 20.0000 mg | ORAL_TABLET | Freq: Two times a day (BID) | ORAL | 3 refills | Status: AC

## 2023-11-26 MED ORDER — NEBIVOLOL HCL 10 MG PO TABS: 10.0000 mg | ORAL_TABLET | Freq: Every day | ORAL | 3 refills | Status: DC

## 2023-11-26 MED ORDER — ASPIRIN 81 MG PO TBEC: 81.0000 mg | DELAYED_RELEASE_TABLET | Freq: Every day | ORAL | 3 refills | Status: AC

## 2023-11-26 MED ORDER — BENAZEPRIL HCL 40 MG PO TABS: 40.0000 mg | ORAL_TABLET | Freq: Every day | ORAL | 3 refills | Status: AC

## 2023-11-27 ENCOUNTER — Telehealth: Payer: Self-pay | Admitting: Internal Medicine

## 2023-11-27 MED ORDER — MEXILETINE HCL 150 MG PO CAPS: 300.0000 mg | ORAL_CAPSULE | Freq: Two times a day (BID) | ORAL | 3 refills | Status: DC

## 2023-11-27 NOTE — Telephone Encounter (Signed)
 Patient identification verified by 2 forms. Bertina Cooks, RN    Called and spoke to patient  Patient states:   -takes three 40mg  tablets in the morning and two 40 tablets at night time of Torsemide   -all [facility] were [facility]   -PCP has taken coverage of prescriptions and prescriptions were sent to [facility]   -only prescription not filled is Mexiletine 150mg    -His prescription is supposed to be for 150mg  BID for two weeks then increase to 300mg  BID   -he is currently taking Mexiletine 300mg  twice daily  -Would like all prescription sent to [facility] [phone] [address] [facility] [place]   -has not received any update on CPAP machine supplies  Informed patient:   -updated prescription for Mexiletine 300mg  BID sent to requested pharmacy   -per 10/04/23 orders only, CPAP machine and supply prescription sent to [facility] for supplies, previous DME company no longer accepting insurance   -message sent to sleep study team for update on supplies  During call patient voice frustration regarding prescription being sent to appropriate pharmacy and not being able to receive update regarding CPAP supply  Patient requests a call back regarding CPAP supplies   11/20/23 EKG visit note:

## 2023-11-27 NOTE — Telephone Encounter (Signed)
 Received call from Aloysius Stallion with [facility]  Aloysius states:   -pharmacy technician would like clarification on instruction for torsemide    -call back number is [phone] -Ref#: [phone] Informed Aloysius, message sent to Dr. Loni to clarify instructions for prescription

## 2023-11-27 NOTE — Telephone Encounter (Signed)
 [facility] are calling to talk with triage nurse in reference to getting clarification on the directions of the medication torsemide (DEMADEX) 20 MG tablet

## 2023-11-28 NOTE — Telephone Encounter (Signed)
 Notified patient that Christoper Allegra has received CPAP supply order and will reach out to patient to confirm. All questions were answered and patient verbalized understanding.

## 2023-11-29 ENCOUNTER — Telehealth: Payer: Self-pay | Admitting: Internal Medicine

## 2023-11-29 MED ORDER — MEXILETINE HCL 150 MG PO CAPS: 300.0000 mg | ORAL_CAPSULE | Freq: Two times a day (BID) | ORAL | 3 refills | Status: DC

## 2023-11-29 MED ORDER — TORSEMIDE 20 MG PO TABS: ORAL_TABLET | ORAL | 3 refills | Status: AC

## 2023-11-29 NOTE — Telephone Encounter (Signed)
 Patient returned call. Verified name and DOB. Patient calling concerning medications going to the incorrect pharmacy. Patient requesting all medications be sent to [name] mail order service.Patient stated he no longer uses [facility] pharmacy removed from list.

## 2023-11-29 NOTE — Addendum Note (Signed)
 Addended by: Kurtis Bushman on: 11/29/2023 12:24 PM   Modules accepted: Orders

## 2023-11-29 NOTE — Telephone Encounter (Signed)
 Pt c/o medication issue:  1. Name of Medication: torsemide  (DEMADEX ) 20 MG tablet   2. How are you currently taking this medication (dosage and times per day)? N/A  3. Are you having a reaction (difficulty breathing--STAT)? No   4. What is your medication issue? Pharmacy is needing clarification on instructions.

## 2023-12-03 DIAGNOSIS — H2510 Age-related nuclear cataract, unspecified eye: Secondary | ICD-10-CM | POA: Diagnosis not present

## 2023-12-03 DIAGNOSIS — H401133 Primary open-angle glaucoma, bilateral, severe stage: Secondary | ICD-10-CM | POA: Diagnosis not present

## 2023-12-03 DIAGNOSIS — H524 Presbyopia: Secondary | ICD-10-CM | POA: Diagnosis not present

## 2023-12-06 ENCOUNTER — Telehealth: Payer: Self-pay | Admitting: *Deleted

## 2023-12-06 DIAGNOSIS — G4733 Obstructive sleep apnea (adult) (pediatric): Secondary | ICD-10-CM | POA: Diagnosis not present

## 2023-12-06 NOTE — Telephone Encounter (Signed)
Called pt to verify amt of Torsemide that he is currently taking.  He is currently taking 60 mg in am and 40 mg in the evening. He states he has been on this dose for about the last nine months and this is followed closely by Dr. Carolynn Comment (Nephrology).  Dr. Lacy Duverney adjusts his dose as needed, per pt.    He states he has been feeling well, overall, but does continue to have episodes about two days per week when he feels his heart is irregular and he feels tired/weak.  These episodes last "about a few minutes to a little longer" each time.  He also voiced concern about the EKG/nurse visit that he had at the end of December at the [facility]. Location and he has not heard anything from that office.  He also needs to know when to f/u next with Dr. Ladona Ridgel, and that he was started on Mexitil and needs to know if this is working or not.    Will route to [address] Triage and RN.

## 2023-12-10 DIAGNOSIS — H401133 Primary open-angle glaucoma, bilateral, severe stage: Secondary | ICD-10-CM | POA: Diagnosis not present

## 2024-01-01 DIAGNOSIS — E1129 Type 2 diabetes mellitus with other diabetic kidney complication: Secondary | ICD-10-CM | POA: Diagnosis not present

## 2024-01-01 DIAGNOSIS — E1122 Type 2 diabetes mellitus with diabetic chronic kidney disease: Secondary | ICD-10-CM | POA: Diagnosis not present

## 2024-01-01 DIAGNOSIS — I130 Hypertensive heart and chronic kidney disease with heart failure and stage 1 through stage 4 chronic kidney disease, or unspecified chronic kidney disease: Secondary | ICD-10-CM | POA: Diagnosis not present

## 2024-01-01 DIAGNOSIS — I129 Hypertensive chronic kidney disease with stage 1 through stage 4 chronic kidney disease, or unspecified chronic kidney disease: Secondary | ICD-10-CM | POA: Diagnosis not present

## 2024-01-01 DIAGNOSIS — E876 Hypokalemia: Secondary | ICD-10-CM | POA: Diagnosis not present

## 2024-01-01 DIAGNOSIS — N183 Chronic kidney disease, stage 3 unspecified: Secondary | ICD-10-CM | POA: Diagnosis not present

## 2024-01-02 LAB — LAB REPORT - SCANNED
Creatinine, POC: 28.7 mg/dL
EGFR: 45

## 2024-01-09 DIAGNOSIS — K080 Exfoliation of teeth due to systemic causes: Secondary | ICD-10-CM | POA: Diagnosis not present

## 2024-03-06 DIAGNOSIS — G4733 Obstructive sleep apnea (adult) (pediatric): Secondary | ICD-10-CM | POA: Diagnosis not present

## 2024-03-13 ENCOUNTER — Ambulatory Visit (HOSPITAL_COMMUNITY): Attending: Internal Medicine

## 2024-03-13 DIAGNOSIS — I483 Typical atrial flutter: Secondary | ICD-10-CM | POA: Diagnosis not present

## 2024-03-14 LAB — ECHOCARDIOGRAM COMPLETE
AR max vel: 2.28 cm2
AV Area VTI: 2.31 cm2
AV Area mean vel: 2.23 cm2
AV Mean grad: 5.2 mmHg
AV Peak grad: 9.7 mmHg
Ao pk vel: 1.56 m/s
Area-P 1/2: 2.58 cm2
S' Lateral: 3.5 cm

## 2024-03-26 DIAGNOSIS — Z125 Encounter for screening for malignant neoplasm of prostate: Secondary | ICD-10-CM | POA: Diagnosis not present

## 2024-03-26 DIAGNOSIS — E7800 Pure hypercholesterolemia, unspecified: Secondary | ICD-10-CM | POA: Diagnosis not present

## 2024-03-26 DIAGNOSIS — Z1212 Encounter for screening for malignant neoplasm of rectum: Secondary | ICD-10-CM | POA: Diagnosis not present

## 2024-03-26 DIAGNOSIS — E1129 Type 2 diabetes mellitus with other diabetic kidney complication: Secondary | ICD-10-CM | POA: Diagnosis not present

## 2024-04-02 DIAGNOSIS — Z0000 Encounter for general adult medical examination without abnormal findings: Secondary | ICD-10-CM | POA: Diagnosis not present

## 2024-04-02 DIAGNOSIS — I130 Hypertensive heart and chronic kidney disease with heart failure and stage 1 through stage 4 chronic kidney disease, or unspecified chronic kidney disease: Secondary | ICD-10-CM | POA: Diagnosis not present

## 2024-04-02 DIAGNOSIS — R82998 Other abnormal findings in urine: Secondary | ICD-10-CM | POA: Diagnosis not present

## 2024-04-02 DIAGNOSIS — E1129 Type 2 diabetes mellitus with other diabetic kidney complication: Secondary | ICD-10-CM | POA: Diagnosis not present

## 2024-04-29 DIAGNOSIS — I129 Hypertensive chronic kidney disease with stage 1 through stage 4 chronic kidney disease, or unspecified chronic kidney disease: Secondary | ICD-10-CM | POA: Diagnosis not present

## 2024-04-29 DIAGNOSIS — E1122 Type 2 diabetes mellitus with diabetic chronic kidney disease: Secondary | ICD-10-CM | POA: Diagnosis not present

## 2024-04-29 DIAGNOSIS — N183 Chronic kidney disease, stage 3 unspecified: Secondary | ICD-10-CM | POA: Diagnosis not present

## 2024-04-29 DIAGNOSIS — E876 Hypokalemia: Secondary | ICD-10-CM | POA: Diagnosis not present

## 2024-05-06 DIAGNOSIS — K080 Exfoliation of teeth due to systemic causes: Secondary | ICD-10-CM | POA: Diagnosis not present

## 2024-06-02 DIAGNOSIS — H401133 Primary open-angle glaucoma, bilateral, severe stage: Secondary | ICD-10-CM | POA: Diagnosis not present

## 2024-06-05 DIAGNOSIS — G4733 Obstructive sleep apnea (adult) (pediatric): Secondary | ICD-10-CM | POA: Diagnosis not present

## 2024-06-27 DIAGNOSIS — H401133 Primary open-angle glaucoma, bilateral, severe stage: Secondary | ICD-10-CM | POA: Diagnosis not present

## 2024-06-30 ENCOUNTER — Ambulatory Visit: Payer: Self-pay | Admitting: Internal Medicine

## 2024-07-02 DIAGNOSIS — E1129 Type 2 diabetes mellitus with other diabetic kidney complication: Secondary | ICD-10-CM | POA: Diagnosis not present

## 2024-07-02 DIAGNOSIS — I130 Hypertensive heart and chronic kidney disease with heart failure and stage 1 through stage 4 chronic kidney disease, or unspecified chronic kidney disease: Secondary | ICD-10-CM | POA: Diagnosis not present

## 2024-07-09 ENCOUNTER — Telehealth: Payer: Self-pay | Admitting: Internal Medicine

## 2024-07-09 DIAGNOSIS — G4733 Obstructive sleep apnea (adult) (pediatric): Secondary | ICD-10-CM

## 2024-07-09 NOTE — Telephone Encounter (Signed)
 Pt returning nurses call from 8/12 regarding Echo results. Please advise

## 2024-07-09 NOTE — Telephone Encounter (Signed)
 Richard Barton LABOR, MD to Richard Buel BROCKS, RN    06/30/24  7:20 PM Result Note This echo is overall stable with no worrisome findings. I think the patient was intended to have follow up after the echo which did not occur. Please ensure he has follow up next spring if he is doing well. If concerns bring back sooner.  Given results. Pt reports that he is doing ok as far as he knows, but he was put on a medication- Mexitil and has not had a f/u appt since. So I think I am doing ok, but I am not the Doctor. He feels as if he has fallen between the cracks, due to noone giving him results and no follow up appt; also has not had follow up with Sleep.  Apologized to patient- appt made with Dr Richard- 09/22/24 and referral made to Dr Shlomo for sleep- due to Dr Burnard having retired.

## 2024-08-11 DIAGNOSIS — E119 Type 2 diabetes mellitus without complications: Secondary | ICD-10-CM | POA: Diagnosis not present

## 2024-08-11 DIAGNOSIS — I10 Essential (primary) hypertension: Secondary | ICD-10-CM | POA: Diagnosis not present

## 2024-08-11 DIAGNOSIS — H2512 Age-related nuclear cataract, left eye: Secondary | ICD-10-CM | POA: Diagnosis not present

## 2024-08-11 DIAGNOSIS — H401132 Primary open-angle glaucoma, bilateral, moderate stage: Secondary | ICD-10-CM | POA: Diagnosis not present

## 2024-09-01 DIAGNOSIS — E119 Type 2 diabetes mellitus without complications: Secondary | ICD-10-CM | POA: Diagnosis not present

## 2024-09-01 DIAGNOSIS — Z794 Long term (current) use of insulin: Secondary | ICD-10-CM | POA: Diagnosis not present

## 2024-09-01 DIAGNOSIS — H401112 Primary open-angle glaucoma, right eye, moderate stage: Secondary | ICD-10-CM | POA: Diagnosis not present

## 2024-09-01 DIAGNOSIS — H401132 Primary open-angle glaucoma, bilateral, moderate stage: Secondary | ICD-10-CM | POA: Diagnosis not present

## 2024-09-01 DIAGNOSIS — H2512 Age-related nuclear cataract, left eye: Secondary | ICD-10-CM | POA: Diagnosis not present

## 2024-09-03 DIAGNOSIS — K080 Exfoliation of teeth due to systemic causes: Secondary | ICD-10-CM | POA: Diagnosis not present

## 2024-09-18 ENCOUNTER — Encounter: Payer: Self-pay | Admitting: *Deleted

## 2024-09-22 ENCOUNTER — Ambulatory Visit: Attending: Internal Medicine | Admitting: Internal Medicine

## 2024-09-22 VITALS — BP 112/60 | HR 81 | Ht 71.5 in | Wt 224.0 lb

## 2024-09-22 DIAGNOSIS — I5032 Chronic diastolic (congestive) heart failure: Secondary | ICD-10-CM

## 2024-09-22 DIAGNOSIS — I5033 Acute on chronic diastolic (congestive) heart failure: Secondary | ICD-10-CM

## 2024-09-22 DIAGNOSIS — E119 Type 2 diabetes mellitus without complications: Secondary | ICD-10-CM | POA: Diagnosis not present

## 2024-09-22 DIAGNOSIS — I10 Essential (primary) hypertension: Secondary | ICD-10-CM | POA: Diagnosis not present

## 2024-09-22 DIAGNOSIS — E785 Hyperlipidemia, unspecified: Secondary | ICD-10-CM

## 2024-09-22 DIAGNOSIS — I493 Ventricular premature depolarization: Secondary | ICD-10-CM

## 2024-09-22 MED ORDER — NEBIVOLOL HCL 10 MG PO TABS: 5.0000 mg | ORAL_TABLET | Freq: Every day | ORAL | Status: AC

## 2024-09-22 MED ORDER — OZEMPIC (1 MG/DOSE) 4 MG/3ML ~~LOC~~ SOPN: 2.0000 mg | PEN_INJECTOR | SUBCUTANEOUS | Status: AC

## 2024-09-22 NOTE — Patient Instructions (Signed)
 Medication Instructions:  No Changes  Lab Work: None  Follow-Up: At [facility], you and your health needs are our priority.  As part of our continuing mission to provide you with exceptional heart care, our providers are all part of one team.  This team includes your primary Cardiologist (physician) and Advanced Practice Providers or APPs (Physician Assistants and Nurse Practitioners) who all work together to provide you with the care you need, when you need it.  Your next appointment:   6 month(s) (Due Mar 22, 2025; we will mail a reminder letter but please call or send a mychart message in Feb 2026 for appointment)  Provider:   Gayatri A Acharya, MD   Other Instructions Please call us  or send a MyChart message with any Cardiology related questions/concerns.  [phone].  Thank you!

## 2024-09-22 NOTE — Progress Notes (Signed)
 Cardiology Office Note:  .   Date:  09/22/2024  ID:  Richard Barton, DOB [birth year], MRN [id number] PCP: Vernadine Charlie ORN, MD  [facility] Providers Cardiologist:  Soyla DELENA Merck, MD Sleep Medicine:  Debby Sor, MD (Inactive)    History of Present Illness: Richard Barton is a 68 y.o. male.  Discussed the use of AI scribe software for clinical note transcription with the patient, who gave verbal consent to proceed.  History of Present Illness Richard Barton is a 68 year old male with atrial flutter and left atrial myxoma resection who presents for follow-up regarding his medication for PVCs.  He started mexilatine for PVCs, described as a 'double beat skip.' Recent EKGs show no PVCs, unlike a previous EKG from November 20, 2023, which showed pvcs. He feels the medication is effective, though it causes occasional heartburn, managed with daily omeprazole. We discussed use of tums or pepcid for occasional intractable gerd related to mexilitine use.   He underwent an ablation for atrial flutter in August 2023. Post-ablation, he began experiencing PVCs significant enough to require treatment. He feels well now and has not experienced significant symptoms since starting mexilatine.  His cardiovascular medications include amlodipine  2.5 mg daily, benazepril  40 mg daily, torsemide  60 mg in the morning and 40 mg at night, and nebivolol  5 mg daily, reduced from 10 mg due to low blood pressure symptoms. He occasionally feels lightheaded but not frequently.  He experiences occasional neck discomfort, less than once a month, causing shortness of breath and difficulty swallowing, which he associates with heartburn and notes has been more noticeable since starting mexilatine and Ozempic. I encouraged him to discuss this with PCP and GI.  He is active, engaging in gardening without experiencing lightheadedness, and feels he can 'go and go and go' without getting short of breath. He is pleased  with his current level of activity and overall health. No chest pain, shortness of breath, or significant dizziness during physical activity.    ROS: negative except per HPI above.  Studies Reviewed: [name]   EKG Interpretation Date/Time:  Monday September 22 2024 08:06:53 EST Ventricular Rate:  81 PR Interval:  320 QRS Duration:  100 QT Interval:  372 QTC Calculation: 432 R Axis:   43  Text Interpretation: Sinus rhythm with 1st degree A-V block T wave abnormality, consider inferolateral ischemia When compared with ECG of 20-Nov-2023 14:51, Premature ventricular complexes are no longer Present Non-specific change in ST segment in Anterior leads Confirmed by Merck Soyla ([id number]) on 09/22/2024 8:15:22 AM    Results LABS Hemoglobin A1c: 7.1% (03/2024) Lipid panel: LDL 29, Triglycerides 109, HDL 29 (09/22/2024) Hemoglobin: Normal (09/22/2024)  DIAGNOSTIC EKG: No PVCs (09/22/2024) Echocardiogram: Stable, normal pumping function, no recurrent myxoma (02/2024) Risk Assessment/Calculations:    CHA2DS2-VASc Score = 3   This indicates a 3.2% annual risk of stroke. The patient's score is based upon: CHF History: 0 HTN History: 1 Diabetes History: 1 Stroke History: 0 Vascular Disease History: 0 Age Score: 1 Gender Score: 0      Physical Exam:   VS:  BP 112/60 (BP Location: Left Arm, Patient Position: Sitting, Cuff Size: Large)   Pulse 81   Ht 5' 11.5 (1.816 m)   Wt 224 lb (101.6 kg)   SpO2 97%   BMI 30.81 kg/m    Wt Readings from Last 3 Encounters:  09/22/24 224 lb (101.6 kg)  11/20/23 233 lb 6.4 oz (105.9 kg)  10/23/23 231  lb (104.8 kg)     Physical Exam VITALS: BP- 112/60 GENERAL: Alert, cooperative, well developed, no acute distress HEENT: Normocephalic, normal oropharynx, moist mucous membranes CHEST: Clear to auscultation bilaterally, No wheezes, rhonchi, or crackles CARDIOVASCULAR: Normal heart rate and rhythm, S1 and S2 normal without murmurs, Neck arteries  normal ABDOMEN: Soft, non-tender, non-distended, without organomegaly, Normal bowel sounds EXTREMITIES: No cyanosis or edema NEUROLOGICAL: Cranial nerves grossly intact, Moves all extremities without gross motor or sensory deficit   ASSESSMENT AND PLAN: .    Assessment and Plan Assessment & Plan Premature ventricular contractions (PVCs) PVCs improved with mexiletine, no PVCs on current EKG. Occasional heartburn managed with omeprazole. - Continue mexiletine 300 mg BID. - consider famotidine for heartburn as needed. - Check for drug interactions with pharmacist - none found.  Chronic diastolic heart failure with preserved ejection fraction Condition well managed with torsemide  and Jardiance . Blood pressure controlled, weight loss improved symptoms. - Continue torsemide  60 mg in the morning and 40 mg at night. - Continue Jardiance  10 mg daily.  Essential hypertension Hypertension well controlled. Blood pressure 112/60 mmHg. Occasional lightheadedness not significant. - Continue amlodipine  2.5 mg daily. - Continue benazepril  40 mg daily. - Continue minoxidil  20 mg BID. - Continue nebivolol  5 mg daily.  Type 2 diabetes mellitus Diabetes managed with Jardiance , insulin , and semaglutide. Last HbA1c 7.1%, likely lower now due to weight loss. - Continue Jardiance  10 mg daily. - Continue semaglutide 2 mg weekly. - Continue insulin  as prescribed.  Hyperlipidemia Lipid levels well controlled with rosuvastatin . LDL 29 mg/dL, triglycerides 890 mg/dL, HDL 29 mg/dL. - Continue rosuvastatin  10 mg daily.  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 Heart function and valves stable. - Continue routine surveillance as needed.  Neck discomfort with intermittent shortness of breath and dysphagia, with heartburn Intermittent symptoms possibly related to heartburn or medication. No immediate cardiac cause identified. - Recommend follow-up with primary care  doctor for further evaluation. - Consider coronary CTA if symptoms increase in frequency.  Recording duration: 20 minutes      Kenneth Cuaresma, MD, FACC

## 2024-09-23 DIAGNOSIS — Z23 Encounter for immunization: Secondary | ICD-10-CM | POA: Diagnosis not present

## 2024-09-29 DIAGNOSIS — Z794 Long term (current) use of insulin: Secondary | ICD-10-CM | POA: Diagnosis not present

## 2024-09-29 DIAGNOSIS — E119 Type 2 diabetes mellitus without complications: Secondary | ICD-10-CM | POA: Diagnosis not present

## 2024-09-29 DIAGNOSIS — H2512 Age-related nuclear cataract, left eye: Secondary | ICD-10-CM | POA: Diagnosis not present

## 2024-09-29 DIAGNOSIS — H401132 Primary open-angle glaucoma, bilateral, moderate stage: Secondary | ICD-10-CM | POA: Diagnosis not present

## 2024-09-29 DIAGNOSIS — H401122 Primary open-angle glaucoma, left eye, moderate stage: Secondary | ICD-10-CM | POA: Diagnosis not present

## 2024-10-07 DIAGNOSIS — E1129 Type 2 diabetes mellitus with other diabetic kidney complication: Secondary | ICD-10-CM | POA: Diagnosis not present

## 2024-10-07 DIAGNOSIS — E1122 Type 2 diabetes mellitus with diabetic chronic kidney disease: Secondary | ICD-10-CM | POA: Diagnosis not present

## 2024-10-07 DIAGNOSIS — I130 Hypertensive heart and chronic kidney disease with heart failure and stage 1 through stage 4 chronic kidney disease, or unspecified chronic kidney disease: Secondary | ICD-10-CM | POA: Diagnosis not present

## 2024-10-07 DIAGNOSIS — E669 Obesity, unspecified: Secondary | ICD-10-CM | POA: Diagnosis not present

## 2024-10-27 ENCOUNTER — Other Ambulatory Visit: Payer: Self-pay

## 2024-10-28 DIAGNOSIS — N183 Chronic kidney disease, stage 3 unspecified: Secondary | ICD-10-CM | POA: Diagnosis not present

## 2024-10-28 DIAGNOSIS — E1122 Type 2 diabetes mellitus with diabetic chronic kidney disease: Secondary | ICD-10-CM | POA: Diagnosis not present

## 2024-10-28 DIAGNOSIS — I129 Hypertensive chronic kidney disease with stage 1 through stage 4 chronic kidney disease, or unspecified chronic kidney disease: Secondary | ICD-10-CM | POA: Diagnosis not present

## 2024-10-28 DIAGNOSIS — E876 Hypokalemia: Secondary | ICD-10-CM | POA: Diagnosis not present

## 2024-10-28 LAB — LAB REPORT - SCANNED: EGFR: 47

## 2024-10-30 ENCOUNTER — Other Ambulatory Visit: Payer: Self-pay | Admitting: Nephrology

## 2024-10-30 DIAGNOSIS — N183 Chronic kidney disease, stage 3 unspecified: Secondary | ICD-10-CM

## 2024-10-30 MED ORDER — MEXILETINE HCL 150 MG PO CAPS: 300.0000 mg | ORAL_CAPSULE | Freq: Two times a day (BID) | ORAL | 3 refills | Status: AC

## 2024-11-03 DIAGNOSIS — H401132 Primary open-angle glaucoma, bilateral, moderate stage: Secondary | ICD-10-CM | POA: Diagnosis not present

## 2024-11-03 DIAGNOSIS — I10 Essential (primary) hypertension: Secondary | ICD-10-CM | POA: Diagnosis not present

## 2024-11-03 DIAGNOSIS — E119 Type 2 diabetes mellitus without complications: Secondary | ICD-10-CM | POA: Diagnosis not present

## 2024-11-03 DIAGNOSIS — Z794 Long term (current) use of insulin: Secondary | ICD-10-CM | POA: Diagnosis not present

## 2024-11-07 ENCOUNTER — Inpatient Hospital Stay: Admission: RE | Admit: 2024-11-07 | Discharge: 2024-11-07 | Attending: Nephrology | Admitting: Nephrology

## 2024-11-07 DIAGNOSIS — N183 Chronic kidney disease, stage 3 unspecified: Secondary | ICD-10-CM

## 2024-11-07 DIAGNOSIS — N189 Chronic kidney disease, unspecified: Secondary | ICD-10-CM | POA: Diagnosis not present

## 2024-11-11 ENCOUNTER — Other Ambulatory Visit: Payer: Self-pay

## 2024-11-11 ENCOUNTER — Encounter: Payer: Self-pay | Admitting: Sports Medicine

## 2024-11-11 ENCOUNTER — Ambulatory Visit: Admitting: Sports Medicine

## 2024-11-11 DIAGNOSIS — M2142 Flat foot [pes planus] (acquired), left foot: Secondary | ICD-10-CM

## 2024-11-11 DIAGNOSIS — I739 Peripheral vascular disease, unspecified: Secondary | ICD-10-CM | POA: Diagnosis not present

## 2024-11-11 DIAGNOSIS — G8929 Other chronic pain: Secondary | ICD-10-CM | POA: Diagnosis not present

## 2024-11-11 DIAGNOSIS — M216X2 Other acquired deformities of left foot: Secondary | ICD-10-CM | POA: Diagnosis not present

## 2024-11-11 DIAGNOSIS — M2141 Flat foot [pes planus] (acquired), right foot: Secondary | ICD-10-CM | POA: Diagnosis not present

## 2024-11-11 DIAGNOSIS — I998 Other disorder of circulatory system: Secondary | ICD-10-CM

## 2024-11-11 DIAGNOSIS — M79672 Pain in left foot: Secondary | ICD-10-CM | POA: Diagnosis not present

## 2024-11-11 NOTE — Progress Notes (Signed)
 Richard Barton is experiencing left foot pain. Patient says that he has been experiencing this pain for about a year but it is off and on and most recently started again 6 weeks ago. He states that the pain radiates and is dull, stabbing, and a lot of spasms. He points over the top of the foot and lateral aspect of the foot when describing his pain. He uses Voltaren cream and a foot sleeve to help relieve his pain and sometimes they help and sometimes they don't. Patient also says that when he is experiencing the pain in his left foot that it swells. He says that walking and standing helps to loosen it up, but sitting or elevating it makes it become stiff and also hard to put pressure on it. Patient states that his pain level depends on the day but most days its a 12/10. -CR

## 2024-11-11 NOTE — Progress Notes (Signed)
 "  Richard Barton - 68 y.o. male MRN [id number]  Date of birth: [birth year]  Office Visit Note: Visit Date: 11/11/2024 PCP: Vernadine Charlie ORN, MD Referred by: Verta Izetta HERO, NP  Subjective: Chief Complaint  Patient presents with   Left Foot - Pain   HPI: Richard Barton is a pleasant 68 y.o. male who presents today for acute on chronic left foot pain. His wife is present during visit today.  Richard Barton states that he has been experiencing this pain for about a year but it is off and on and most recently started again 6 weeks ago. He states that the pain radiates and is dull, stabbing, and a lot of spasms. He points over the top of the foot and lateral aspect of the foot when describing his pain. He uses Voltaren cream and a foot sleeve to help relieve his pain and sometimes they help and sometimes they don't. Patient also says that when he is experiencing the pain in his left foot that it swells. He says that walking and standing helps to loosen it up, but sitting or elevating it makes it become stiff and also hard to put pressure on it. Patient states that his pain level depends on the day but most days it is quite severe.   Pain characteristics: Sharp at times, chronic aching but also burning symptoms  His pain is most often relieved and feels better when walking, worse when sitting or lying for prolonged periods of time.  *Notable past medical history: - Stage III CHF (HFpEF) - Atrial Flutter - Type II DM (on insulin )  Lab Results  Component Value Date   HGBA1C 6.7 (H) 07/28/2021   Pertinent ROS were reviewed with the patient and found to be negative unless otherwise specified above in HPI.   Assessment & Plan: Visit Diagnoses:  1. Chronic foot pain, left   2. PAD (peripheral artery disease)   3. Vascular calcification   4. Loss of transverse plantar arch of left foot   5. Pes planus of both feet    Plan: Impression is chronic left foot pain which does appear to be twofold in  nature.  He does have bilateral pes planus as well as loss of the longitudinal arch with a degree of metatarsalgia bilaterally.  I believe this is certainly contributing to his pain.  Given this, we did fit him for bilateral green sports insoles with metatarsal padding, patient found these comfortable.  Could consider addition of longitudinal scaphoid pad but will hold on this for now.  He also is having burning sensation and pain that gets better when he walks and is worse at rest.  Did discuss with Leonardo the symptoms seem most indicative of peripheral arterial disease, which I am suspicious for given the significant vascular calcification noted on x-ray as well as his other notable cardiac history.  We will evaluate this further for both the left and the right leg with ABI studies.  He will continue his aspirin  81 mg daily as well as his Crestor  10 mg daily.  Pending on the results, could consider referral to vascular versus starting him on Pletal (100mg  every day - BID), as well as structured walking/exercise plan.  Once his ABI studies result, I will message/call him with next steps.  Follow-up: Return for Dr. Burnetta will reach out once US /ABI test results to discuss next steps.   Meds & Orders: No orders of the defined types were placed in this encounter.   Orders  Placed This Encounter  Procedures   XR Foot Complete Left   VAS US  ABI WITH/WO TBI     Procedures: No procedures performed      Clinical History: No specialty comments available.  He reports that he quit smoking about 17 years ago. His smoking use included cigarettes. He has never used smokeless tobacco. No results for input(s): HGBA1C, LABURIC in the last 8760 hours.  Objective:    Physical Exam  Gen: Well-appearing, in no acute distress; non-toxic CV: Well-perfused. Warm.  Resp: Breathing unlabored on room air; no wheezing. Psych: Fluid speech in conversation; appropriate affect; normal thought process  Ortho Exam -  Bilateral feet: There is mild to moderate loss of longitudinal arch with pes planus.  There is widening of the left greater than right transverse arch.  Skin xerosis noted bilaterally.  I am able to appreciate a dorsalis pedis pulse bilaterally.  Early bunion deformity right > left.  There is trace pedal edema although no significant swelling or ankle effusion.  There is generalized tenderness over the dorsum of the left foot.  Imaging: XR Foot Complete Left Result Date: 11/11/2024 Three-view x-ray of the left foot including AP, oblique, lateral film were ordered and reviewed by myself today.  X-rays demonstrate mild midfoot arthritic change although certainly no advanced arthropathy.  Most notably there is significant vascular calcification noted from the distal tibia through the ankle and foot.  Mild loss of longitudinal arch.  No acute bony abnormality otherwise noted.  Past Medical/Family/Surgical/Social History: Medications & Allergies reviewed per EMR, new medications updated. Patient Active Problem List   Diagnosis Date Noted   Atrial flutter (HCC) 08/02/2021   Benign essential HTN 08/02/2021   Hyperlipidemia 08/02/2021   CKD (chronic kidney disease) stage 3, GFR 30-59 ml/min (HCC) 08/02/2021   Acute on chronic diastolic CHF (congestive heart failure), NYHA class 3 (HCC) 07/28/2021   Abnormal EKG 09/19/2018   Chronic right shoulder pain 02/14/2018   Chronic right-sided low back pain with right-sided sciatica 02/14/2018   Benign neoplasm of heart 08/07/2011   Pleural effusion 08/07/2011   Left Atrial myxoma    Hypertensive heart and chronic kidney disease    DM type 2, goal HbA1c < 7% (HCC)    Renal insufficiency    Past Medical History:  Diagnosis Date   Atrial flutter (HCC)    Atrial myxoma    Left   Benign neoplasm of heart 08/07/2011   Chronic right shoulder pain 02/14/2018   Chronic right-sided low back pain with right-sided sciatica 02/14/2018   Diabetes mellitus type II     DM2 (diabetes mellitus, type 2) (HCC)    HTN (hypertension)    Hyperlipidemia    Renal insufficiency    Unspecified pleural effusion 08/07/2011   Family History  Problem Relation Age of Onset   CAD Father    Past Surgical History:  Procedure Laterality Date   A-FLUTTER ABLATION N/A 07/14/2022   Procedure: A-FLUTTER ABLATION;  Surgeon: Waddell Danelle ORN, MD;  Location: [facility] INVASIVE CV LAB;  Service: Cardiovascular;  Laterality: N/A;   CARDIAC CATHETERIZATION     CARDIOVERSION N/A 08/01/2021   Procedure: CARDIOVERSION;  Surgeon: Pietro Redell RAMAN, MD;  Location: [facility] ENDOSCOPY;  Service: Cardiovascular;  Laterality: N/A;   cardioversion of atrial flutter.  07/31/11   Right miniature thoracotomy for resection of left atrial myxoma.  06/09/11   Dr Dusty   Social History   Occupational History   Occupation: [employer]    Employer: SELF-EMPLOYED  Tobacco  Use   Smoking status: Former    Current packs/day: 0.00    Types: Cigarettes    Quit date: 11/20/2006    Years since quitting: 17.9   Smokeless tobacco: Never  Vaping Use   Vaping status: Never Used  Substance and Sexual Activity   Alcohol use: No   Drug use: No   Sexual activity: Not on file   "

## 2024-11-14 ENCOUNTER — Ambulatory Visit (HOSPITAL_COMMUNITY)
Admission: RE | Admit: 2024-11-14 | Discharge: 2024-11-14 | Disposition: A | Discharge status: Home / Self Care | Source: Ambulatory Visit | Attending: Sports Medicine | Admitting: Sports Medicine

## 2024-11-14 DIAGNOSIS — G8929 Other chronic pain: Secondary | ICD-10-CM | POA: Diagnosis present

## 2024-11-14 DIAGNOSIS — M79672 Pain in left foot: Secondary | ICD-10-CM | POA: Insufficient documentation

## 2024-11-14 DIAGNOSIS — I739 Peripheral vascular disease, unspecified: Secondary | ICD-10-CM | POA: Insufficient documentation

## 2024-11-14 DIAGNOSIS — I998 Other disorder of circulatory system: Secondary | ICD-10-CM | POA: Diagnosis present

## 2024-11-16 LAB — VAS US ABI WITH/WO TBI
Left ABI: 0.99
Right ABI: 1.2

## 2024-11-21 ENCOUNTER — Ambulatory Visit: Payer: Self-pay | Admitting: Sports Medicine

## 2024-11-21 ENCOUNTER — Encounter: Payer: Self-pay | Admitting: Sports Medicine

## 2024-12-02 ENCOUNTER — Ambulatory Visit: Attending: Cardiology | Admitting: Cardiology

## 2024-12-02 ENCOUNTER — Telehealth: Payer: Self-pay

## 2024-12-02 ENCOUNTER — Encounter: Payer: Self-pay | Admitting: Cardiology

## 2024-12-02 VITALS — BP 110/62 | HR 82 | Ht 71.5 in | Wt 215.0 lb

## 2024-12-02 DIAGNOSIS — G4733 Obstructive sleep apnea (adult) (pediatric): Secondary | ICD-10-CM | POA: Diagnosis not present

## 2024-12-02 DIAGNOSIS — I10 Essential (primary) hypertension: Secondary | ICD-10-CM

## 2024-12-02 NOTE — Patient Instructions (Signed)
 Medication Instructions:  Your physician recommends that you continue on your current medications as directed. Please refer to the Current Medication list given to you today.  *If you need a refill on your cardiac medications before your next appointment, please call your pharmacy*  Lab Work: None.  If you have labs (blood work) drawn today and your tests are completely normal, you will receive your results only by: MyChart Message (if you have MyChart) OR A paper copy in the mail If you have any lab test that is abnormal or we need to change your treatment, we will call you to review the results.  Testing/Procedures: None.  Follow-Up: At [facility], you and your health needs are our priority.  As part of our continuing mission to provide you with exceptional heart care, our providers are all part of one team.  This team includes your primary Cardiologist (physician) and Advanced Practice Providers or APPs (Physician Assistants and Nurse Practitioners) who all work together to provide you with the care you need, when you need it.  Your next appointment:   1 year(s)  Provider:   Dr. Wilbert Bihari, MD    We recommend signing up for the patient portal called MyChart.  Sign up information is provided on this After Visit Summary.  MyChart is used to connect with patients for Virtual Visits (Telemedicine).  Patients are able to view lab/test results, encounter notes, upcoming appointments, etc.  Non-urgent messages can be sent to your provider as well.   To learn more about what you can do with MyChart, go to [email or web address].   Other Instructions Dr. Bihari has ordered new supplies for you, your DME should contact you about delivery/pickup.

## 2024-12-02 NOTE — Progress Notes (Signed)
 "  SLEEP MEDICINE OFFICE VISIT NOTE:   Date:  12/02/2024   ID:  Richard Barton, DOB 1956/03/23, MRN [id number]   PCP:  Vernadine Charlie ORN, MD  Cardiologist:  Soyla DELENA Merck, MD    Referring MD: Vernadine Charlie ORN, MD   Chief Complaint  Patient presents with   Sleep Apnea   Hypertension    History of Present Illness:    Richard Barton is a 69 y.o. male with a hx of left atrial myxoma status postresection 2012, atrial flutter, hypertension, hyperlipidemia, diabetes mellitus, CKD.  She also has a history of obstructive sleep apnea on sleep CPAP that was initially followed by Dr. Burnard but he has not been seen in over 2 years and is now referred for sleep medicine evaluation by me upon his retirement  In February 2023 he underwent home sleep study showing severe obstructive sleep apnea with an AHI of 40.2/h and O2 sat nadir 72%.  He had severe snoring.  He underwent in-lab CPAP titration and AutoPap ultimately initiated on auto CPAP with an AirSense 11 AutoSet unit from 7 to 20 cm H2O in March 2023.  On office visit 04/26/2022 his download showed good compliance but suboptimal sleep duration of 5 hours.  95 percentile pressure setting was 14 cm H2O.  His device was changed to auto CPAP from 11 to 20 cm H2O.  He never followed up after that.  He is now back for follow-up.  He is doing well with his PAP device and thinks that he has gotten used to it.  He he tolerates the under the nose full face mask but does not get a good seal due to bushy mustache. He feels the pressure is adequate.  He feels rested in the am but not as well as he used to.  He has no significant daytime sleepiness.  He denies any significant nasal dryness or nasal congestion. He has not tried to adjust the humidity in his device.  He does not think that he snores. An Epworth Sleepiness Scale score was calculated the office today and this endorsed at 4 arguing against residual daytime sleepiness. Patient denies any episodes of  bruxism, restless legs, No hypnogognic hallucinations or cataplectic events.    Past Medical History:  Diagnosis Date   Atrial flutter (HCC)    Atrial myxoma    Left   Benign neoplasm of heart 08/07/2011   Chronic right shoulder pain 02/14/2018   Chronic right-sided low back pain with right-sided sciatica 02/14/2018   Diabetes mellitus type II    DM2 (diabetes mellitus, type 2) (HCC)    HTN (hypertension)    Hyperlipidemia    Renal insufficiency    Unspecified pleural effusion 08/07/2011    Past Surgical History:  Procedure Laterality Date   A-FLUTTER ABLATION N/A 07/14/2022   Procedure: A-FLUTTER ABLATION;  Surgeon: Waddell Danelle ORN, MD;  Location: [facility];  Service: Cardiovascular;  Laterality: N/A;   CARDIAC CATHETERIZATION     CARDIOVERSION N/A 08/01/2021   Procedure: CARDIOVERSION;  Surgeon: Pietro Redell RAMAN, MD;  Location: [facility]OSCOPY;  Service: Cardiovascular;  Laterality: N/A;   cardioversion of atrial flutter.  07/31/11   Right miniature thoracotomy for resection of left atrial myxoma.  06/09/11   Dr Dusty    Current Medications: Active Medications[1]   Allergies:   Patient has no known allergies.   Social History   Socioeconomic History   Marital status: Married    Spouse name: Not on file  Number of children: Not on file   Years of education: Not on file   Highest education level: Not on file  Occupational History   Occupation: Office Manager: SELF-EMPLOYED  Tobacco Use   Smoking status: Former    Current packs/day: 0.00    Types: Cigarettes    Quit date: 11/20/2006    Years since quitting: 18.0   Smokeless tobacco: Never  Vaping Use   Vaping status: Never Used  Substance and Sexual Activity   Alcohol use: No   Drug use: No   Sexual activity: Not on file  Other Topics Concern   Not on file  Social History Narrative   Not on file   Social Drivers of Health   Tobacco Use: Medium Risk (12/02/2024)   Patient History    Smoking Tobacco  Use: Former    Smokeless Tobacco Use: Never    Passive Exposure: Not on Actuary Strain: Not on file  Food Insecurity: Not on file  Transportation Needs: Not on file  Physical Activity: Not on file  Stress: Not on file  Social Connections: Not on file  Depression (EYV7-0): Not on file  Alcohol Screen: Not on file  Housing: Not on file  Utilities: Not on file  Health Literacy: Not on file     Family History: The patient's family history includes CAD in his father.  ROS:   Please see the history of present illness.    ROS  All other systems reviewed and negative.   EKGs/Labs/Other Studies Reviewed:    The following studies were reviewed today: Home sleep study, PAP titration, PAP compliance download   Physical Exam:    VS:  BP 110/62   Pulse 82   Ht 5' 11.5 (1.816 m)   Wt 215 lb (97.5 kg)   SpO2 94%   BMI 29.57 kg/m     Wt Readings from Last 3 Encounters:  12/02/24 215 lb (97.5 kg)  09/22/24 224 lb (101.6 kg)  11/20/23 233 lb 6.4 oz (105.9 kg)     GEN: Well nourished, well developed in no acute distress HEENT: Normal NECK: No JVD; No carotid bruits LYMPHATICS: No lymphadenopathy CARDIAC:RRR, no murmurs, rubs, gallops RESPIRATORY:  Clear to auscultation without rales, wheezing or rhonchi  ABDOMEN: Soft, non-tender, non-distended MUSCULOSKELETAL:  No edema; No deformity  SKIN: Warm and dry NEUROLOGIC:  Alert and oriented x 3 PSYCHIATRIC:  Normal affect  ASSESSMENT:    1. OSA (obstructive sleep apnea)   2. Essential hypertension    PLAN:    In order of problems listed above:  OSA - The patient is tolerating PAP therapy well without any problems. The PAP download performed by his DME was personally reviewed and interpreted by me today and showed an AHI of 1.7 /hr on auto CPAP from 11-20 cm H2O with 97% compliance in using more than 4 hours nightly.  Average use is a 6 hours 32 minutes a day.  95th percentile pressure 12 cm H2O.  The patient  has been using and benefiting from PAP use and will continue to benefit from therapy.  -I will order supplies and new mask of patient choice. -I encouraged him to try to trim up the mustache as it is likely causing mask leakage  Hypertension - BP controlled on exam today - Continue amlodipine  2.5 mg daily, Lotensin  40 mg daily, Bystolic  5 mg daily with as needed refills -I have personally reviewed and interpreted outside labs performed by patient's PCP  which showed SCr 1.62 and K+ 3.9 03/26/2024    Time Spent: 20 minutes total time of encounter, including 15 minutes spent in face-to-face patient care on the date of this encounter. This time includes coordination of care and counseling regarding above mentioned problem list. Remainder of non-face-to-face time involved reviewing chart documents/testing relevant to the patient encounter and documentation in the medical record. I have independently reviewed documentation from referring provider  Medication Adjustments/Labs and Tests Ordered: Current medicines are reviewed at length with the patient today.  Concerns regarding medicines are outlined above.  No orders of the defined types were placed in this encounter.  Followup with me in 1 year  Signed, Wilbert Bihari, MD  12/02/2024 11:41 AM    [facility]     [1]  Current Meds  Medication Sig   amLODipine  (NORVASC ) 2.5 MG tablet Take 1 tablet (2.5 mg total) by mouth daily.   aspirin  EC 81 MG tablet Take 1 tablet (81 mg total) by mouth daily. Swallow whole.   benazepril  (LOTENSIN ) 40 MG tablet Take 1 tablet (40 mg total) by mouth daily.   bimatoprost (LUMIGAN) 0.03 % ophthalmic solution INSTIL 1 DROP INTO AFFECTED EYE ONCE DAILY EVERY EVENING Ophthalmic; Duration: 90 Days   Camphor-Eucalyptus-Menthol (VICKS VAPORUB EX) Apply 1 Application topically daily as needed (Legs and feet).   Continuous Blood Gluc Sensor (FREESTYLE LIBRE 2 SENSOR) MISC    desoximetasone  (TOPICORT) 0.05 % cream Apply 1 Application topically daily as needed (Dry skin).   dorzolamide-timolol (COSOPT) 2-0.5 % ophthalmic solution SMARTSIG:1 Drop(s) In Eye(s) Every 12 Hours   esomeprazole (NEXIUM) 40 MG capsule Take 40 mg by mouth daily before breakfast.     fluticasone (FLONASE) 50 MCG/ACT nasal spray Place 1 spray into both nostrils daily.   fluticasone furoate-vilanterol (BREO ELLIPTA) 100-25 MCG/ACT AEPB Inhale 1 puff into the lungs daily.   HUMALOG KWIKPEN 200 UNIT/ML SOPN Inject 40 Units as directed 2 (two) times daily.   insulin  glargine (LANTUS ) 100 UNIT/ML injection Inject 60 Units into the skin 2 (two) times daily.   Insulin  Pen Needle (BD PEN NEEDLE NANO U/F) 32G X 4 MM MISC Use 7 needles daily with Basaglar  and Humalog. for 100 days   JARDIANCE  10 MG TABS tablet Take 10 mg by mouth daily.   MAGNESIUM PO Take 1 tablet by mouth daily.   metolazone  (ZAROXOLYN ) 2.5 MG tablet Take 2.5 mg by mouth daily as needed (Fluid).   mexiletine (MEXITIL) 150 MG capsule Take 2 capsules (300 mg total) by mouth 2 (two) times daily.   minoxidil  (LONITEN ) 10 MG tablet Take 2 tablets (20 mg total) by mouth 2 (two) times daily.   nebivolol  (BYSTOLIC ) 10 MG tablet Take 0.5 tablets (5 mg total) by mouth daily.   OZEMPIC , 1 MG/DOSE, 4 MG/3ML SOPN Inject 2 mg into the skin once a week.   potassium chloride  SA (KLOR-CON  M20) 20 MEQ tablet Take 1 tablet (20 mEq total) by mouth daily.   rosuvastatin  (CRESTOR ) 10 MG tablet Take 10 mg by mouth daily.   sildenafil (VIAGRA) 100 MG tablet Take 100 mg by mouth as needed.   tamsulosin  (FLOMAX ) 0.4 MG CAPS capsule Take 0.4 mg by mouth at bedtime.   torsemide  (DEMADEX ) 20 MG tablet TAKE 3 TABLETS IN THE MORNING AND 2 TABLET AT NIGHT   "

## 2024-12-02 NOTE — Telephone Encounter (Signed)
 [name]

## 2024-12-08 ENCOUNTER — Telehealth: Payer: Self-pay | Admitting: *Deleted

## 2024-12-08 DIAGNOSIS — I10 Essential (primary) hypertension: Secondary | ICD-10-CM

## 2024-12-08 DIAGNOSIS — G4733 Obstructive sleep apnea (adult) (pediatric): Secondary | ICD-10-CM

## 2024-12-08 DIAGNOSIS — I5032 Chronic diastolic (congestive) heart failure: Secondary | ICD-10-CM

## 2024-12-08 NOTE — Telephone Encounter (Signed)
 DME= [facility]  Order placed to [facility] via community message

## 2024-12-08 NOTE — Telephone Encounter (Signed)
-----   Message from Wilbert Bihari, MD sent at 12/02/2024 11:46 AM EST ----- Order new PAP supplies as well as a new mask of choice - he needs to get it fitted at DME

## 2024-12-22 ENCOUNTER — Ambulatory Visit: Admitting: Sports Medicine
